# Patient Record
Sex: Female | Born: 1940
Health system: Southern US, Community
[De-identification: ages and names within clinical notes are randomized; demographics above are authoritative.]

## PROBLEM LIST (undated history)

## (undated) DIAGNOSIS — J449 Chronic obstructive pulmonary disease, unspecified: Secondary | ICD-10-CM

## (undated) DIAGNOSIS — J439 Emphysema, unspecified: Secondary | ICD-10-CM

## (undated) DIAGNOSIS — I10 Essential (primary) hypertension: Secondary | ICD-10-CM

## (undated) DIAGNOSIS — J45909 Unspecified asthma, uncomplicated: Secondary | ICD-10-CM

## (undated) DIAGNOSIS — E7800 Pure hypercholesterolemia, unspecified: Secondary | ICD-10-CM

## (undated) DIAGNOSIS — M79672 Pain in left foot: Secondary | ICD-10-CM

## (undated) HISTORY — DX: Essential (primary) hypertension: I10

## (undated) HISTORY — DX: Emphysema, unspecified: J43.9

## (undated) HISTORY — DX: Pain in left foot: M79.672

## (undated) HISTORY — PX: GANGLION CYST EXCISION: SHX1691

## (undated) HISTORY — DX: Pure hypercholesterolemia, unspecified: E78.00

## (undated) HISTORY — DX: Unspecified asthma, uncomplicated: J45.909

## (undated) HISTORY — PX: OTHER SURGICAL HISTORY: SHX169

## (undated) HISTORY — DX: Chronic obstructive pulmonary disease, unspecified: J44.9

## (undated) HISTORY — PX: FOOT SURGERY: SHX648

---

## 1997-07-07 ENCOUNTER — Other Ambulatory Visit: Admission: RE | Admit: 1997-07-07 | Discharge: 1997-07-07 | Payer: Self-pay | Admitting: Obstetrics and Gynecology

## 1999-07-20 ENCOUNTER — Other Ambulatory Visit: Admission: RE | Admit: 1999-07-20 | Discharge: 1999-07-20 | Payer: Self-pay | Admitting: Obstetrics and Gynecology

## 2000-08-04 ENCOUNTER — Other Ambulatory Visit: Admission: RE | Admit: 2000-08-04 | Discharge: 2000-08-04 | Payer: Self-pay | Admitting: Obstetrics and Gynecology

## 2001-05-12 ENCOUNTER — Inpatient Hospital Stay (HOSPITAL_COMMUNITY): Admission: AD | Admit: 2001-05-12 | Discharge: 2001-05-14 | Payer: Self-pay | Admitting: *Deleted

## 2001-05-12 ENCOUNTER — Encounter: Payer: Self-pay | Admitting: *Deleted

## 2001-08-29 ENCOUNTER — Encounter: Payer: Self-pay | Admitting: Gastroenterology

## 2001-09-14 ENCOUNTER — Other Ambulatory Visit: Admission: RE | Admit: 2001-09-14 | Discharge: 2001-09-14 | Payer: Self-pay | Admitting: Obstetrics and Gynecology

## 2002-07-09 ENCOUNTER — Other Ambulatory Visit: Admission: RE | Admit: 2002-07-09 | Discharge: 2002-07-09 | Payer: Self-pay | Admitting: Cardiology

## 2004-03-26 ENCOUNTER — Ambulatory Visit: Payer: Self-pay | Admitting: Internal Medicine

## 2004-03-29 ENCOUNTER — Ambulatory Visit: Payer: Self-pay | Admitting: Internal Medicine

## 2004-04-04 ENCOUNTER — Ambulatory Visit: Payer: Self-pay | Admitting: Internal Medicine

## 2004-04-04 ENCOUNTER — Ambulatory Visit (HOSPITAL_COMMUNITY): Admission: RE | Admit: 2004-04-04 | Discharge: 2004-04-04 | Payer: Self-pay | Admitting: Internal Medicine

## 2004-04-12 ENCOUNTER — Encounter: Admission: RE | Admit: 2004-04-12 | Discharge: 2004-07-11 | Payer: Self-pay | Admitting: Internal Medicine

## 2004-06-25 ENCOUNTER — Ambulatory Visit: Payer: Self-pay | Admitting: Internal Medicine

## 2004-06-29 ENCOUNTER — Ambulatory Visit: Payer: Self-pay | Admitting: Internal Medicine

## 2004-10-04 ENCOUNTER — Ambulatory Visit: Payer: Self-pay | Admitting: Internal Medicine

## 2004-10-12 ENCOUNTER — Ambulatory Visit: Payer: Self-pay | Admitting: Internal Medicine

## 2005-02-07 ENCOUNTER — Ambulatory Visit: Payer: Self-pay | Admitting: Internal Medicine

## 2005-03-05 ENCOUNTER — Ambulatory Visit: Payer: Self-pay | Admitting: Internal Medicine

## 2005-07-05 ENCOUNTER — Ambulatory Visit: Payer: Self-pay | Admitting: Internal Medicine

## 2005-08-01 ENCOUNTER — Ambulatory Visit: Payer: Self-pay | Admitting: Internal Medicine

## 2005-08-02 ENCOUNTER — Ambulatory Visit: Payer: Self-pay | Admitting: Internal Medicine

## 2005-08-05 ENCOUNTER — Ambulatory Visit: Payer: Self-pay | Admitting: Internal Medicine

## 2005-10-22 ENCOUNTER — Ambulatory Visit: Payer: Self-pay | Admitting: Endocrinology

## 2005-10-25 ENCOUNTER — Ambulatory Visit: Payer: Self-pay | Admitting: Internal Medicine

## 2005-10-25 ENCOUNTER — Encounter (INDEPENDENT_AMBULATORY_CARE_PROVIDER_SITE_OTHER): Payer: Self-pay | Admitting: *Deleted

## 2005-10-25 ENCOUNTER — Ambulatory Visit (HOSPITAL_COMMUNITY): Admission: RE | Admit: 2005-10-25 | Discharge: 2005-10-25 | Payer: Self-pay | Admitting: Vascular Surgery

## 2005-10-28 ENCOUNTER — Ambulatory Visit: Payer: Self-pay | Admitting: Internal Medicine

## 2005-11-18 ENCOUNTER — Ambulatory Visit: Payer: Self-pay | Admitting: Internal Medicine

## 2006-03-17 ENCOUNTER — Ambulatory Visit: Payer: Self-pay | Admitting: Internal Medicine

## 2006-03-17 LAB — CONVERTED CEMR LAB
ALT: 19 units/L (ref 0–40)
AST: 22 units/L (ref 0–37)
BUN: 17 mg/dL (ref 6–23)
Basophils Absolute: 0 10*3/uL (ref 0.0–0.1)
Basophils Relative: 0.2 % (ref 0.0–1.0)
Chol/HDL Ratio, serum: 2.1
Cholesterol: 125 mg/dL (ref 0–200)
Creatinine, Ser: 0.8 mg/dL (ref 0.4–1.2)
Eosinophil percent: 1.5 % (ref 0.0–5.0)
HCT: 42.3 % (ref 36.0–46.0)
HDL: 60.4 mg/dL (ref >39.0)
Hemoglobin: 14.1 g/dL (ref 12.0–15.0)
Hgb A1c MFr Bld: 6.4 % — ABNORMAL HIGH (ref 4.6–6.0)
LDL Cholesterol: 54 mg/dL (ref 0–99)
Lymphocytes Relative: 32.4 % (ref 12.0–46.0)
MCHC: 33.4 g/dL (ref 30.0–36.0)
MCV: 91.6 fL (ref 78.0–100.0)
Monocytes Absolute: 0.1 10*3/uL — ABNORMAL LOW (ref 0.2–0.7)
Monocytes Relative: 1.9 % — ABNORMAL LOW (ref 3.0–11.0)
Neutro Abs: 2.4 10*3/uL (ref 1.4–7.7)
Neutrophils Relative %: 64 % (ref 43.0–77.0)
Platelets: 194 10*3/uL (ref 150–400)
RBC: 4.61 M/uL (ref 3.87–5.11)
RDW: 13.9 % (ref 11.5–14.6)
Triglyceride fasting, serum: 53 mg/dL (ref 0–149)
VLDL: 11 mg/dL (ref 0–40)
WBC: 3.9 10*3/uL — ABNORMAL LOW (ref 4.5–10.5)

## 2006-04-02 ENCOUNTER — Encounter: Admission: RE | Admit: 2006-04-02 | Discharge: 2006-04-02 | Payer: Self-pay | Admitting: Internal Medicine

## 2006-04-16 ENCOUNTER — Ambulatory Visit: Payer: Self-pay | Admitting: Internal Medicine

## 2006-11-27 ENCOUNTER — Encounter: Payer: Self-pay | Admitting: *Deleted

## 2006-11-27 DIAGNOSIS — H409 Unspecified glaucoma: Secondary | ICD-10-CM | POA: Insufficient documentation

## 2006-11-27 DIAGNOSIS — E119 Type 2 diabetes mellitus without complications: Secondary | ICD-10-CM

## 2006-11-27 DIAGNOSIS — M316 Other giant cell arteritis: Secondary | ICD-10-CM | POA: Insufficient documentation

## 2006-11-27 DIAGNOSIS — I10 Essential (primary) hypertension: Secondary | ICD-10-CM | POA: Insufficient documentation

## 2006-11-27 DIAGNOSIS — E7800 Pure hypercholesterolemia, unspecified: Secondary | ICD-10-CM | POA: Insufficient documentation

## 2006-11-27 DIAGNOSIS — E118 Type 2 diabetes mellitus with unspecified complications: Secondary | ICD-10-CM | POA: Insufficient documentation

## 2006-11-27 HISTORY — DX: Type 2 diabetes mellitus without complications: E11.9

## 2006-11-27 HISTORY — DX: Essential (primary) hypertension: I10

## 2006-11-27 HISTORY — DX: Pure hypercholesterolemia, unspecified: E78.00

## 2006-11-27 HISTORY — DX: Unspecified glaucoma: H40.9

## 2006-11-27 HISTORY — DX: Other giant cell arteritis: M31.6

## 2007-06-22 ENCOUNTER — Telehealth (INDEPENDENT_AMBULATORY_CARE_PROVIDER_SITE_OTHER): Payer: Self-pay | Admitting: *Deleted

## 2007-07-03 ENCOUNTER — Ambulatory Visit: Payer: Self-pay | Admitting: Internal Medicine

## 2007-07-03 LAB — CONVERTED CEMR LAB
Albumin: 3.9 g/dL (ref 3.5–5.2)
BUN: 16 mg/dL (ref 6–23)
Calcium: 9.2 mg/dL (ref 8.4–10.5)
Cholesterol: 119 mg/dL (ref 0–200)
Creatinine, Ser: 0.7 mg/dL (ref 0.4–1.2)
GFR calc non Af Amer: 89 mL/min
HDL: 43.2 mg/dL (ref >39.0)
Hgb A1c MFr Bld: 7.9 % — ABNORMAL HIGH (ref 4.6–6.0)
LDL Cholesterol: 63 mg/dL (ref 0–99)
Total Bilirubin: 0.6 mg/dL (ref 0.3–1.2)
Total CHOL/HDL Ratio: 2.8
Triglycerides: 64 mg/dL (ref 0–149)
VLDL: 13 mg/dL (ref 0–40)

## 2007-07-09 ENCOUNTER — Telehealth: Payer: Self-pay | Admitting: Internal Medicine

## 2007-07-09 ENCOUNTER — Ambulatory Visit: Payer: Self-pay | Admitting: Internal Medicine

## 2007-07-09 DIAGNOSIS — M79609 Pain in unspecified limb: Secondary | ICD-10-CM

## 2007-07-09 HISTORY — DX: Pain in unspecified limb: M79.609

## 2007-07-15 ENCOUNTER — Ambulatory Visit: Payer: Self-pay

## 2007-07-15 ENCOUNTER — Encounter: Payer: Self-pay | Admitting: Internal Medicine

## 2007-07-19 ENCOUNTER — Encounter: Payer: Self-pay | Admitting: Internal Medicine

## 2007-07-22 ENCOUNTER — Encounter: Payer: Self-pay | Admitting: Internal Medicine

## 2007-08-26 ENCOUNTER — Encounter: Payer: Self-pay | Admitting: Internal Medicine

## 2007-10-22 ENCOUNTER — Encounter: Payer: Self-pay | Admitting: Internal Medicine

## 2007-11-26 ENCOUNTER — Encounter: Payer: Self-pay | Admitting: Internal Medicine

## 2007-12-03 ENCOUNTER — Encounter: Admission: RE | Admit: 2007-12-03 | Discharge: 2007-12-03 | Payer: Self-pay | Admitting: Endocrinology

## 2008-02-03 ENCOUNTER — Encounter: Payer: Self-pay | Admitting: Internal Medicine

## 2008-02-09 ENCOUNTER — Ambulatory Visit: Payer: Self-pay | Admitting: Internal Medicine

## 2008-02-09 DIAGNOSIS — M549 Dorsalgia, unspecified: Secondary | ICD-10-CM | POA: Insufficient documentation

## 2008-02-09 HISTORY — DX: Dorsalgia, unspecified: M54.9

## 2008-04-28 ENCOUNTER — Encounter: Payer: Self-pay | Admitting: Internal Medicine

## 2008-05-28 ENCOUNTER — Ambulatory Visit: Payer: Self-pay | Admitting: *Deleted

## 2008-05-28 DIAGNOSIS — K219 Gastro-esophageal reflux disease without esophagitis: Secondary | ICD-10-CM

## 2008-05-28 HISTORY — DX: Gastro-esophageal reflux disease without esophagitis: K21.9

## 2008-05-29 ENCOUNTER — Encounter (INDEPENDENT_AMBULATORY_CARE_PROVIDER_SITE_OTHER): Payer: Self-pay | Admitting: *Deleted

## 2008-05-29 ENCOUNTER — Ambulatory Visit: Payer: Self-pay | Admitting: Internal Medicine

## 2008-05-29 ENCOUNTER — Inpatient Hospital Stay (HOSPITAL_COMMUNITY): Admission: EM | Admit: 2008-05-29 | Discharge: 2008-05-31 | Payer: Self-pay | Admitting: Emergency Medicine

## 2008-05-30 ENCOUNTER — Encounter (INDEPENDENT_AMBULATORY_CARE_PROVIDER_SITE_OTHER): Payer: Self-pay | Admitting: *Deleted

## 2008-06-22 ENCOUNTER — Ambulatory Visit: Payer: Self-pay | Admitting: Internal Medicine

## 2008-06-22 DIAGNOSIS — K859 Acute pancreatitis without necrosis or infection, unspecified: Secondary | ICD-10-CM | POA: Insufficient documentation

## 2008-06-24 ENCOUNTER — Encounter: Payer: Self-pay | Admitting: Internal Medicine

## 2008-07-21 ENCOUNTER — Ambulatory Visit: Payer: Self-pay | Admitting: Internal Medicine

## 2008-07-21 DIAGNOSIS — R109 Unspecified abdominal pain: Secondary | ICD-10-CM

## 2008-07-21 DIAGNOSIS — R5381 Other malaise: Secondary | ICD-10-CM | POA: Insufficient documentation

## 2008-07-21 DIAGNOSIS — R5383 Other fatigue: Secondary | ICD-10-CM | POA: Insufficient documentation

## 2008-07-21 HISTORY — DX: Unspecified abdominal pain: R10.9

## 2008-07-22 ENCOUNTER — Encounter (INDEPENDENT_AMBULATORY_CARE_PROVIDER_SITE_OTHER): Payer: Self-pay | Admitting: *Deleted

## 2008-07-22 LAB — CONVERTED CEMR LAB
ALT: 16 units/L (ref 0–35)
AST: 20 units/L (ref 0–37)
Alkaline Phosphatase: 43 units/L (ref 39–117)
Basophils Absolute: 0 10*3/uL (ref 0.0–0.1)
Bilirubin, Direct: 0.1 mg/dL (ref 0.0–0.3)
CO2: 32 meq/L (ref 19–32)
Chloride: 99 meq/L (ref 96–112)
Creatinine, Ser: 0.6 mg/dL (ref 0.4–1.2)
Eosinophils Relative: 1.3 % (ref 0.0–5.0)
HCT: 39.5 % (ref 36.0–46.0)
Hemoglobin: 13.6 g/dL (ref 12.0–15.0)
Lipase: 27 units/L (ref 11.0–59.0)
Lymphocytes Relative: 35.2 % (ref 12.0–46.0)
Lymphs Abs: 1.5 10*3/uL (ref 0.7–4.0)
Monocytes Relative: 1.8 % — ABNORMAL LOW (ref 3.0–12.0)
Neutro Abs: 2.7 10*3/uL (ref 1.4–7.7)
Platelets: 175 10*3/uL (ref 150.0–400.0)
Potassium: 3.5 meq/L (ref 3.5–5.1)
RDW: 13.1 % (ref 11.5–14.6)
Total Bilirubin: 0.5 mg/dL (ref 0.3–1.2)
Total Protein: 7.3 g/dL (ref 6.0–8.3)
WBC: 4.4 10*3/uL — ABNORMAL LOW (ref 4.5–10.5)

## 2008-08-26 ENCOUNTER — Encounter: Payer: Self-pay | Admitting: Internal Medicine

## 2008-09-16 ENCOUNTER — Encounter: Payer: Self-pay | Admitting: Internal Medicine

## 2008-12-09 ENCOUNTER — Ambulatory Visit: Payer: Self-pay | Admitting: Internal Medicine

## 2008-12-09 DIAGNOSIS — G4710 Hypersomnia, unspecified: Secondary | ICD-10-CM | POA: Insufficient documentation

## 2008-12-09 HISTORY — DX: Hypersomnia, unspecified: G47.10

## 2008-12-21 ENCOUNTER — Ambulatory Visit: Payer: Self-pay | Admitting: Pulmonary Disease

## 2009-01-05 ENCOUNTER — Ambulatory Visit: Payer: Self-pay | Admitting: Gastroenterology

## 2009-01-05 DIAGNOSIS — R198 Other specified symptoms and signs involving the digestive system and abdomen: Secondary | ICD-10-CM | POA: Insufficient documentation

## 2009-01-10 ENCOUNTER — Encounter: Payer: Self-pay | Admitting: Gastroenterology

## 2009-01-10 ENCOUNTER — Ambulatory Visit: Payer: Self-pay | Admitting: Gastroenterology

## 2009-01-11 ENCOUNTER — Ambulatory Visit (HOSPITAL_BASED_OUTPATIENT_CLINIC_OR_DEPARTMENT_OTHER): Admission: RE | Admit: 2009-01-11 | Discharge: 2009-01-11 | Payer: Self-pay | Admitting: Pulmonary Disease

## 2009-01-11 ENCOUNTER — Encounter: Payer: Self-pay | Admitting: Pulmonary Disease

## 2009-01-13 ENCOUNTER — Encounter: Payer: Self-pay | Admitting: Gastroenterology

## 2009-01-23 ENCOUNTER — Ambulatory Visit: Payer: Self-pay | Admitting: Pulmonary Disease

## 2009-01-24 ENCOUNTER — Telehealth (INDEPENDENT_AMBULATORY_CARE_PROVIDER_SITE_OTHER): Payer: Self-pay | Admitting: *Deleted

## 2009-02-03 ENCOUNTER — Ambulatory Visit: Payer: Self-pay | Admitting: Pulmonary Disease

## 2009-02-03 DIAGNOSIS — G4733 Obstructive sleep apnea (adult) (pediatric): Secondary | ICD-10-CM | POA: Insufficient documentation

## 2009-02-03 HISTORY — DX: Obstructive sleep apnea (adult) (pediatric): G47.33

## 2009-03-10 ENCOUNTER — Ambulatory Visit: Payer: Self-pay | Admitting: Pulmonary Disease

## 2009-03-10 ENCOUNTER — Encounter: Payer: Self-pay | Admitting: Internal Medicine

## 2009-03-23 ENCOUNTER — Encounter: Payer: Self-pay | Admitting: Pulmonary Disease

## 2009-04-18 ENCOUNTER — Telehealth: Payer: Self-pay | Admitting: Internal Medicine

## 2009-06-02 ENCOUNTER — Encounter: Payer: Self-pay | Admitting: Internal Medicine

## 2009-06-12 ENCOUNTER — Encounter: Admission: RE | Admit: 2009-06-12 | Discharge: 2009-06-12 | Payer: Self-pay | Admitting: Endocrinology

## 2009-06-20 ENCOUNTER — Ambulatory Visit: Payer: Self-pay | Admitting: Internal Medicine

## 2009-07-07 ENCOUNTER — Ambulatory Visit: Payer: Self-pay | Admitting: Internal Medicine

## 2009-07-14 ENCOUNTER — Encounter: Payer: Self-pay | Admitting: Internal Medicine

## 2009-07-14 ENCOUNTER — Ambulatory Visit: Payer: Self-pay | Admitting: Internal Medicine

## 2009-08-17 ENCOUNTER — Encounter: Payer: Self-pay | Admitting: Internal Medicine

## 2009-08-28 ENCOUNTER — Encounter: Payer: Self-pay | Admitting: Internal Medicine

## 2009-11-24 ENCOUNTER — Ambulatory Visit: Payer: Self-pay | Admitting: Internal Medicine

## 2009-11-24 DIAGNOSIS — J069 Acute upper respiratory infection, unspecified: Secondary | ICD-10-CM | POA: Insufficient documentation

## 2009-12-07 ENCOUNTER — Encounter: Payer: Self-pay | Admitting: Internal Medicine

## 2009-12-11 ENCOUNTER — Telehealth: Payer: Self-pay | Admitting: Internal Medicine

## 2010-03-04 HISTORY — PX: COLONOSCOPY: SHX174

## 2010-03-16 ENCOUNTER — Encounter: Payer: Self-pay | Admitting: Internal Medicine

## 2010-03-20 ENCOUNTER — Encounter: Payer: Self-pay | Admitting: Internal Medicine
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
## 2010-04-05 NOTE — Letter (Signed)
Summary: Reather Littler MD  Reather Littler MD   Imported By: Lester [place] 06/08/2009 08:40:48  _____________________________________________________________________  External Attachment:    Type:   Image     Comment:   External Document

## 2010-04-05 NOTE — Progress Notes (Signed)
Summary: Omeprazole  Phone Note Call from Patient Call back at [phone]   Summary of Call: Patient is requesting to stop omeprazole. She feels she does not need it anymore.  Initial call taken by: Lamar Sprinkles, CMA,  April 18, 2009 3:01 PM  Follow-up for Phone Call        OK to stop. She can use otc zantac 150 as needed. She may experience some rebound reflux with stopping of the Omeprazole Follow-up by: Jacques Navy MD,  April 18, 2009 6:37 PM  Additional Follow-up for Phone Call Additional follow up Details #1::        Patient notified. Additional Follow-up by: Lucious Groves,  April 19, 2009 8:41 AM

## 2010-04-05 NOTE — Miscellaneous (Signed)
Summary: BONE DENSITY  Clinical Lists Changes  Orders: Added new Test order of T-Bone Densitometry (77080) - Signed Added new Test order of T-Lumbar Vertebral Assessment (77082) - Signed 

## 2010-04-05 NOTE — Letter (Signed)
Summary: Alyse Low,  MD  Alyse Low,  MD   Imported By: Lester [place] 03/16/2009 09:59:53  _____________________________________________________________________  External Attachment:    Type:   Image     Comment:   External Document

## 2010-04-05 NOTE — Progress Notes (Signed)
    Immunization History:  Influenza Immunization History:    Influenza:  historical (12/07/2009)

## 2010-04-05 NOTE — Miscellaneous (Signed)
Summary: Flu vax/Ajay Lucianne Muss MD  Flu Sheldon Silvan MD   Imported By: Lester [place] 12/13/2009 10:38:51  _____________________________________________________________________  External Attachment:    Type:   Image     Comment:   External Document

## 2010-04-05 NOTE — Assessment & Plan Note (Signed)
Summary: rov for osa/cpap trial   Primary Provider/Referring Provider:  Jacques Navy,  MD  CC:  Pt is here for a routine f/u appt since starting cpap.  Pt states she is not wearing her cpap machine every night.  Pt states it's "too uncomfortable."  Pt states mask makes her feel "congested."   Pt denied any complaints with pressure. Marland Kitchen  History of Present Illness: The pt comes in today for f/u of her cpap trial for osa.  She has been wearing as much as possible, but admits that she doesn't wear everynight.  She is having issues with having something on her face, but feels that it is getting better the more she wears it.  She also is having issues with too much moisture in the system, but unfortunately her dme did not show her how to adjust heated humidifier.  She does think the cpap helps her sleep and daytime symptoms when she wears it.  Medications Prior to Update: 1)  Amlodipine Besylate 5 Mg Tabs (Amlodipine Besylate) .Marland Kitchen.. 1 By Mouth Once Daily 2)  Aspirin 325 Mg  Tbec (Aspirin) .... Take 1 By Mouth Qd 3)  Lisinopril-Hydrochlorothiazide 20-12.5 Mg Tabs (Lisinopril-Hydrochlorothiazide) .Marland Kitchen.. 1 By Mouth Once Daily 4)  Omeprazole 40 Mg Cpdr (Omeprazole) .Marland Kitchen.. 1 By Mouth Qam For Gerd 5)  Lipitor 20 Mg Tabs (Atorvastatin Calcium) .Marland Kitchen.. 1 By Mouth Q Pm 6)  Glimepiride 1 Mg Tabs (Glimepiride) .... Take 1 By Mouth Qd 7)  Actoplus Met 15-850 Mg Tabs (Pioglitazone Hcl-Metformin Hcl) .... Take 1 Two Times A Day 8)  Xalatan 0.005 % Soln (Latanoprost) .Marland Kitchen.. 1 Drop Each Eye Qhs 9)  Pataday 0.2 % Soln (Olopatadine Hcl) .Marland Kitchen.. 1 Drop in Each Eye As Needed For Allergies 10)  Triple Omega-3-6-9  Caps (Omega 3-6-9 Fatty Acids) .... One Tablet By Mouth Two Times A Day  Allergies (verified): No Known Drug Allergies  Review of Systems      See HPI  Vital Signs:  Patient profile:   70 year old female Height:      61 inches Weight:      177.38 pounds BMI:     33.64 O2 Sat:      92 % on Room air Temp:      98.0 degrees F oral Pulse rate:   96 / minute BP sitting:   132 / 74  (right arm) Cuff size:   regular  Vitals Entered By: Arman Filter LPN (March 10, 2009 2:02 PM)  O2 Flow:  Room air CC: Pt is here for a routine f/u appt since starting cpap.  Pt states she is not wearing her cpap machine every night.  Pt states it's "too uncomfortable."  Pt states mask makes her feel "congested."   Pt denied any complaints with pressure.  Comments Medications reviewed with patient Arman Filter LPN  March 10, 2009 2:02 PM    Physical Exam  General:  ow female in nad Nose:  no skin breakdown or pressure necrosis from cpap mask Neurologic:  alert, but mildly sleepy moves all 4.   Impression & Recommendations:  Problem # 1:  OBSTRUCTIVE SLEEP APNEA (ICD-327.23) the pt is slow to get adjusted to cpap, but would like more time since she feels is getting better.  I would like to try her on the auto mode to see if more comfortable for her, and also to optimize her pressure.  I will also get dme to show her how to operate humidifier.  I have also encouraged her to work on weight loss.  Time spent with pt today was .  Other Orders: Est. Patient Level III ([address]) DME Referral (DME)  Patient Instructions: 1)  keep working on cpap 2)  will use auto mode for the next 2 weeks to see if more comfortable for you. 3)  turn down heat on humidifier to see if helps with less moisture 4)  please call me in 2 weeks and let me know how things are going.

## 2010-04-05 NOTE — Letter (Signed)
   [facility] [address] [place], [place]  [address] Phone: [phone]      August 17, 2009   [facility] [address] [place], [place] [address]  RE:  LAB RESULTS  Dear  Ms. Leeb,  The following is an interpretation of your most recent lab tests.  Please take note of any instructions provided or changes to medications that have resulted from your lab work.     Bone density study revealed normal bone density   Call or e-mail me if you have questions ([email or web address]).   Sincerely Yours,    Jacques Navy MD

## 2010-04-11 ENCOUNTER — Encounter: Payer: Self-pay | Admitting: Internal Medicine

## 2010-04-11 NOTE — Procedures (Signed)
Summary: Colonoscopy / [facility]  Colonoscopy / [facility]   Imported By: Lennie Odor 04/05/2010 13:58:41  _____________________________________________________________________  External Attachment:    Type:   Image     Comment:   External Document

## 2010-04-19 NOTE — Letter (Signed)
Summary: [facility]   Imported By: Sherian Rein 04/12/2010 12:45:36  _____________________________________________________________________  External Attachment:    Type:   Image     Comment:   External Document

## 2010-05-01 NOTE — Letter (Signed)
Summary: Shelly Rubenstein MD  Shelly Rubenstein MD   Imported By: Lester [place] 04/24/2010 07:43:26  _____________________________________________________________________  External Attachment:    Type:   Image     Comment:   External Document

## 2010-06-06 LAB — GLUCOSE, CAPILLARY: Glucose-Capillary: 80 mg/dL (ref 70–99)

## 2010-06-14 LAB — GLUCOSE, CAPILLARY
Glucose-Capillary: 84 mg/dL (ref 70–99)
Glucose-Capillary: 85 mg/dL (ref 70–99)
Glucose-Capillary: 95 mg/dL (ref 70–99)

## 2010-06-14 LAB — LIPID PANEL
Cholesterol: 115 mg/dL (ref 0–200)
LDL Cholesterol: 60 mg/dL (ref 0–99)

## 2010-06-14 LAB — HEMOGLOBIN A1C
Hgb A1c MFr Bld: 6 % (ref 4.6–6.1)
Mean Plasma Glucose: 126 mg/dL

## 2010-06-14 LAB — URINALYSIS, ROUTINE W REFLEX MICROSCOPIC
Glucose, UA: NEGATIVE mg/dL
Ketones, ur: NEGATIVE mg/dL
Leukocytes, UA: NEGATIVE
Nitrite: NEGATIVE
Specific Gravity, Urine: 1.015 (ref 1.005–1.030)
pH: 5 (ref 5.0–8.0)

## 2010-06-14 LAB — URINE MICROSCOPIC-ADD ON

## 2010-06-14 LAB — MAGNESIUM: Magnesium: 1.7 mg/dL (ref 1.5–2.5)

## 2010-06-14 LAB — DIFFERENTIAL
Basophils Relative: 0 % (ref 0–1)
Eosinophils Absolute: 0 10*3/uL (ref 0.0–0.7)
Eosinophils Absolute: 0 10*3/uL (ref 0.0–0.7)
Lymphs Abs: 1.1 10*3/uL (ref 0.7–4.0)
Lymphs Abs: 1.4 10*3/uL (ref 0.7–4.0)
Monocytes Relative: 4 % (ref 3–12)
Monocytes Relative: 5 % (ref 3–12)
Neutro Abs: 4.1 10*3/uL (ref 1.7–7.7)
Neutro Abs: 5.8 10*3/uL (ref 1.7–7.7)
Neutrophils Relative %: 75 % (ref 43–77)
Neutrophils Relative %: 77 % (ref 43–77)

## 2010-06-14 LAB — POCT I-STAT, CHEM 8
Calcium, Ion: 1.13 mmol/L (ref 1.12–1.32)
Chloride: 100 mEq/L (ref 96–112)
Glucose, Bld: 101 mg/dL — ABNORMAL HIGH (ref 70–99)
HCT: 46 % (ref 36.0–46.0)
Hemoglobin: 15.6 g/dL — ABNORMAL HIGH (ref 12.0–15.0)
TCO2: 28 mmol/L (ref 0–100)

## 2010-06-14 LAB — CBC
MCV: 91.8 fL (ref 78.0–100.0)
MCV: 91.8 fL (ref 78.0–100.0)
Platelets: 190 10*3/uL (ref 150–400)
Platelets: 217 10*3/uL (ref 150–400)
RBC: 4.07 MIL/uL (ref 3.87–5.11)
RBC: 4.61 MIL/uL (ref 3.87–5.11)
WBC: 5.5 10*3/uL (ref 4.0–10.5)
WBC: 7.6 10*3/uL (ref 4.0–10.5)

## 2010-06-14 LAB — BASIC METABOLIC PANEL
BUN: 6 mg/dL (ref 6–23)
Calcium: 8.2 mg/dL — ABNORMAL LOW (ref 8.4–10.5)
Chloride: 108 mEq/L (ref 96–112)
Creatinine, Ser: 0.56 mg/dL (ref 0.4–1.2)
GFR calc Af Amer: >60 mL/min (ref >60)
GFR calc non Af Amer: >60 mL/min (ref >60)

## 2010-06-14 LAB — HEPATIC FUNCTION PANEL
ALT: 18 U/L (ref 0–35)
Albumin: 3.9 g/dL (ref 3.5–5.2)
Indirect Bilirubin: 0.5 mg/dL (ref 0.3–0.9)
Total Protein: 7.2 g/dL (ref 6.0–8.3)

## 2010-06-14 LAB — LIPASE, BLOOD: Lipase: 2955 U/L — ABNORMAL HIGH (ref 11–59)

## 2010-06-14 LAB — AMYLASE: Amylase: 1098 U/L — ABNORMAL HIGH (ref 27–131)

## 2010-07-17 NOTE — H&P (Signed)
Angel Tanner, Angel Tanner              ACCOUNT NO.:  [id number]   MEDICAL RECORD NO.:  [id number]          PATIENT TYPE:  INP   LOCATION:  1824                         FACILITY:  [facility]   PHYSICIAN:  Darryl D. Prime, MD    DATE OF BIRTH:  09-25-1940   DATE OF ADMISSION:  05/29/2008  DATE OF DISCHARGE:                              HISTORY & PHYSICAL   The patient is a Full Code.   PRIMARY CARE PHYSICIAN:  Dr. Debby Bud.   CHIEF COMPLAINT:  Abdominal pain.   HISTORY OF PRESENT ILLNESS:  Angel Tanner is a 69 year old female with a  history of diabetes and hypertension.  She has a history of  hyperlipidemia.  She presents with abdominal pain.  She notes mid  abdominal pain since Wednesday, which is 5 days prior to admission,  where initially mild, but she notes it is a cramping sensation with  episodes of severe pain but the pain never goes away.  She notes  radiation of this pain to the upper epigastrium, very severe a few  minutes after eating every single time.  This has gotten to the point  where she has decreased her p.o. intake considerably.  She has had  significant nausea starting on last night with no vomiting.  She notes  one watery diarrhea on Wednesday when the symptoms began but none since,  no fever, no constipation.  In the emergency room patient was found to  have a lipase of 2955 and an amylase of 1098 and was given IV fluids,  morphine and Zofran.  The patient did see someone in clinic on the day  prior to admission and was given medications for reflux.  The patient  notes the last medication that was recently started with Janumet 50/100  twice a day which she has been on for the last 6 months.   PAST MEDICAL HISTORY/PAST SURGICAL HISTORY:  1. Diabetes.  2. She has a history of chest pain syndrome with left heart      catheterization in March 2003 showing nonobstructive disease.  EF      of 69%.  3. History of hypertension.  4. History of glaucoma.  5. History of  hyperlipidemia.  6. She had a C-section 14 years ago.   ALLERGIES:  No known drug allergies.   SOCIAL HISTORY:  Positive for tobacco abuse greater than 40 years 1 pack  per day.  No alcohol or illicit drug use.  She has never drank alcohol.   FAMILY HISTORY:  No family history of GI cancers.   MEDICATIONS:  1. Lisinopril/hydrochlorothiazide 20/12.5 daily.  2. Pataday 0.2% one drop as needed for allergies of the eyes.  3. Amlodipine 5 mg daily.  4. Aspirin 325 mg daily.  5. Lipitor 20 mg daily.  6. Travatan 0.04% one drop to each eye nightly.  7. Kapidex 60 mg, she is taking 2 doses.  8. She is on Janumet 50/100 mg twice a day.   ALLERGIES:  No known drug allergies.   REVIEW OF SYSTEMS:  A 14-point review of systems negative unless stated  above.   PHYSICAL EXAMINATION:  VITAL SIGNS:  Temperature is 98.3 with a pulse  90, respiratory rate of 18, saturations are 98% on room air, blood  pressure 132/88.  GENERAL:  She is a mildly obese sitting upright in bed in no acute  distress.  HEENT:  Normocephalic, atraumatic.  Pupils are equal, round and reactive  to light with extraocular movements being intact.  The oropharynx shows  no posterior oropharyngeal lesion, it is dry.  NECK:  Supple with no lymphadenopathy or thyromegaly.  No carotid  bruits.  ABDOMEN:  Soft with mild tenderness to palpation but normoactive bowel  sounds.  No signs of hepatosplenomegaly.  No rebound tenderness or  guarding.  LUNGS:  Clear to auscultation bilaterally.  CARDIOVASCULAR:  Regular rhythm and rate with no murmurs.  EXTREMITIES:  Show no clubbing, cyanosis or edema.  NEUROLOGIC:  She is alert and oriented x4.  Cranial nerves II-XII  grossly intact.  Strength and sensation grossly intact.  SKIN:  Shows no rash.   LABORATORY DATA:  Shows a sodium of 138 with a potassium of 3.5,  chloride 100, bicarbonate 28, BUN 11, creatinine 1.0, glucose 101.  Hepatic function panel was normal except for the  low alkaline  phosphatase which was 33.  White count is 7.0 with a hemoglobin of 14.2,  hematocrit 42.4, platelets 217 with segs of 77 and lymphocytes 19.  Ultrasound of the abdomen shows no ductal dilatation, no biliary ductal  dilatation.  She had a small hyperechoic focus in the area of the left  kidney consistent with possible angiomyolipoma, there were no signs of  gallbladder disease.  Lipase as above, amylase as above.  Urinalysis  showed for PVCs 3-5, otherwise negative.   ASSESSMENT/PLAN:  This is a patient with a history of diabetes and  hypertension who now presents with pancreatitis of unclear etiology.  She does not drink.  Ultrasound of the abdomen is  benign.  At this time  the potential etiology includes gallbladder sludge, it also includes  medications including hydrochlorothiazide or Janumet, but my major  concern is the Janumet is now causing possible pancreatitis.  At this  time we will have her nothing by mouth and will give intravenous fluids  for bowel rest.  The patient's Janumet will be held.  Will get a CT of  the abdomen to evaluate her anatomy and for possible complications of  pancreatitis.  Will control her pain analgesics and control her nausea  with antiemetics.  The patient, for her diabetes, will be on sliding  scale insulin for now and will give low-dose D5W drip to prevent  ketosis.  For her hypertension, we will order antihypertensives for now.  She does have signs of significant hypovolemia.  Intravenous fluids will  be given for the hypovolemia.  Deep vein thrombosis and gastrointestinal  prophylaxis will be started.      Darryl D. Prime, MD  Electronically Signed     DDP/MEDQ  D:  05/29/2008  T:  05/29/2008  Job:  [id number]

## 2010-07-17 NOTE — Discharge Summary (Signed)
Angel Tanner, Angel Tanner              ACCOUNT NO.:  [id number]   MEDICAL RECORD NO.:  [id number]          PATIENT TYPE:  INP   LOCATION:  5017                         FACILITY:  [facility]   PHYSICIAN:  Valerie A. Felicity Coyer, MDDATE OF BIRTH:  September 15, 1940   DATE OF ADMISSION:  05/29/2008  DATE OF DISCHARGE:  05/31/2008                               DISCHARGE SUMMARY   DISCHARGE DIAGNOSES:  1. Acute pancreatitis likely medication induced other evaluation      negative.  See details below.  Symptoms resolved.  2. Type 2 diabetes, question diet-controlled hemoglobin A1c is 6.0      stopping medications at this time.  See details below.  3. Hypokalemia.  4. Incidental left renal angiolipoma on CT no further evaluation.  5. Hypertension.  6. Dyslipidemia.   DISCHARGE MEDICATIONS:  1. Discontinuation of Janumet 50/1000 p.o. b.i.d.  2. Hold on Lipitor 20 mg p.o. daily until followup with primary MD.   OTHER MEDICATIONS:  As prior to admission and include:  1. Norvasc 5 mg daily.  2. Aspirin 325 mg daily.  3. Travatan 0.004% solution at bedtime.  4. Triamcinolone ointment b.i.d. p.r.n.  5. Lisinopril and hydrochlorothiazide 20/12.5 p.o. daily.  6. Betadine allergy eye drops p.r.n. allergy symptoms.   DISPOSITION:  The patient is discharged home tolerating a regular low-  fat diet without complicating features of pain and has been asymptomatic  for greater than 48 hours.  Hospital followup is with primary care  physician, Dr. Illene Regulus scheduled for Monday June 20, 2008 at  4:20 p.m., also with endocrinologist, Dr. Cindie Laroche as previously scheduled  for further evaluation and review of treatment options of diabetes.   CONDITION ON DISCHARGE:  Medically improved and stable.   HOSPITAL COURSE:  1. Acute pancreatitis.  The patient is a 70 year old woman with      multiple chronic medical issues who recently began Janumet for      treatment of her diabetes.  She had the onset of severe  abdominal      pain which has increased over the 5 days prior to admission but      became persistently constant and severe.  She came for evaluation      in the emergency room and was found to have a lipase level of      nearly 3000 and an amylase level of 1100 consistent with acute      pancreatitis.  She underwent a CT scan of the abdomen and pelvis      which did show inflammatory changes but no complicating features of      acute pancreatitis.  Abdominal ultrasound also performed that      showed no evidence of cholelithiasis or mass.  She was treated with      IV fluids, n.p.o. bowel rest and symptomatic pain medication.  Her      symptoms quickly resolved and by the following day she had no pain      and was ready for a liquid diet which was then advanced to low-fat      diet without recurrence of the pain  symptoms.  Reviewing the      patient's risk factors for acute pancreatitis, she does not drink.      Her triglycerides have been well controlled.  It demonstrated a      fasting lipid profile with triglycerides in the 50s.  No evidence      of trauma and no evidence of stones.  The patient's medications      were then carefully reviewed and though there are several drugs      which may cause pancreatitis symptoms, it was felt that given      chronologic proximity of recent initiation of Janumet that this was      the most likely offending cause and this medication was      subsequently discontinued.  I have also asked her to hold her      Lipitor until further followup with her primary MD.  2. For treatment of her dyslipidemia as her fasting lipid profile      shows good control, we will carefully resume lisinopril and      hydrochlorothiazide at this time noting also that this medication      may be contributing to her pancreatitis symptoms but she has been      on this for a long while without complication until this time.  The      patient has been well controlled regarding  her diabetes with CBGs      under 100 throughout this hospitalization.  The diet has been      advanced and not even in need of sliding scale insulin.  Her A1c is      6.0 albeit on medication prior to this time and question need for      long-term treatment other than diet control of her diabetes.  This      has been carefully reviewed with the patient, need for a low-carb      diet until followup with her endocrinologist as well as low-fat      diet to avoid stimulation of the pancreas in the acute setting.      She is felt medically stable for discharge home having resolved      acute issues with close outpatient followup to be arranged with her      primary MD as I described here.  She has follow up with her      endocrinologist as scheduled. I will bring a copy of her medication      list for followup at that time.   Over 30 minutes on day of discharge coordination for the patient  evaluation, chart review, preparation of meds and diet discussion.      Valerie A. Felicity Coyer, MD  Electronically Signed     VAL/MEDQ  D:  05/31/2008  T:  06/01/2008  Job:  [id number]

## 2010-07-20 NOTE — Discharge Summary (Signed)
[place]  Patient:    MOREEN, PIGGOTT Visit Number: [id number] MRN: [id number]          Service Type: MED Location: [phone] Attending Physician:  Glennon Hamilton Dictated by:   Joellyn Rued, P.A.-C. Admit Date:  05/12/2001 Discharge Date: 05/14/2001   CC:         Rosalyn Gess. Norins, M.D. [facility]   Referring Physician Discharge [place]  DATE OF BIRTH:  April 03, 1941  SUMMARY OF HISTORY:  Ms. Michna is a 70 year old black female who was referred with dyspnea on exertion and precordial tightness associated with exertion over the preceding month.  Prior to these episodes she had been able to exercise 30 minutes on the treadmill without difficulty.  However, recently, with walking short distances or stair climbing she becomes short of breath and has substernal chest discomfort, occasionally radiating to her right elbow. No associated nausea, vomiting, diaphoresis, or dizziness.  Her history includes tobacco use, hypertension, hyperlipidemia, a strong family history, and obesity.  LABORATORY DATA:  Admission chest x-ray showed mild prominence of the ascending aorta, left basilar atelectasis.  H&H was 14.4 and 42.7, normal indices, platelets 190, wbcs 7.0.  Homocysteine 11.16.  TSH 1.017.  Lipids showed total cholesterol 172, triglycerides 51, HDL 49, LDL 113.  Sodium 135, potassium 3.8, BUN 11, creatinine 0.7, glucose slightly elevated at 128.  EKG showed normal sinus rhythm, delayed R wave, nonspecific ST-T wave changes.  HOSPITAL COURSE:  Ms. Miguez was admitted to [facility].  She was placed on IV heparin as well as beta blocker and aspirin.  Overnight she did not have any further chest discomfort and was scheduled for cardiac catheterization.  An ACE inhibitor was added for her blood pressure of 160/90. On March 13 she underwent cardiac catheterization by Dr. Antoine Poche.  This showed an LV pressure of 155/29, aorta pressure 155/88.   She had some luminal irregularities in her LAD; otherwise, she did not have any coronary artery disease.  Her EF was 69% without wall motion abnormalities.  It was felt that her chest discomfort was not related to cardiac ischemia and Dr. Antoine Poche felt that she could be discharged home on her medications prior to admission.  Her chest x-ray was slightly abnormal with a mild prominence of the ascending aorta.  However, Dr. Antoine Poche felt that this did not need further evaluation at this time.  DISCHARGE DIAGNOSES: 1. Chest discomfort of unknown etiology. 2. Hypertension. 3. History as previously.  DISPOSITION:  She is discharged home.  MEDICATIONS:  She is asked to resume: 1. HCTZ 12.5 mg a day. 2. Xalatan eyedrops. 3. Coated aspirin 325 q.d. 4. Ogen as previously.  FOLLOW-UP:  She will follow up with Dr. Debby Bud on May 28, 2001 at 10:30 a.m.  ACTIVITY:  She was advised no lifting, driving, sexual activity, or heavy exertion for two days.  DIET:  Maintain low salt/fat/cholesterol diet.  WOUND CARE:  If she had any problems with her catheterization site she was asked to call.  SPECIAL INSTRUCTIONS:  We also recommended to consider discontinuing smoking. Dictated by:   Joellyn Rued, P.A.-C. Attending Physician:  Glennon Hamilton DD:  05/14/01 TD:  05/14/01 Job: [id number]

## 2010-07-20 NOTE — Cardiovascular Report (Signed)
[place]. [facility]  Patient:    Angel Tanner, Angel Tanner Visit Number: [id number] MRN: [id number]          Service Type: MED Location: [phone] Attending Physician:  Glennon Hamilton Dictated by:   Rollene Rotunda, M.D. [facility] Proc. Date: 05/14/01 Admit Date:  05/12/2001 Discharge Date: 05/14/2001   CC:         Rosalyn Gess. Norins, M.D. [facility]   Cardiac Catheterization  DATE OF BIRTH: [birth year]  PRIMARY PHYSICIAN: Rosalyn Gess. Norins, M.D.  PROCEDURE: Left heart cardiac catheterization/coronary arteriography.  INDICATIONS: Evaluate patient with chest pain suggestive of unstable angina.  DESCRIPTION OF PROCEDURE: Left heart catheterization was performed via the right femoral artery.  The artery was cannulated using an anterior wall puncture.  A #6 French arterial sheath was inserted via the modified Seldinger technique.  Preformed Judkins and a pigtail catheter were utilized.  The patient tolerated the procedure well and left the lab in stable condition.  RESULTS:  HEMODYNAMICS: LV 155/29, AO 155/88.  CORONARY ARTERIOGRAPHY: Left main: The left main was normal.  Left anterior descending: The LAD had luminal irregularities.  Circumflex: The circumflex coronary artery was normal.  Right coronary artery: The right coronary artery was a large dominant vessel. It had luminal irregularities.  LEFT VENTRICULOGRAM: The left ventriculogram was obtained in the RAO projection.  The EF was 65% with normal wall motion.  CONCLUSION: Minimal coronary artery plaquing. Normal left ventricular function.  PLAN: The patient will follow with Dr. Debby Bud. I have discussed the results with him. If she continues to have dyspnea she will have further work-up per Dr. Debby Bud. Dictated by:   Rollene Rotunda, M.D. [facility] Attending Physician:  Glennon Hamilton DD:  05/14/01 TD:  05/15/01 Job: [id number]

## 2010-07-20 NOTE — Assessment & Plan Note (Signed)
[facility]                             PRIMARY CARE OFFICE NOTE   NAME:Angel Tanner, Angel Tanner                     MRN:          [id number]  DATE:10/28/2005                            DOB:          14-Jul-1940    REASON FOR VISIT:  Angel Tanner had an acute episode of illness and was seen  at the [facility] on October 17, 2005.  At that time, there  was concern for pneumonia and possibly temporal arteritis because of fevers,  sweats, chills, myalgia, and temporal headache.  Chest x-ray was done as was  blood work.  The patient had an ESR of 94, a white count that was normal at  10.2.  She was started on Levaquin and prednisone at 40 mg daily.  Final  reading from the radiologist on her chest x-ray was normal and clear, with  no evidence of pneumonia.   The patient returned to [facility] on October 21, 2005.  At that time,  her sed rate was down to 48.  She was told to continue on prednisone for  probable temporal arteritis and was subsequently referred back to [facility] to get scheduled for a temporal artery biopsy.   The patient did see Dr. Romero Belling on October 22, 2005 who did get her set  up with Dr. Edilia Bo for temporal artery biopsy that was performed on October 25, 2005 at [facility] outpatient.  The patient also had had her  prednisone tapered down.  Furthermore, she went to see Dr. Stacey Drain  who was not convinced of the diagnosis on clinical grounds and her reduce  her prednisone further.  Of note, the patient's prednisone had caused her to  have significant hyperglycemia, with a serum glucose on October 22, 2005 of  453.  Dr. Everardo All saw the patient on October 25, 2005 and put her on Actos  with metformin 15/500 t.i.d.  The patient returns now for followup.   The patient reports that she is actually feeling pretty well.  She has had  some tough times when her blood sugar was very high, but that seems  to be  improved.  She has done well with her surgical biopsy, with no sequelae.   I spoke with Dr. Darrick Penna, pathologist, who said that the full report is  pending but she recalls that the specimens showed no evidence of any giant  cell arteritis in either temporal artery biopsy specimen.   PHYSICAL EXAMINATION:  VITAL SIGNS:  Temperature 98.3, blood pressure  120/79, pulse 77.  GENERAL:  This is a well-nourished African American woman who is in no acute  distress.  She has very nice matching scars at the temporal level  bilaterally which look like they are healing well.   ASSESSMENT AND PLAN:  1. Temporal arteritis.  No convincing evidence of temporal arteritis with      a negative temporal artery biopsy bilaterally.  Final pathology report      is pending, but Dr. Darrick Penna was fairly convinced that there was no      evidence  of giant cell arteritis.  Plan:  The patient is to stop      prednisone.  2. Diabetes.  The patient's blood sugars were markedly elevated by the      steroids.  I suspect this will stabilize over time.  For now, she will      continue on the Actos/metformin combination, and samples were provided.      I would like to see her back in two weeks for followup and will      consider readjusting her medications, particularly if her blood sugars      come down under better control.  It is possible that this could      continue for several weeks.  The patient will also continue Januvia at      this time at 100 mg daily.                                   Rosalyn Gess Norins, MD   MEN/MedQ  DD:  10/28/2005  DT:  10/29/2005  Job #:  [id number]   cc:   Aundra Dubin, MD  Sean A. Everardo All, MD  Jerald Kief, M.D.

## 2010-07-20 NOTE — Op Note (Signed)
NAME:  Angel Tanner, Angel Tanner              ACCOUNT NO.:  [id number]   MEDICAL RECORD NO.:  [id number]          PATIENT TYPE:  AMB   LOCATION:  SDS                          FACILITY:  [facility]   PHYSICIAN:  Di Kindle. Edilia Bo, M.D.DATE OF BIRTH:  1941-01-08   DATE OF PROCEDURE:  10/25/2005  DATE OF DISCHARGE:                                 OPERATIVE REPORT   PREOPERATIVE DIAGNOSIS:  Headaches and possible temporal arteritis.   POSTOPERATIVE DIAGNOSIS:  Headaches and possible temporal arteritis.   PROCEDURE:  Bilateral temporal artery biopsies.   SURGEON:  Di Kindle. Edilia Bo, M.D.   ASSISTANT:  Nurse.   ANESTHESIA:  Local with sedation.   TECHNIQUE:  The patient was taken to the operating room and the Doppler was  used to mark the location of both cerebral arteries.  The forehead was  prepped and draped in usual sterile fashion.  With the head turned to the  left, the skin was anesthetized with 1% lidocaine and an oblique incision  was made over the temporal artery and the dissection carried down to the  artery which was controlled with a 3-0 silk tie.  Approximately 3 cm of the  artery was dissected out with branches cauterized.  This was sent to  pathology.  Hemostasis was obtained in the wound.  The wound was closed with  a deep layer of 3-0 Vicryl.  The skin closed with 4-0 Vicryl.  Next, the  patient's head was turned to the right and the skin in the left temple area  was anesthetized.  The oblique incision was made over the temporal artery on  the left and the artery dissected out, ligated at both ends and again  approximately 3 cm segment was excised.  Hemostasis was obtained in the  wound.  The wound was closed with a deep layer of 3-0 Vicryl.  The skin  closed with 4-0 Vicryl.  Sterile dressing was applied.  This was done with  Dermabond.  The patient tolerated the procedure well and was transferred to  the recovery room in satisfactory condition.  All needle and sponge  counts  were correct.      Di Kindle. Edilia Bo, M.D.  Electronically Signed     CSD/MEDQ  D:  10/25/2005  T:  10/25/2005  Job:  [id number]

## 2011-02-04 ENCOUNTER — Other Ambulatory Visit: Payer: Self-pay | Admitting: Internal Medicine

## 2011-02-04 DIAGNOSIS — R229 Localized swelling, mass and lump, unspecified: Secondary | ICD-10-CM

## 2011-02-06 ENCOUNTER — Ambulatory Visit
Admission: RE | Admit: 2011-02-06 | Discharge: 2011-02-06 | Disposition: A | Discharge status: Home / Self Care | Payer: Medicare PPO | Source: Ambulatory Visit | Attending: Internal Medicine | Admitting: Internal Medicine

## 2011-02-06 DIAGNOSIS — R229 Localized swelling, mass and lump, unspecified: Secondary | ICD-10-CM

## 2011-02-06 MED ORDER — GADOBENATE DIMEGLUMINE 529 MG/ML IV SOLN
15.0000 mL | Freq: Once | INTRAVENOUS | Status: AC | PRN
  Administered 2011-02-06: 15 mL via INTRAVENOUS

## 2011-02-19 ENCOUNTER — Other Ambulatory Visit: Payer: Self-pay | Admitting: Orthopedic Surgery

## 2011-02-19 DIAGNOSIS — D1720 Benign lipomatous neoplasm of skin and subcutaneous tissue of unspecified limb: Secondary | ICD-10-CM

## 2011-05-03 ENCOUNTER — Other Ambulatory Visit: Payer: Medicare PPO

## 2011-05-03 HISTORY — PX: LIPOMA EXCISION: SHX5283

## 2011-05-10 ENCOUNTER — Inpatient Hospital Stay
Admission: RE | Admit: 2011-05-10 | Payer: Medicare PPO | Source: Ambulatory Visit | Attending: Orthopedic Surgery | Admitting: Orthopedic Surgery

## 2011-05-10 ENCOUNTER — Ambulatory Visit
Admission: RE | Admit: 2011-05-10 | Discharge: 2011-05-10 | Disposition: A | Discharge status: Home / Self Care | Payer: Medicare Other | Source: Ambulatory Visit | Attending: Internal Medicine | Admitting: Internal Medicine

## 2011-05-10 ENCOUNTER — Other Ambulatory Visit: Payer: Self-pay | Admitting: Internal Medicine

## 2011-05-10 DIAGNOSIS — D1720 Benign lipomatous neoplasm of skin and subcutaneous tissue of unspecified limb: Secondary | ICD-10-CM

## 2011-05-10 MED ORDER — GADOBENATE DIMEGLUMINE 529 MG/ML IV SOLN
15.0000 mL | Freq: Once | INTRAVENOUS | Status: AC | PRN
  Administered 2011-05-10: 15 mL via INTRAVENOUS

## 2011-11-26 ENCOUNTER — Ambulatory Visit (INDEPENDENT_AMBULATORY_CARE_PROVIDER_SITE_OTHER): Payer: Medicare Other | Admitting: Surgery

## 2011-11-26 ENCOUNTER — Encounter (INDEPENDENT_AMBULATORY_CARE_PROVIDER_SITE_OTHER): Payer: Self-pay | Admitting: Surgery

## 2011-11-26 VITALS — BP 124/74 | HR 70 | Temp 96.9°F | Resp 18 | Ht 61.0 in | Wt 175.6 lb

## 2011-11-26 DIAGNOSIS — K645 Perianal venous thrombosis: Secondary | ICD-10-CM

## 2011-11-26 MED ORDER — HYDROCODONE-ACETAMINOPHEN 5-325 MG PO TABS: 1.0000 | ORAL_TABLET | Freq: Four times a day (QID) | ORAL | Status: DC | PRN

## 2011-11-26 NOTE — Patient Instructions (Signed)
Hemorrhoidectomy Care After Hemorrhoidectomy is the removal of enlarged (dilated) veins around the rectum. Until the surgical areas are healed, control of pain and avoiding constipation are the greatest challenges for patients.  For as long as 24 hours after receiving an anesthetic (the medication that made you sleep), and while taking narcotic pain relievers, you may feel dizzy, weak and drowsy. For that reason, the following information applies to the first 24-hour period following surgery, and continues for as long as you are taking narcotic pain medications.  Do not drive a car, ride a bicycle, participate in activities in which you could be hurt. Do not take public transportation until you are off narcotic pain medications and until your caregiver says it is okay.   Do not drink alcohol, take tranquilizers, or medications not prescribed or allowed by your surgical caregiver.   Do not sign important papers or contracts for at least 24 hours or while taking narcotic medications.   Have a responsible person with you for 24 hours.  RISKS AND COMPLICATIONS Some problems that may occur following this procedure include:  Infection. A germ starts growing in the tissue surrounding the site operated on. This can usually be treated with antibiotics.   Damage to the rectal sphincter could occur. This is the muscle that opens in your anus to allow a bowel movement. This could cause incontinence. This is uncommon.   Bleeding following surgery can be a complication of almost any surgery. Your surgeon takes every precaution to keep this from happening.   Complications of anesthesia.  HOME CARE INSTRUCTIONS  Avoid straining when having bowel movements.   Avoid heavy lifting (more than 10 pounds (4.5 kilograms)).   Only take over-the-counter or prescription medicines for pain, discomfort, or fever as directed by your caregiver.   Take hot sitz baths for 20 to 30 minutes, 3 to 4 times per day.   To  keep swelling down, apply an ice pack for twenty minutes three to four times per day between sitz baths. Use a towel between your skin and the ice pack. Do not do this if it causes too much discomfort.   Keep anal area clean and dry. Following a bowel movement, you can gently wash the area with tucks (available for purchase at a drugstore) or cotton swabs. Gently pat the area dry. Do not rub the area.   Eat a well balanced diet and drink 6 to 8 glasses of water every day to avoid constipation. A bulk laxative may be also be helpful.  SEEK MEDICAL CARE IF:   You have increasing pain or tenderness near or in the surgical site.   You are unable to eat or drink.   You develop nausea or vomiting.   You develop uncontrolled bleeding such as soaking two to three pads in one hour.   You have constipation, not helped by changing your diet or increasing your fluid intake. Pain medications are a common cause of constipation.   You have pain and redness (inflammation) extending outside the area of your surgery.   You develop an unexplained oral temperature above 102 F (38.9 C), or any other signs of infection.   You have any other questions or concerns following surgery.  Document Released: 05/11/2003 Document Revised: 02/07/2011 Document Reviewed: 08/08/2008 [facility] Patient Information 2012 [place], [place].

## 2011-11-26 NOTE — Progress Notes (Signed)
Patient ID: LATRESA GASSER, female   DOB: [birth year], 71 y.o.   MRN: [id number]  Chief Complaint  Patient presents with  . Rectal Pain    hem    HPI RIATA IKEDA is a 71 y.o. female.   HPIpatient presents at the request of Dr. Bosie Clos do to perianal swelling, pain and discomfort since Thursday. Pain is severe made worse with sitting. No bleeding or drainage noted.  Past Medical History  Diagnosis Date  . Diabetes mellitus   . Hypertension   . Asthma     No past surgical history on file.  Family History  Problem Relation Age of Onset  . Cancer Mother     lung  . Heart disease Father   . Diabetes Sister     Social History History  Substance Use Topics  . Smoking status: Current Some Day Smoker -- 0.5 packs/day for 45 years    Types: Cigarettes  . Smokeless tobacco: Not on file  . Alcohol Use: Not on file    No Known Allergies  Current Outpatient Prescriptions  Medication Sig Dispense Refill  . albuterol (PROVENTIL HFA;VENTOLIN HFA) 108 (90 BASE) MCG/ACT inhaler Inhale 2 puffs into the lungs every 6 (six) hours as needed.      Marland Kitchen amLODipine (NORVASC) 2.5 MG tablet Take 2.5 mg by mouth daily.      Marland Kitchen aspirin 81 MG chewable tablet Chew 81 mg by mouth daily.      Marland Kitchen atorvastatin (LIPITOR) 10 MG tablet Take 10 mg by mouth daily.      . Calcium Carbonate-Vitamin D (CALCIUM-VITAMIN D) 500-200 MG-UNIT per tablet Take 1 tablet by mouth 2 (two) times daily with a meal.      . glucose blood test strip 1 each by Other route as needed. Use as instructed      . HYDROcodone-acetaminophen (NORCO) 5-325 MG per tablet Take 1 tablet by mouth every 6 (six) hours as needed for pain.  30 tablet  0  . latanoprost (XALATAN) 0.005 % ophthalmic solution 1 drop at bedtime.      Marland Kitchen lisinopril-hydrochlorothiazide (PRINZIDE,ZESTORETIC) 20-12.5 MG per tablet Take 1 tablet by mouth daily.      . pioglitazone-metformin (ACTOPLUS MET) 15-850 MG per tablet Take 1 tablet by mouth 2 (two) times daily  with a meal.        Review of Systems Review of Systems  Constitutional: Negative for fever, chills and unexpected weight change.  HENT: Negative for hearing loss, congestion, sore throat, trouble swallowing and voice change.   Eyes: Negative for visual disturbance.  Respiratory: Negative for cough and wheezing.   Cardiovascular: Negative for chest pain, palpitations and leg swelling.  Gastrointestinal: Positive for rectal pain. Negative for nausea, vomiting, abdominal pain, diarrhea, constipation, blood in stool, abdominal distention and anal bleeding.  Genitourinary: Negative for hematuria, vaginal bleeding and difficulty urinating.  Musculoskeletal: Negative for arthralgias.  Skin: Negative for rash and wound.  Neurological: Negative for seizures, syncope and headaches.  Hematological: Negative for adenopathy. Does not bruise/bleed easily.  Psychiatric/Behavioral: Negative for confusion.    Blood pressure 124/74, pulse 70, temperature 96.9 F (36.1 C), temperature source Temporal, resp. rate 18, height 5\' 1"  (1.549 m), weight 175 lb 9.6 oz (79.652 kg), SpO2 97.00%.  Physical Exam Physical Exam  Constitutional: She appears well-developed and well-nourished.  HENT:  Head: Normocephalic and atraumatic.  Eyes: EOM are normal. Pupils are equal, round, and reactive to light.  Neck: Normal range of motion.  Genitourinary:  Skin: Skin is warm and dry.   Under sterile conditions after discussing the procedure with the patient and getting her consent to do so, anal canal was prepped with Betadine. Large right lateral thrombosed external hemorrhoid injected with 1% lidocaine with epinephrine. Complex was excised. Good hemostasis of pressure. Dry dressing applied. Procedure tolerated well    Assessment    Thrombosed external hemorrhoids    Plan    Excised today in the office. Post excision care instructions given. Vicodin given for pain. Return 10 days. Call with questions or  concerns.       Caria Transue A. 11/26/2011, 4:07 PM

## 2011-12-09 ENCOUNTER — Ambulatory Visit (INDEPENDENT_AMBULATORY_CARE_PROVIDER_SITE_OTHER): Payer: Medicare Other | Admitting: Surgery

## 2011-12-09 ENCOUNTER — Encounter (INDEPENDENT_AMBULATORY_CARE_PROVIDER_SITE_OTHER): Payer: Self-pay | Admitting: Surgery

## 2011-12-09 VITALS — BP 150/78 | HR 72 | Temp 97.1°F | Resp 16 | Ht 61.5 in | Wt 174.8 lb

## 2011-12-09 DIAGNOSIS — Z87898 Personal history of other specified conditions: Secondary | ICD-10-CM

## 2011-12-09 DIAGNOSIS — Z8719 Personal history of other diseases of the digestive system: Secondary | ICD-10-CM

## 2011-12-09 NOTE — Patient Instructions (Signed)
High-Fiber Diet Fiber is found in fruits, vegetables, and grains. A high-fiber diet encourages the addition of more whole grains, legumes, fruits, and vegetables in your diet. The recommended amount of fiber for adult males is 38 g per day. For adult females, it is 25 g per day. Pregnant and lactating women should get 28 g of fiber per day. If you have a digestive or bowel problem, ask your caregiver for advice before adding high-fiber foods to your diet. Eat a variety of high-fiber foods instead of only a select few type of foods.  PURPOSE  To increase stool bulk.  To make bowel movements more regular to prevent constipation.  To lower cholesterol.  To prevent overeating. WHEN IS THIS DIET USED?  It may be used if you have constipation and hemorrhoids.  It may be used if you have uncomplicated diverticulosis (intestine condition) and irritable bowel syndrome.  It may be used if you need help with weight management.  It may be used if you want to add it to your diet as a protective measure against atherosclerosis, diabetes, and cancer. SOURCES OF FIBER  Whole-grain breads and cereals.  Fruits, such as apples, oranges, bananas, berries, prunes, and pears.  Vegetables, such as green peas, carrots, sweet potatoes, beets, broccoli, cabbage, spinach, and artichokes.  Legumes, such split peas, soy, lentils.  Almonds. FIBER CONTENT IN FOODS Starches and Grains / Dietary Fiber (g)  Cheerios, 1 cup / 3 g  Corn Flakes cereal, 1 cup / 0.7 g  Rice crispy treat cereal, 1 cup / 0.3 g  Instant oatmeal (cooked),  cup / 2 g  Frosted wheat cereal, 1 cup / 5.1 g  Brown, long-grain rice (cooked), 1 cup / 3.5 g  White, long-grain rice (cooked), 1 cup / 0.6 g  Enriched macaroni (cooked), 1 cup / 2.5 g Legumes / Dietary Fiber (g)  Baked beans (canned, plain, or vegetarian),  cup / 5.2 g  Kidney beans (canned),  cup / 6.8 g  Pinto beans (cooked),  cup / 5.5 g Breads and Crackers  / Dietary Fiber (g)  Plain or honey graham crackers, 2 squares / 0.7 g  Saltine crackers, 3 squares / 0.3 g  Plain, salted pretzels, 10 pieces / 1.8 g  Whole-wheat bread, 1 slice / 1.9 g  White bread, 1 slice / 0.7 g  Raisin bread, 1 slice / 1.2 g  Plain bagel, 3 oz / 2 g  Flour tortilla, 1 oz / 0.9 g  Corn tortilla, 1 small / 1.5 g  Hamburger or hotdog bun, 1 small / 0.9 g Fruits / Dietary Fiber (g)  Apple with skin, 1 medium / 4.4 g  Sweetened applesauce,  cup / 1.5 g  Banana,  medium / 1.5 g  Grapes, 10 grapes / 0.4 g  Orange, 1 small / 2.3 g  Raisin, 1.5 oz / 1.6 g  Melon, 1 cup / 1.4 g Vegetables / Dietary Fiber (g)  Green beans (canned),  cup / 1.3 g  Carrots (cooked),  cup / 2.3 g  Broccoli (cooked),  cup / 2.8 g  Peas (cooked),  cup / 4.4 g  Mashed potatoes,  cup / 1.6 g  Lettuce, 1 cup / 0.5 g  Corn (canned),  cup / 1.6 g  Tomato,  cup / 1.1 g Document Released: 02/18/2005 Document Revised: 08/20/2011 Document Reviewed: 05/23/2011 [employer] Patient Information 2013 [employer].  

## 2011-12-09 NOTE — Progress Notes (Signed)
Patient returns after excision of thrombosed external hemorrhoids 11 days ago. She feels much better.  Review of systems: Negative  Exam: Anal canal healing well. No signs of infection or recurrence.  Impression: Status post excision of thrombosed external hemorrhoid complex doing well  Plan: High-fiber diet.  Return as needed.

## 2011-12-10 ENCOUNTER — Telehealth (INDEPENDENT_AMBULATORY_CARE_PROVIDER_SITE_OTHER): Payer: Self-pay

## 2011-12-10 NOTE — Telephone Encounter (Signed)
Patient called to add PCP (Dr. Renford Dills) to her Care Team.

## 2012-09-23 ENCOUNTER — Other Ambulatory Visit: Payer: Medicare Other

## 2012-09-30 ENCOUNTER — Ambulatory Visit: Payer: Medicare Other | Admitting: Endocrinology

## 2012-10-19 ENCOUNTER — Other Ambulatory Visit: Payer: Self-pay | Admitting: *Deleted

## 2012-10-19 MED ORDER — LISINOPRIL-HYDROCHLOROTHIAZIDE 20-12.5 MG PO TABS: 1.0000 | ORAL_TABLET | Freq: Every day | ORAL | Status: DC

## 2012-10-19 MED ORDER — PIOGLITAZONE HCL-METFORMIN HCL 15-850 MG PO TABS: 1.0000 | ORAL_TABLET | Freq: Two times a day (BID) | ORAL | Status: DC

## 2013-04-28 ENCOUNTER — Other Ambulatory Visit (HOSPITAL_COMMUNITY): Payer: Self-pay | Admitting: Internal Medicine

## 2013-04-28 DIAGNOSIS — J441 Chronic obstructive pulmonary disease with (acute) exacerbation: Secondary | ICD-10-CM

## 2013-04-28 LAB — PULMONARY FUNCTION TEST
DL/VA % PRED: 92 %
DL/VA: 3.86 ml/min/mmHg/L
DLCO UNC: 8.74 ml/min/mmHg
DLCO unc % pred: 48 %
FEF 25-75 POST: 0.81 L/s
FEF 25-75 PRE: 0.63 L/s
FEF2575-%CHANGE-POST: 27 %
FEF2575-%PRED-PRE: 49 %
FEF2575-%Pred-Post: 63 %
FEV1-%Change-Post: 7 %
FEV1-%PRED-PRE: 79 %
FEV1-%Pred-Post: 85 %
FEV1-POST: 1.16 L
FEV1-PRE: 1.08 L
FEV1FVC-%CHANGE-POST: 4 %
FEV1FVC-%Pred-Pre: 86 %
FEV6-%CHANGE-POST: 3 %
FEV6-%PRED-POST: 98 %
FEV6-%Pred-Pre: 94 %
FEV6-Post: 1.66 L
FEV6-Pre: 1.6 L
FEV6FVC-%Change-Post: 1 %
FEV6FVC-%Pred-Post: 105 %
FEV6FVC-%Pred-Pre: 103 %
FVC-%CHANGE-POST: 2 %
FVC-%PRED-POST: 93 %
FVC-%Pred-Pre: 91 %
FVC-POST: 1.66 L
FVC-Pre: 1.62 L
POST FEV1/FVC RATIO: 70 %
POST FEV6/FVC RATIO: 100 %
PRE FEV1/FVC RATIO: 67 %
Pre FEV6/FVC Ratio: 99 %
RV % pred: 110 %
RV: 2.24 L
TLC % PRED: 92 %
TLC: 4.04 L

## 2013-05-10 ENCOUNTER — Ambulatory Visit (HOSPITAL_COMMUNITY)
Admission: RE | Admit: 2013-05-10 | Discharge: 2013-05-10 | Disposition: A | Discharge status: Home / Self Care | Payer: Medicare Other | Source: Ambulatory Visit | Attending: Internal Medicine | Admitting: Internal Medicine

## 2013-05-10 DIAGNOSIS — J441 Chronic obstructive pulmonary disease with (acute) exacerbation: Secondary | ICD-10-CM | POA: Insufficient documentation

## 2013-05-10 MED ORDER — ALBUTEROL SULFATE (2.5 MG/3ML) 0.083% IN NEBU
2.5000 mg | INHALATION_SOLUTION | Freq: Once | RESPIRATORY_TRACT | Status: AC
  Administered 2013-05-10: 2.5 mg via RESPIRATORY_TRACT

## 2013-09-22 ENCOUNTER — Encounter: Payer: Self-pay | Admitting: *Deleted

## 2013-11-02 ENCOUNTER — Other Ambulatory Visit: Payer: Self-pay | Admitting: Gastroenterology

## 2013-11-02 DIAGNOSIS — R1084 Generalized abdominal pain: Secondary | ICD-10-CM

## 2013-11-04 ENCOUNTER — Ambulatory Visit
Admission: RE | Admit: 2013-11-04 | Discharge: 2013-11-04 | Disposition: A | Discharge status: Home / Self Care | Payer: Medicare Other | Source: Ambulatory Visit | Attending: Gastroenterology | Admitting: Gastroenterology

## 2013-11-04 ENCOUNTER — Other Ambulatory Visit: Payer: Self-pay | Admitting: Gastroenterology

## 2013-11-04 DIAGNOSIS — R911 Solitary pulmonary nodule: Secondary | ICD-10-CM

## 2013-11-04 DIAGNOSIS — R1084 Generalized abdominal pain: Secondary | ICD-10-CM

## 2013-11-04 MED ORDER — IOHEXOL 300 MG/ML  SOLN
100.0000 mL | Freq: Once | INTRAMUSCULAR | Status: AC | PRN
  Administered 2013-11-04: 100 mL via INTRAVENOUS

## 2013-11-12 ENCOUNTER — Ambulatory Visit
Admission: RE | Admit: 2013-11-12 | Discharge: 2013-11-12 | Disposition: A | Discharge status: Home / Self Care | Payer: Medicare Other | Source: Ambulatory Visit | Attending: Gastroenterology | Admitting: Gastroenterology

## 2013-11-12 DIAGNOSIS — R911 Solitary pulmonary nodule: Secondary | ICD-10-CM

## 2013-11-26 ENCOUNTER — Encounter: Payer: Self-pay | Admitting: Emergency Medicine

## 2013-11-26 ENCOUNTER — Ambulatory Visit (INDEPENDENT_AMBULATORY_CARE_PROVIDER_SITE_OTHER): Payer: Medicare Other | Admitting: Emergency Medicine

## 2013-11-26 VITALS — BP 122/88 | HR 80 | Temp 97.2°F | Ht 60.0 in | Wt 181.0 lb

## 2013-11-26 DIAGNOSIS — F17200 Nicotine dependence, unspecified, uncomplicated: Secondary | ICD-10-CM | POA: Insufficient documentation

## 2013-11-26 DIAGNOSIS — R911 Solitary pulmonary nodule: Secondary | ICD-10-CM

## 2013-11-26 DIAGNOSIS — R918 Other nonspecific abnormal finding of lung field: Secondary | ICD-10-CM

## 2013-11-26 HISTORY — DX: Other nonspecific abnormal finding of lung field: R91.8

## 2013-11-26 NOTE — Patient Instructions (Signed)
We will repeat your CT scan of the chest in early December.  Please work on cutting down your smoking. We will talk about setting a quit date at the next visit.  Follow with Dr Lamonte Sakai in December to review your scan.

## 2013-11-26 NOTE — Assessment & Plan Note (Signed)
Incidental finding of a RLL 9mm nodule in a smoker. The size puts it right on the border of PET scan sensitivity. NCCN guidelines recommend a repeat CT scan in 3 months. Will perform this, plan to check PET if it grows. Follow in December.

## 2013-11-26 NOTE — Assessment & Plan Note (Signed)
Discussed cessation today. 

## 2013-11-26 NOTE — Progress Notes (Signed)
Subjective:    Patient ID: Angel Tanner, female    DOB: [birth year], 73 y.o.   MRN: [id number]  HPI 73 yo woman, smoker ( 23 pk-yrs), hx DM, HTN, COPD on albuterol prn. She is referred by [name] to evaluate 66m RLL pulmonary nodule.  The nodule was discovered originally on CT scan abd/pelvis for abdominal pain. Her pain is now resolved. She denies any breathing or chest sx.    Review of Systems  Constitutional: Negative for fever and unexpected weight change.  HENT: Negative for congestion, dental problem, ear pain, nosebleeds, postnasal drip, rhinorrhea, sinus pressure, sneezing, sore throat and trouble swallowing.   Eyes: Negative for redness and itching.  Respiratory: Positive for cough and shortness of breath. Negative for chest tightness and wheezing.   Cardiovascular: Negative for palpitations and leg swelling.  Gastrointestinal: Negative for nausea and vomiting.  Genitourinary: Negative for dysuria.  Musculoskeletal: Negative for joint swelling.  Skin: Negative for rash.  Neurological: Positive for headaches.  Hematological: Does not bruise/bleed easily.  Psychiatric/Behavioral: Negative for dysphoric mood. The patient is not nervous/anxious.    Past Medical History  Diagnosis Date  . Diabetes mellitus   . Hypertension   . Asthma   . Hypercholesteremia   . COPD (chronic obstructive pulmonary disease)   . Emphysema of lung      Family History  Problem Relation Age of Onset  . Cancer Mother     lung  . Heart disease Father   . Diabetes Sister      History   Social History  . Marital Status: Widowed    Spouse Name: N/A    Number of Children: N/A  . Years of Education: N/A   Occupational History  . Not on file.   Social History Main Topics  . Smoking status: Current Some Day Smoker -- 0.50 packs/day for 45 years    Types: Cigarettes  . Smokeless tobacco: Not on file  . Alcohol Use: No  . Drug Use: No  . Sexual Activity: Not on file   Other  Topics Concern  . Not on file   Social History Narrative   Tobacco Use: Smoker for 40+ years 1PPD,    No Alcohol   Caffeine: Yes 2-3 Servings per day   No recreational drug use     No Known Allergies   Outpatient Prescriptions Prior to Visit  Medication Sig Dispense Refill  . albuterol (PROVENTIL HFA;VENTOLIN HFA) 108 (90 BASE) MCG/ACT inhaler Inhale 2 puffs into the lungs every 6 (six) hours as needed.      .Marland KitchenamLODipine (NORVASC) 2.5 MG tablet Take 2.5 mg by mouth daily.      .Marland Kitchenaspirin 81 MG chewable tablet Chew 81 mg by mouth daily.      .Marland Kitchenatorvastatin (LIPITOR) 10 MG tablet Take 10 mg by mouth daily.      . Calcium Carbonate-Vitamin D (CALCIUM-VITAMIN D) 500-200 MG-UNIT per tablet Take 1 tablet by mouth 2 (two) times daily with a meal.      . glucose blood test strip 1 each by Other route as needed. Use as instructed      . latanoprost (XALATAN) 0.005 % ophthalmic solution 1 drop at bedtime.      .Marland Kitchenlisinopril-hydrochlorothiazide (PRINZIDE,ZESTORETIC) 20-12.5 MG per tablet Take 1 tablet by mouth daily.  30 tablet  0  . pioglitazone-metformin (ACTOPLUS MET) 15-850 MG per tablet Take 1 tablet by mouth 2 (two) times daily with a meal.  60 tablet  0  No facility-administered medications prior to visit.         Objective:   Physical Exam Filed Vitals:   11/26/13 1141 11/26/13 1142  BP:  122/88  Pulse:  80  Temp:  97.2 F (36.2 C)  TempSrc:  Oral  Height: 5' (1.524 m) 5' (1.524 m)  Weight: 181 lb (82.101 kg) 181 lb (82.101 kg)  SpO2:  98%    Gen: Pleasant, obese, in no distress,  normal affect  ENT: No lesions,  mouth clear,  oropharynx clear, no postnasal drip  Neck: No JVD, no TMG, no carotid bruits  Lungs: No use of accessory muscles, clear without rales or rhonchi  Cardiovascular: RRR, heart sounds normal, no murmur or gallops, no peripheral edema  Musculoskeletal: No deformities, no cyanosis or clubbing  Neuro: alert, non focal  Skin: Warm, no lesions or  rashes  11/12/13 --  COMPARISON: Abdomen and pelvis CT from 11/04/2013.  FINDINGS:  Soft tissue / Mediastinum: No axillary, mediastinal, or hilar  lymphadenopathy. The heart size is borderline increased. Coronary  artery calcification is noted. No pericardial effusion.  Lungs / Pleura: Subsegmental atelectasis is seen in the right middle  lobe and lingula. 8 mm right lower lobe pulmonary nodule again  noted. No other pulmonary nodule or mass is evident. No focal  airspace consolidation. No pulmonary edema or pleural effusion.  Bones: Bone windows reveal no worrisome lytic or sclerotic osseous  lesions.  Upper Abdomen: Visualized portions of the liver and spleen are  unremarkable.  IMPRESSION:  8 mm right lower lobe pulmonary nodule. No other pulmonary nodules  are evident. If the patient is at high risk for bronchogenic  carcinoma, follow-up chest CT at 3-51month is recommended. If the  patient is at low risk for bronchogenic carcinoma, follow-up chest  CT at 6-12 months is recommended.      Assessment & Plan:  Solitary pulmonary nodule Incidental finding of a RLL 89mnodule in a smoker. The size puts it right on the border of PET scan sensitivity. NCCN guidelines recommend a repeat CT scan in 3 months. Will perform this, plan to check PET if it grows. Follow in December.   Tobacco use disorder Discussed cessation today.

## 2014-02-02 ENCOUNTER — Ambulatory Visit (INDEPENDENT_AMBULATORY_CARE_PROVIDER_SITE_OTHER)
Admission: RE | Admit: 2014-02-02 | Discharge: 2014-02-02 | Disposition: A | Discharge status: Home / Self Care | Payer: Medicare Other | Source: Ambulatory Visit | Attending: Emergency Medicine | Admitting: Emergency Medicine

## 2014-02-02 DIAGNOSIS — R911 Solitary pulmonary nodule: Secondary | ICD-10-CM

## 2014-02-11 ENCOUNTER — Ambulatory Visit: Payer: Medicare Other | Admitting: Internal Medicine

## 2014-02-15 ENCOUNTER — Encounter: Payer: Self-pay | Admitting: Adult Health

## 2014-02-15 ENCOUNTER — Ambulatory Visit (INDEPENDENT_AMBULATORY_CARE_PROVIDER_SITE_OTHER): Payer: Medicare Other | Admitting: Adult Health

## 2014-02-15 VITALS — BP 136/82 | HR 77 | Temp 98.5°F | Ht 61.5 in | Wt 179.0 lb

## 2014-02-15 DIAGNOSIS — R911 Solitary pulmonary nodule: Secondary | ICD-10-CM

## 2014-02-15 NOTE — Patient Instructions (Signed)
Continue to work on waiting smoking. We will set you up for a CT to follow your lung nodule in 6 months Follow with Dr. Lamonte Sakai in 6 months to discuss her CT results

## 2014-02-15 NOTE — Assessment & Plan Note (Signed)
Stable nodule on CT in active smoker  Need to repeat in 6 months  Smoking cessation discussed   Plan  Continue to work on waiting smoking. We will set you up for a CT to follow your lung nodule in 6 months Follow with Dr. Lamonte Sakai in 6 months to discuss her CT results

## 2014-02-15 NOTE — Progress Notes (Signed)
Subjective:    Patient ID: Angel Tanner, female    DOB: 1940/10/17, 73 y.o.   MRN: [id number]  HPI 73 yo woman, smoker ( 23 pk-yrs),  57mm RLL pulmonary nodule.  The nodule was discovered originally on CT scan abd/pelvis for abdominal pain.   02/15/2014 Follow up Lung Nodule  Patient returns for a follow-up visit We have been following a small pulmonary nodule in the right lower lobe. Last CT showed an 8 mm right lower lobe pulmonary nodule Repeat CT on December 2 showed a stable 7 mm right lower lobe pulmonary nodule. We reviewed these test results. Patient continued to smoke, smoking cessation was discussed He denies any chest pain, hemoptysis, orthopnea, PND, weight loss, or leg swelling Previous PFTs in March of this year showed preserved lung function.   Review of Systems  Constitutional:   No  weight loss, night sweats,  Fevers, chills, fatigue, or  lassitude.  HEENT:   No headaches,  Difficulty swallowing,  Tooth/dental problems, or  Sore throat,                No sneezing, itching, ear ache, nasal congestion, post nasal drip,   CV:  No chest pain,  Orthopnea, PND, swelling in lower extremities, anasarca, dizziness, palpitations, syncope.   GI  No heartburn, indigestion, abdominal pain, nausea, vomiting, diarrhea, change in bowel habits, loss of appetite, bloody stools.   Resp: No shortness of breath with exertion or at rest.  No excess mucus, no productive cough,  No non-productive cough,  No coughing up of blood.  No change in color of mucus.  No wheezing.  No chest wall deformity  Skin: no rash or lesions.  GU: no dysuria, change in color of urine, no urgency or frequency.  No flank pain, no hematuria   MS:  No joint pain or swelling.  No decreased range of motion.  No back pain.  Psych:  No change in mood or affect. No depression or anxiety.  No memory loss.         Objective:   Physical Exam  Gen: Pleasant, obese, in no distress,  normal affect  ENT:  No lesions,  mouth clear,  oropharynx clear, no postnasal drip  Neck: No JVD, no TMG, no carotid bruits  Lungs: No use of accessory muscles, clear without rales or rhonchi  Cardiovascular: RRR, heart sounds normal, no murmur or gallops, no peripheral edema  Musculoskeletal: No deformities, no cyanosis or clubbing  Neuro: alert, non focal  Skin: Warm, no lesions or rashes  11/12/13 --  COMPARISON: Abdomen and pelvis CT from 11/04/2013.  FINDINGS:  Soft tissue / Mediastinum: No axillary, mediastinal, or hilar  lymphadenopathy. The heart size is borderline increased. Coronary  artery calcification is noted. No pericardial effusion.  Lungs / Pleura: Subsegmental atelectasis is seen in the right middle  lobe and lingula. 8 mm right lower lobe pulmonary nodule again  noted. No other pulmonary nodule or mass is evident. No focal  airspace consolidation. No pulmonary edema or pleural effusion.  Bones: Bone windows reveal no worrisome lytic or sclerotic osseous  lesions.  Upper Abdomen: Visualized portions of the liver and spleen are  unremarkable.  IMPRESSION:  8 mm right lower lobe pulmonary nodule. No other pulmonary nodules  are evident. If the patient is at high risk for bronchogenic  carcinoma, follow-up chest CT at 3-9months is recommended. If the  patient is at low risk for bronchogenic carcinoma, follow-up chest  CT at 6-12  months is recommended.   02/02/14 Stable 7 mm right lower lobe pulmonary nodule. Continued followup by chest CT recommended in 6-9 months     Assessment & Plan:

## 2014-08-23 ENCOUNTER — Encounter: Payer: Self-pay | Admitting: Gastroenterology

## 2014-08-24 ENCOUNTER — Ambulatory Visit (HOSPITAL_COMMUNITY)
Admission: RE | Admit: 2014-08-24 | Discharge: 2014-08-24 | Disposition: A | Discharge status: Home / Self Care | Payer: PPO | Source: Ambulatory Visit | Attending: Vascular Surgery | Admitting: Vascular Surgery

## 2014-08-24 ENCOUNTER — Other Ambulatory Visit (HOSPITAL_COMMUNITY): Payer: Self-pay | Admitting: Internal Medicine

## 2014-08-24 DIAGNOSIS — I739 Peripheral vascular disease, unspecified: Secondary | ICD-10-CM | POA: Diagnosis present

## 2014-08-26 ENCOUNTER — Encounter: Payer: Self-pay | Admitting: Emergency Medicine

## 2014-08-26 ENCOUNTER — Ambulatory Visit (INDEPENDENT_AMBULATORY_CARE_PROVIDER_SITE_OTHER): Payer: PPO | Admitting: Emergency Medicine

## 2014-08-26 ENCOUNTER — Ambulatory Visit (INDEPENDENT_AMBULATORY_CARE_PROVIDER_SITE_OTHER)
Admission: RE | Admit: 2014-08-26 | Discharge: 2014-08-26 | Disposition: A | Discharge status: Home / Self Care | Payer: PPO | Source: Ambulatory Visit | Attending: Emergency Medicine | Admitting: Emergency Medicine

## 2014-08-26 VITALS — BP 136/70 | HR 89 | Wt 177.0 lb

## 2014-08-26 DIAGNOSIS — I719 Aortic aneurysm of unspecified site, without rupture: Secondary | ICD-10-CM | POA: Diagnosis not present

## 2014-08-26 DIAGNOSIS — Z720 Tobacco use: Secondary | ICD-10-CM

## 2014-08-26 DIAGNOSIS — R918 Other nonspecific abnormal finding of lung field: Secondary | ICD-10-CM | POA: Diagnosis not present

## 2014-08-26 DIAGNOSIS — F17200 Nicotine dependence, unspecified, uncomplicated: Secondary | ICD-10-CM

## 2014-08-26 DIAGNOSIS — R911 Solitary pulmonary nodule: Secondary | ICD-10-CM | POA: Diagnosis not present

## 2014-08-26 NOTE — Patient Instructions (Signed)
Please continue to work hard on decreasing your cigarettes We will refer you to cardiothoracic surgery to follow your enlarged aorta Your right lower lobe pulmonary nodule is stable in size and is almost certainly benign. There is a smaller right upper lobe nodule that will need to be followed. This can likely be done on CAT scan as we follow your aortic aneurysm.  Follow with Dr Lamonte Sakai in 6 months or sooner if you have any problems

## 2014-08-26 NOTE — Progress Notes (Signed)
   Subjective:    Patient ID: Angel Tanner, female    DOB: 1940/09/22, 74 y.o.   MRN: [id number]  HPI 74 yo woman, smoker ( 23 pk-yrs),  36mm RLL pulmonary nodule.  The nodule was discovered originally on CT scan abd/pelvis for abdominal pain.   Follow up Lung Nodule  Patient returns for a follow-up visit We have been following a small pulmonary nodule in the right lower lobe. Last CT showed an 8 mm right lower lobe pulmonary nodule Repeat CT on December 2 showed a stable 7 mm right lower lobe pulmonary nodule. We reviewed these test results. Patient continued to smoke, smoking cessation was discussed He denies any chest pain, hemoptysis, orthopnea, PND, weight loss, or leg swelling Previous PFTs in March of this year showed preserved lung function.  ROV 08/26/14 -- follow-up visit for history of tobacco abuse and a 7-8 mm right lower lobe pulmonary nodule first seen 11/12/13, but in retrospect dating back to 05/30/08. She underwent a repeat CT scan of the chest earlier today and I have personally reviewed this. It shows that her right lower lobe nodule is unchanged in size going all the way back to 2010. She also has a 3 mm right upper lobe nodule that has not changed since 11/12/13.  She also has an enlarged ascending aorta on both scans - has never been formally evaluated for this. She continues to smoke half a pack a day     Objective:   Physical Exam Filed Vitals:   08/26/14 1423  BP: 136/70  Pulse: 89  Weight: 80.287 kg (177 lb)  SpO2: 91%   Gen: Pleasant, obese, in no distress,  normal affect  ENT: No lesions,  mouth clear,  oropharynx clear, no postnasal drip  Neck: No JVD, no TMG, no carotid bruits  Lungs: No use of accessory muscles, clear without rales or rhonchi  Cardiovascular: RRR, heart sounds normal, no murmur or gallops, no peripheral edema  Musculoskeletal: No deformities, no cyanosis or clubbing  Neuro: alert, non focal  Skin: Warm, no lesions or  rashes       Assessment & Plan:   Tobacco use disorder Encouraged cessation  Pulmonary nodules Your right lower lobe pulmonary nodule is stable in size and is almost certainly benign. There is a smaller right upper lobe nodule that will need to be followed. This can likely be done on CAT scan as we follow your aortic aneurysm.  Follow with Dr Lamonte Sakai in 6 months or sooner if you have any problems

## 2014-08-30 ENCOUNTER — Other Ambulatory Visit: Payer: Self-pay

## 2014-08-30 ENCOUNTER — Encounter: Payer: PPO | Admitting: Cardiothoracic Surgery

## 2014-09-02 ENCOUNTER — Institutional Professional Consult (permissible substitution) (INDEPENDENT_AMBULATORY_CARE_PROVIDER_SITE_OTHER): Payer: PPO | Admitting: Cardiothoracic Surgery

## 2014-09-02 ENCOUNTER — Encounter: Payer: Self-pay | Admitting: Cardiothoracic Surgery

## 2014-09-02 VITALS — BP 131/77 | HR 87 | Resp 20 | Ht 61.0 in | Wt 179.0 lb

## 2014-09-02 DIAGNOSIS — I712 Thoracic aortic aneurysm, without rupture, unspecified: Secondary | ICD-10-CM
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
## 2014-09-29 ENCOUNTER — Ambulatory Visit: Payer: Self-pay | Admitting: Emergency Medicine

## 2015-02-17 ENCOUNTER — Encounter: Payer: Self-pay | Admitting: Vascular Surgery

## 2015-02-23 ENCOUNTER — Encounter: Payer: Self-pay | Admitting: Vascular Surgery

## 2015-02-23 ENCOUNTER — Ambulatory Visit (INDEPENDENT_AMBULATORY_CARE_PROVIDER_SITE_OTHER): Payer: PPO | Admitting: Vascular Surgery

## 2015-02-23 VITALS — BP 131/77 | HR 74 | Temp 99.3°F | Resp 16 | Ht 61.5 in | Wt 181.0 lb

## 2015-02-23 DIAGNOSIS — M79604 Pain in right leg: Secondary | ICD-10-CM

## 2015-02-23 DIAGNOSIS — M79605 Pain in left leg: Secondary | ICD-10-CM
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
## 2015-02-24 NOTE — Addendum Note (Signed)
Addended by: Thresa Ross C on: 02/24/2015 10:15 AM   Modules accepted: Orders

## 2015-03-13 ENCOUNTER — Ambulatory Visit: Payer: PPO | Admitting: Emergency Medicine

## 2015-03-14 ENCOUNTER — Ambulatory Visit (INDEPENDENT_AMBULATORY_CARE_PROVIDER_SITE_OTHER): Payer: PPO | Admitting: Emergency Medicine

## 2015-03-14 ENCOUNTER — Encounter: Payer: Self-pay | Admitting: Emergency Medicine

## 2015-03-14 VITALS — BP 110/78 | HR 74 | Ht 61.0 in | Wt 179.0 lb

## 2015-03-14 DIAGNOSIS — G4733 Obstructive sleep apnea (adult) (pediatric): Secondary | ICD-10-CM | POA: Diagnosis not present

## 2015-03-14 DIAGNOSIS — Z1211 Encounter for screening for malignant neoplasm of colon: Secondary | ICD-10-CM | POA: Diagnosis not present

## 2015-03-14 DIAGNOSIS — I712 Thoracic aortic aneurysm, without rupture, unspecified: Secondary | ICD-10-CM

## 2015-03-14 DIAGNOSIS — K219 Gastro-esophageal reflux disease without esophagitis: Secondary | ICD-10-CM | POA: Diagnosis not present

## 2015-03-14 DIAGNOSIS — F17200 Nicotine dependence, unspecified, uncomplicated: Secondary | ICD-10-CM | POA: Diagnosis not present

## 2015-03-14 DIAGNOSIS — R911 Solitary pulmonary nodule: Secondary | ICD-10-CM | POA: Diagnosis not present

## 2015-03-14 NOTE — Assessment & Plan Note (Signed)
Not on CPAP, couldn't tolerate.

## 2015-03-14 NOTE — Assessment & Plan Note (Signed)
We will follow serial scans when she needs to have her TAA evaluated in June 2017. I will see her after to review

## 2015-03-14 NOTE — Assessment & Plan Note (Signed)
We will continue to work on stopping smoking.

## 2015-03-14 NOTE — Progress Notes (Signed)
   Subjective:    Patient ID: Angel Tanner, female    DOB: [birth year], 75 y.o.   MRN: [id number]  HPI 75  yo woman, smoker ( 23 pk-yrs),  79mm RLL pulmonary nodule.  The nodule was discovered originally on CT scan abd/pelvis for abdominal pain.   Follow up Lung Nodule  Patient returns for a follow-up visit We have been following a small pulmonary nodule in the right lower lobe. Last CT showed an 8 mm right lower lobe pulmonary nodule Repeat CT on December 2 showed a stable 7 mm right lower lobe pulmonary nodule. We reviewed these test results. Patient continued to smoke, smoking cessation was discussed He denies any chest pain, hemoptysis, orthopnea, PND, weight loss, or leg swelling Previous PFTs in March of this year showed preserved lung function.  ROV 08/26/14 -- follow-up visit for history of tobacco abuse and a 7-8 mm right lower lobe pulmonary nodule first seen 11/12/13, but in retrospect dating back to 05/30/08. She underwent a repeat CT scan of the chest earlier today and I have personally reviewed this. It shows that her right lower lobe nodule is unchanged in size going all the way back to 2010. She also has a 3 mm right upper lobe nodule that has not changed since 11/12/13.  She also has an enlarged ascending aorta on both scans - has never been formally evaluated for this. She continues to smoke half a pack a day  ROV 03/14/15 -- follow up visit for hx of tobacco, pulmonary nodules as descrbed above.  She was seen by Dr Darcey Nora regarding her aortic aneurysm. He is planning to follow the TAA with serial scans.  She continues to smoke, FEV1 1.08 (79%). She feels that her breathing has been doing well. She has albuterol available, uses it rarely, about 1-2x a week. She had a recent URI that she treated with OTC meds. She is smoking 1/2 pk a day.      Objective:   Physical Exam Filed Vitals:   03/14/15 1503  BP: 110/78  Pulse: 74  Height: 5\' 1"  (1.549 m)  Weight: 179 lb (81.194  kg)  SpO2: 94%   Gen: Pleasant, obese, in no distress,  normal affect  ENT: No lesions,  mouth clear,  oropharynx clear, no postnasal drip  Neck: No JVD, no TMG, no carotid bruits  Lungs: No use of accessory muscles, clear without rales or rhonchi  Cardiovascular: RRR, heart sounds normal, no murmur or gallops, no peripheral edema  Musculoskeletal: No deformities, no cyanosis or clubbing  Neuro: alert, non focal  Skin: Warm, no lesions or rashes       Assessment & Plan:   Solitary pulmonary nodule We will follow serial scans when she needs to have her TAA evaluated in June 2017. I will see her after to review  Tobacco use disorder We will continue to work on stopping smoking.   Obstructive sleep apnea Not on CPAP, couldn't tolerate.

## 2015-03-14 NOTE — Patient Instructions (Signed)
We will repeat your CT chest in June 2017 Continue your albuterol 2 puffs as needed for shortness of breath.  You need to work hard on decreasing your smoking. Ultimately our goal needs to be for you to stop completely Follow with Dr Lamonte Sakai in 6 months or sooner if you have any problems

## 2015-03-15 DIAGNOSIS — M8589 Other specified disorders of bone density and structure, multiple sites: Secondary | ICD-10-CM | POA: Diagnosis not present

## 2015-03-15 DIAGNOSIS — M859 Disorder of bone density and structure, unspecified: Secondary | ICD-10-CM | POA: Diagnosis not present

## 2015-03-17 DIAGNOSIS — K219 Gastro-esophageal reflux disease without esophagitis: Secondary | ICD-10-CM | POA: Diagnosis not present

## 2015-03-17 DIAGNOSIS — Z23 Encounter for immunization: Secondary | ICD-10-CM | POA: Diagnosis not present

## 2015-03-17 DIAGNOSIS — I739 Peripheral vascular disease, unspecified: Secondary | ICD-10-CM | POA: Diagnosis not present

## 2015-03-17 DIAGNOSIS — J449 Chronic obstructive pulmonary disease, unspecified: Secondary | ICD-10-CM | POA: Diagnosis not present

## 2015-03-17 DIAGNOSIS — M25512 Pain in left shoulder: Secondary | ICD-10-CM | POA: Diagnosis not present

## 2015-03-17 DIAGNOSIS — I10 Essential (primary) hypertension: Secondary | ICD-10-CM | POA: Diagnosis not present

## 2015-03-17 DIAGNOSIS — Z7984 Long term (current) use of oral hypoglycemic drugs: Secondary | ICD-10-CM | POA: Diagnosis not present

## 2015-03-17 DIAGNOSIS — E1151 Type 2 diabetes mellitus with diabetic peripheral angiopathy without gangrene: Secondary | ICD-10-CM | POA: Diagnosis not present

## 2015-05-25 ENCOUNTER — Ambulatory Visit (HOSPITAL_COMMUNITY)
Admission: RE | Admit: 2015-05-25 | Discharge: 2015-05-25 | Disposition: A | Discharge status: Home / Self Care | Payer: PPO | Source: Ambulatory Visit | Attending: Vascular Surgery | Admitting: Vascular Surgery

## 2015-05-25 ENCOUNTER — Ambulatory Visit: Payer: PPO | Admitting: Vascular Surgery

## 2015-05-25 ENCOUNTER — Telehealth (HOSPITAL_COMMUNITY): Payer: Self-pay | Admitting: *Deleted

## 2015-05-25 DIAGNOSIS — M79605 Pain in left leg: Principal | ICD-10-CM

## 2015-05-25 DIAGNOSIS — M79604 Pain in right leg: Secondary | ICD-10-CM

## 2015-05-25 NOTE — Telephone Encounter (Signed)
Patient came to her appointment for varicose vein duplex- stated her legs have not bothered her in months and decided the evaluation wasn't necessary as long as she was feeling better. I told her we could cancel her ultrasound appointment today and her follow-up with Dr. Oneida Alar and let her know she could call us at any time in the future should her legs bother her again.

## 2015-06-08 ENCOUNTER — Ambulatory Visit: Payer: PPO | Admitting: Vascular Surgery

## 2015-07-21 DIAGNOSIS — E1151 Type 2 diabetes mellitus with diabetic peripheral angiopathy without gangrene: Secondary | ICD-10-CM | POA: Diagnosis not present

## 2015-07-21 DIAGNOSIS — I10 Essential (primary) hypertension: Secondary | ICD-10-CM | POA: Diagnosis not present

## 2015-07-21 DIAGNOSIS — H6120 Impacted cerumen, unspecified ear: Secondary | ICD-10-CM | POA: Diagnosis not present

## 2015-07-21 DIAGNOSIS — K219 Gastro-esophageal reflux disease without esophagitis: Secondary | ICD-10-CM | POA: Diagnosis not present

## 2015-07-21 DIAGNOSIS — E7800 Pure hypercholesterolemia, unspecified: Secondary | ICD-10-CM | POA: Diagnosis not present

## 2015-07-21 DIAGNOSIS — J441 Chronic obstructive pulmonary disease with (acute) exacerbation: Secondary | ICD-10-CM | POA: Diagnosis not present

## 2015-07-21 DIAGNOSIS — Z7984 Long term (current) use of oral hypoglycemic drugs: Secondary | ICD-10-CM | POA: Diagnosis not present

## 2015-07-21 DIAGNOSIS — I739 Peripheral vascular disease, unspecified: Secondary | ICD-10-CM | POA: Diagnosis not present

## 2015-07-27 DIAGNOSIS — H6121 Impacted cerumen, right ear: Secondary | ICD-10-CM | POA: Diagnosis not present

## 2015-08-07 ENCOUNTER — Other Ambulatory Visit (INDEPENDENT_AMBULATORY_CARE_PROVIDER_SITE_OTHER): Payer: PPO

## 2015-08-07 DIAGNOSIS — R911 Solitary pulmonary nodule: Secondary | ICD-10-CM

## 2015-08-07 DIAGNOSIS — I712 Thoracic aortic aneurysm, without rupture, unspecified: Secondary | ICD-10-CM

## 2015-08-07 LAB — BASIC METABOLIC PANEL
BUN: 19 mg/dL (ref 6–23)
CHLORIDE: 100 meq/L (ref 96–112)
CO2: 33 mEq/L — ABNORMAL HIGH (ref 19–32)
CREATININE: 0.66 mg/dL (ref 0.40–1.20)
Calcium: 9.5 mg/dL (ref 8.4–10.5)
GFR: 112.18 mL/min (ref >60.00)
Glucose, Bld: 161 mg/dL — ABNORMAL HIGH (ref 70–99)
POTASSIUM: 3.8 meq/L (ref 3.5–5.1)
SODIUM: 139 meq/L (ref 135–145)

## 2015-08-09 NOTE — Assessment & Plan Note (Signed)
Encouraged cessation.

## 2015-08-09 NOTE — Assessment & Plan Note (Signed)
Your right lower lobe pulmonary nodule is stable in size and is almost certainly benign. There is a smaller right upper lobe nodule that will need to be followed. This can likely be done on CAT scan as we follow your aortic aneurysm.  Follow with Dr Lamonte Sakai in 6 months or sooner if you have any problems

## 2015-08-14 ENCOUNTER — Other Ambulatory Visit: Payer: Self-pay | Admitting: Emergency Medicine

## 2015-08-14 ENCOUNTER — Ambulatory Visit
Admission: RE | Admit: 2015-08-14 | Discharge: 2015-08-14 | Disposition: A | Discharge status: Home / Self Care | Payer: PPO | Source: Ambulatory Visit | Attending: Emergency Medicine | Admitting: Emergency Medicine

## 2015-08-14 DIAGNOSIS — I712 Thoracic aortic aneurysm, without rupture, unspecified: Secondary | ICD-10-CM

## 2015-08-14 DIAGNOSIS — R911 Solitary pulmonary nodule: Secondary | ICD-10-CM

## 2015-08-22 ENCOUNTER — Ambulatory Visit (INDEPENDENT_AMBULATORY_CARE_PROVIDER_SITE_OTHER)
Admission: RE | Admit: 2015-08-22 | Discharge: 2015-08-22 | Disposition: A | Discharge status: Home / Self Care | Payer: PPO | Source: Ambulatory Visit | Attending: Emergency Medicine | Admitting: Emergency Medicine

## 2015-08-22 DIAGNOSIS — R911 Solitary pulmonary nodule: Secondary | ICD-10-CM

## 2015-08-22 DIAGNOSIS — I712 Thoracic aortic aneurysm, without rupture: Secondary | ICD-10-CM | POA: Diagnosis not present

## 2015-08-22 MED ORDER — IOPAMIDOL (ISOVUE-370) INJECTION 76%
100.0000 mL | Freq: Once | INTRAVENOUS | Status: AC | PRN
  Administered 2015-08-22: 100 mL via INTRAVENOUS

## 2015-08-30 ENCOUNTER — Other Ambulatory Visit: Payer: Self-pay | Admitting: Cardiothoracic Surgery

## 2015-08-30 DIAGNOSIS — I712 Thoracic aortic aneurysm, without rupture, unspecified: Secondary | ICD-10-CM

## 2015-08-31 DIAGNOSIS — H04123 Dry eye syndrome of bilateral lacrimal glands: Secondary | ICD-10-CM | POA: Diagnosis not present

## 2015-08-31 DIAGNOSIS — H25813 Combined forms of age-related cataract, bilateral: Secondary | ICD-10-CM | POA: Diagnosis not present

## 2015-08-31 DIAGNOSIS — H5712 Ocular pain, left eye: Secondary | ICD-10-CM | POA: Diagnosis not present

## 2015-08-31 DIAGNOSIS — H40053 Ocular hypertension, bilateral: Secondary | ICD-10-CM | POA: Diagnosis not present

## 2015-09-18 ENCOUNTER — Ambulatory Visit (INDEPENDENT_AMBULATORY_CARE_PROVIDER_SITE_OTHER): Payer: PPO | Admitting: Emergency Medicine

## 2015-09-18 ENCOUNTER — Encounter: Payer: Self-pay | Admitting: Emergency Medicine

## 2015-09-18 VITALS — BP 126/82 | HR 73 | Ht 61.5 in | Wt 179.0 lb

## 2015-09-18 DIAGNOSIS — J449 Chronic obstructive pulmonary disease, unspecified: Secondary | ICD-10-CM | POA: Diagnosis not present

## 2015-09-18 DIAGNOSIS — F17200 Nicotine dependence, unspecified, uncomplicated: Secondary | ICD-10-CM

## 2015-09-18 DIAGNOSIS — R918 Other nonspecific abnormal finding of lung field: Secondary | ICD-10-CM | POA: Diagnosis not present

## 2015-09-18 HISTORY — DX: Chronic obstructive pulmonary disease, unspecified: J44.9

## 2015-09-18 NOTE — Progress Notes (Signed)
Subjective:    Patient ID: Angel Tanner, female    DOB: 1941-01-04, 75 y.o.   MRN: [id number]  HPI 75  yo woman, smoker ( 23 pk-yrs),  37mm RLL pulmonary nodule.  The nodule was discovered originally on CT scan abd/pelvis for abdominal pain.   Follow up Lung Nodule  Patient returns for a follow-up visit We have been following a small pulmonary nodule in the right lower lobe. Last CT showed an 8 mm right lower lobe pulmonary nodule Repeat CT on December 2 showed a stable 7 mm right lower lobe pulmonary nodule. We reviewed these test results. Patient continued to smoke, smoking cessation was discussed He denies any chest pain, hemoptysis, orthopnea, PND, weight loss, or leg swelling Previous PFTs in March of this year showed preserved lung function.  ROV 08/26/14 -- follow-up visit for history of tobacco abuse and a 7-8 mm right lower lobe pulmonary nodule first seen 11/12/13, but in retrospect dating back to 05/30/08. She underwent a repeat CT scan of the chest earlier today and I have personally reviewed this. It shows that her right lower lobe nodule is unchanged in size going all the way back to 2010. She also has a 3 mm right upper lobe nodule that has not changed since 11/12/13.  She also has an enlarged ascending aorta on both scans - has never been formally evaluated for this. She continues to smoke half a pack a day  ROV 03/14/15 -- follow up visit for hx of tobacco, pulmonary nodules as descrbed above.  She was seen by Dr Darcey Nora regarding her aortic aneurysm. He is planning to follow the TAA with serial scans.  She continues to smoke, FEV1 1.08 (79%). She feels that her breathing has been doing well. She has albuterol available, uses it rarely, about 1-2x a week. She had a recent URI that she treated with OTC meds. She is smoking 1/2 pk a day.   ROV 09/18/15 -- patient has a history of tobacco use and COPD. She also has pulmonary nodules: An 8 mm right lower lobe nodule that dates back  to 2010, a 3 mm right upper lobe nodule noted 11/12/13. She had a CT scan of the chest on 08/22/15 to follow these nodules and also her thoracic aorta ( noted be stable compared with last years exam). I reviewed this today. The right lower lobe nodule shows continued stability. The right upper lobe nodule is 3 mm and has not changed (almost 2 years of stability). She has cut her cigarettes down some, now less than half a pack a day. She is active, does not have much dyspnea except with stairs. She is interested in cutting down her cigarettes. She uses albuterol rarely, about 2x a month when she hears wheeze. Has a daily cough, sometimes productive.      Objective:   Physical Exam Filed Vitals:   09/18/15 1334  BP: 126/82  Pulse: 73  Height: 5' 1.5" (1.562 m)  Weight: 179 lb (81.194 kg)  SpO2: 95%   Gen: Pleasant, obese woman, in no distress,  normal affect  ENT: No lesions,  mouth clear,  oropharynx clear, no postnasal drip  Neck: No JVD, no TMG, no carotid bruits  Lungs: No use of accessory muscles, small volumes, clear without rales or rhonchi  Cardiovascular: RRR, heart sounds normal, no murmur or gallops, no peripheral edema  Musculoskeletal: No deformities, no cyanosis or clubbing  Neuro: alert, non focal  Skin: Warm, no lesions or rashes  Assessment & Plan:   Pulmonary nodules 7-8 mm right lower lobe nodule is unchanged and is almost certainly benign. 3 mm right upper lobe nodule is stable for almost 2 years. We will follow annually in coordination with her CT scan for her thoracic aortic aneurysm.  Tobacco use disorder Discussed cessation today. She would like to cut down and we talked about techniques to accomplish this. She is not ready to set a quit date  COPD (chronic obstructive pulmonary disease) (HCC) Continue albuterol when necessary as her only bronchodilator. She occasionally has used Spiriva for a day but I have encouraged her to stop this since we have  not committed to maintenance therapy at this time.     Baltazar Apo, MD, PhD 09/18/2015, 2:09 PM [facility] [phone] or if no answer [phone]

## 2015-09-18 NOTE — Assessment & Plan Note (Signed)
Continue albuterol when necessary as her only bronchodilator. She occasionally has used Spiriva for a day but I have encouraged her to stop this since we have not committed to maintenance therapy at this time.

## 2015-09-18 NOTE — Patient Instructions (Signed)
Pulmonary nodules are stable compared with the prior CT scan of the chest. We will repeat this when it's time to follow your thoracic aortic aneurysm with Dr. Darcey Nora.  Take albuterol 2 puffs up to every 4 hours if needed for shortness of breath.  Continue to work on decreasing smoking Follow with Dr Lamonte Sakai in 12 months or sooner if you have any problems

## 2015-09-18 NOTE — Assessment & Plan Note (Signed)
7-8 mm right lower lobe nodule is unchanged and is almost certainly benign. 3 mm right upper lobe nodule is stable for almost 2 years. We will follow annually in coordination with her CT scan for her thoracic aortic aneurysm.

## 2015-09-18 NOTE — Assessment & Plan Note (Signed)
Discussed cessation today. She would like to cut down and we talked about techniques to accomplish this. She is not ready to set a quit date

## 2015-09-27 ENCOUNTER — Encounter: Payer: Self-pay | Admitting: Cardiothoracic Surgery

## 2015-09-27 ENCOUNTER — Other Ambulatory Visit: Payer: PPO

## 2015-09-27 ENCOUNTER — Ambulatory Visit (INDEPENDENT_AMBULATORY_CARE_PROVIDER_SITE_OTHER): Payer: PPO | Admitting: Cardiothoracic Surgery

## 2015-09-27 VITALS — BP 140/90 | HR 76 | Resp 20 | Ht 61.5 in | Wt 179.0 lb

## 2015-09-27 DIAGNOSIS — R911 Solitary pulmonary nodule: Secondary | ICD-10-CM | POA: Diagnosis not present

## 2015-09-27 DIAGNOSIS — I712 Thoracic aortic aneurysm, without rupture, unspecified: Secondary | ICD-10-CM
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
## 2015-10-09 DIAGNOSIS — E2749 Other adrenocortical insufficiency: Secondary | ICD-10-CM | POA: Diagnosis not present

## 2015-10-09 DIAGNOSIS — E038 Other specified hypothyroidism: Secondary | ICD-10-CM | POA: Diagnosis not present

## 2015-10-09 DIAGNOSIS — E2839 Other primary ovarian failure: Secondary | ICD-10-CM | POA: Diagnosis not present

## 2015-10-11 DIAGNOSIS — E018 Other iodine-deficiency related thyroid disorders and allied conditions: Secondary | ICD-10-CM | POA: Diagnosis not present

## 2015-10-11 DIAGNOSIS — E663 Overweight: Secondary | ICD-10-CM | POA: Diagnosis not present

## 2015-10-11 DIAGNOSIS — N951 Menopausal and female climacteric states: Secondary | ICD-10-CM | POA: Diagnosis not present

## 2015-10-11 DIAGNOSIS — L632 Ophiasis: Secondary | ICD-10-CM | POA: Diagnosis not present

## 2015-10-11 DIAGNOSIS — E2839 Other primary ovarian failure: Secondary | ICD-10-CM | POA: Diagnosis not present

## 2015-10-13 ENCOUNTER — Other Ambulatory Visit: Payer: Self-pay | Admitting: Gastroenterology

## 2015-10-13 DIAGNOSIS — K219 Gastro-esophageal reflux disease without esophagitis: Secondary | ICD-10-CM | POA: Diagnosis not present

## 2015-10-13 DIAGNOSIS — R1084 Generalized abdominal pain: Secondary | ICD-10-CM | POA: Diagnosis not present

## 2015-10-13 DIAGNOSIS — R1310 Dysphagia, unspecified: Secondary | ICD-10-CM | POA: Diagnosis not present

## 2015-10-13 DIAGNOSIS — R140 Abdominal distension (gaseous): Secondary | ICD-10-CM | POA: Diagnosis not present

## 2015-10-13 DIAGNOSIS — IMO0001 Reserved for inherently not codable concepts without codable children: Secondary | ICD-10-CM

## 2015-10-24 DIAGNOSIS — R350 Frequency of micturition: Secondary | ICD-10-CM | POA: Diagnosis not present

## 2015-10-24 DIAGNOSIS — R1030 Lower abdominal pain, unspecified: Secondary | ICD-10-CM | POA: Diagnosis not present

## 2015-10-26 ENCOUNTER — Ambulatory Visit
Admission: RE | Admit: 2015-10-26 | Discharge: 2015-10-26 | Disposition: A | Discharge status: Home / Self Care | Payer: PPO | Source: Ambulatory Visit | Attending: Gastroenterology | Admitting: Gastroenterology

## 2015-10-26 DIAGNOSIS — R1084 Generalized abdominal pain: Secondary | ICD-10-CM

## 2015-10-26 DIAGNOSIS — IMO0001 Reserved for inherently not codable concepts without codable children: Secondary | ICD-10-CM

## 2015-10-26 DIAGNOSIS — R102 Pelvic and perineal pain: Secondary | ICD-10-CM | POA: Diagnosis not present

## 2015-10-26 DIAGNOSIS — R109 Unspecified abdominal pain: Secondary | ICD-10-CM | POA: Diagnosis not present

## 2015-11-08 DIAGNOSIS — I712 Thoracic aortic aneurysm, without rupture: Secondary | ICD-10-CM | POA: Diagnosis not present

## 2015-11-08 DIAGNOSIS — Z23 Encounter for immunization: Secondary | ICD-10-CM | POA: Diagnosis not present

## 2015-11-08 DIAGNOSIS — E7800 Pure hypercholesterolemia, unspecified: Secondary | ICD-10-CM | POA: Diagnosis not present

## 2015-11-08 DIAGNOSIS — Z0000 Encounter for general adult medical examination without abnormal findings: Secondary | ICD-10-CM | POA: Diagnosis not present

## 2015-11-08 DIAGNOSIS — E039 Hypothyroidism, unspecified: Secondary | ICD-10-CM | POA: Diagnosis not present

## 2015-11-08 DIAGNOSIS — Z7984 Long term (current) use of oral hypoglycemic drugs: Secondary | ICD-10-CM | POA: Diagnosis not present

## 2015-11-08 DIAGNOSIS — J449 Chronic obstructive pulmonary disease, unspecified: Secondary | ICD-10-CM | POA: Diagnosis not present

## 2015-11-08 DIAGNOSIS — I10 Essential (primary) hypertension: Secondary | ICD-10-CM | POA: Diagnosis not present

## 2015-11-08 DIAGNOSIS — Z1389 Encounter for screening for other disorder: Secondary | ICD-10-CM | POA: Diagnosis not present

## 2015-11-08 DIAGNOSIS — K219 Gastro-esophageal reflux disease without esophagitis: Secondary | ICD-10-CM | POA: Diagnosis not present

## 2015-11-08 DIAGNOSIS — E119 Type 2 diabetes mellitus without complications: Secondary | ICD-10-CM | POA: Diagnosis not present

## 2015-11-21 DIAGNOSIS — E038 Other specified hypothyroidism: Secondary | ICD-10-CM | POA: Diagnosis not present

## 2015-11-21 DIAGNOSIS — E2749 Other adrenocortical insufficiency: Secondary | ICD-10-CM | POA: Diagnosis not present

## 2015-11-21 DIAGNOSIS — E2839 Other primary ovarian failure: Secondary | ICD-10-CM | POA: Diagnosis not present

## 2015-11-23 DIAGNOSIS — Z1231 Encounter for screening mammogram for malignant neoplasm of breast: Secondary | ICD-10-CM | POA: Diagnosis not present

## 2015-11-23 DIAGNOSIS — Z803 Family history of malignant neoplasm of breast: Secondary | ICD-10-CM | POA: Diagnosis not present

## 2015-11-29 DIAGNOSIS — N951 Menopausal and female climacteric states: Secondary | ICD-10-CM | POA: Diagnosis not present

## 2015-11-29 DIAGNOSIS — E663 Overweight: Secondary | ICD-10-CM | POA: Diagnosis not present

## 2015-11-29 DIAGNOSIS — E2839 Other primary ovarian failure: Secondary | ICD-10-CM | POA: Diagnosis not present

## 2015-11-29 DIAGNOSIS — L638 Other alopecia areata: Secondary | ICD-10-CM | POA: Diagnosis not present

## 2015-11-29 DIAGNOSIS — E038 Other specified hypothyroidism: Secondary | ICD-10-CM | POA: Diagnosis not present

## 2016-01-18 DIAGNOSIS — R0789 Other chest pain: Secondary | ICD-10-CM | POA: Diagnosis not present

## 2016-01-18 DIAGNOSIS — R946 Abnormal results of thyroid function studies: Secondary | ICD-10-CM | POA: Diagnosis not present

## 2016-01-18 DIAGNOSIS — N644 Mastodynia: Secondary | ICD-10-CM | POA: Diagnosis not present

## 2016-02-20 DIAGNOSIS — N644 Mastodynia: Secondary | ICD-10-CM | POA: Diagnosis not present

## 2016-03-01 DIAGNOSIS — H02834 Dermatochalasis of left upper eyelid: Secondary | ICD-10-CM | POA: Diagnosis not present

## 2016-03-01 DIAGNOSIS — H25813 Combined forms of age-related cataract, bilateral: Secondary | ICD-10-CM | POA: Diagnosis not present

## 2016-03-01 DIAGNOSIS — H40053 Ocular hypertension, bilateral: Secondary | ICD-10-CM | POA: Diagnosis not present

## 2016-03-01 DIAGNOSIS — H04123 Dry eye syndrome of bilateral lacrimal glands: Secondary | ICD-10-CM | POA: Diagnosis not present

## 2016-03-01 DIAGNOSIS — H02831 Dermatochalasis of right upper eyelid: Secondary | ICD-10-CM | POA: Diagnosis not present

## 2016-05-09 DIAGNOSIS — I10 Essential (primary) hypertension: Secondary | ICD-10-CM | POA: Diagnosis not present

## 2016-05-09 DIAGNOSIS — E7800 Pure hypercholesterolemia, unspecified: Secondary | ICD-10-CM | POA: Diagnosis not present

## 2016-05-09 DIAGNOSIS — E039 Hypothyroidism, unspecified: Secondary | ICD-10-CM | POA: Diagnosis not present

## 2016-05-09 DIAGNOSIS — Z7984 Long term (current) use of oral hypoglycemic drugs: Secondary | ICD-10-CM | POA: Diagnosis not present

## 2016-05-09 DIAGNOSIS — E1151 Type 2 diabetes mellitus with diabetic peripheral angiopathy without gangrene: Secondary | ICD-10-CM | POA: Diagnosis not present

## 2016-07-22 DIAGNOSIS — H04123 Dry eye syndrome of bilateral lacrimal glands: Secondary | ICD-10-CM | POA: Diagnosis not present

## 2016-07-22 DIAGNOSIS — H5201 Hypermetropia, right eye: Secondary | ICD-10-CM | POA: Diagnosis not present

## 2016-07-22 DIAGNOSIS — H40013 Open angle with borderline findings, low risk, bilateral: Secondary | ICD-10-CM | POA: Diagnosis not present

## 2016-08-16 ENCOUNTER — Other Ambulatory Visit: Payer: Self-pay | Admitting: Cardiothoracic Surgery

## 2016-08-16 DIAGNOSIS — R918 Other nonspecific abnormal finding of lung field: Secondary | ICD-10-CM

## 2016-09-23 ENCOUNTER — Ambulatory Visit (INDEPENDENT_AMBULATORY_CARE_PROVIDER_SITE_OTHER): Payer: PPO | Admitting: Emergency Medicine

## 2016-09-23 ENCOUNTER — Encounter: Payer: Self-pay | Admitting: Emergency Medicine

## 2016-09-23 DIAGNOSIS — J449 Chronic obstructive pulmonary disease, unspecified: Secondary | ICD-10-CM

## 2016-09-23 DIAGNOSIS — J301 Allergic rhinitis due to pollen: Secondary | ICD-10-CM

## 2016-09-23 DIAGNOSIS — F17200 Nicotine dependence, unspecified, uncomplicated: Secondary | ICD-10-CM | POA: Diagnosis not present

## 2016-09-23 DIAGNOSIS — J309 Allergic rhinitis, unspecified: Secondary | ICD-10-CM

## 2016-09-23 DIAGNOSIS — R918 Other nonspecific abnormal finding of lung field: Secondary | ICD-10-CM

## 2016-09-23 HISTORY — DX: Allergic rhinitis, unspecified: J30.9

## 2016-09-23 NOTE — Assessment & Plan Note (Signed)
Stable by serial CTs. She does follow with thoracic surgery for her aorta.

## 2016-09-23 NOTE — Patient Instructions (Addendum)
Continue to follow CT chest and keep visits with Dr Darcey Nora.  Consider starting loratadine 10mg  daily to see if this helps with sneezing and coughing.  Take albuterol 2 puffs if needed for shortness of breath.  Continue to to work on decreasing your cigarettes  Follow with Dr Lamonte Sakai in 12 months or sooner if you have any problems

## 2016-09-23 NOTE — Assessment & Plan Note (Signed)
Patient describes sneezing, some increased cough. We will try loratadine to see if she benefits.

## 2016-09-23 NOTE — Progress Notes (Signed)
Subjective:    Patient ID: Angel Tanner, female    DOB: October 05, 1940, 76 y.o.   MRN: [id number]  HPI 76  yo woman, smoker ( 23 pk-yrs),  1mm RLL pulmonary nodule.  The nodule was discovered originally on CT scan abd/pelvis for abdominal pain.   Follow up Lung Nodule  Patient returns for a follow-up visit We have been following a small pulmonary nodule in the right lower lobe. Last CT showed an 8 mm right lower lobe pulmonary nodule Repeat CT on December 2 showed a stable 7 mm right lower lobe pulmonary nodule. We reviewed these test results. Patient continued to smoke, smoking cessation was discussed He denies any chest pain, hemoptysis, orthopnea, PND, weight loss, or leg swelling Previous PFTs in March of this year showed preserved lung function.  ROV 08/26/14 -- follow-up visit for history of tobacco abuse and a 7-8 mm right lower lobe pulmonary nodule first seen 11/12/13, but in retrospect dating back to 05/30/08. She underwent a repeat CT scan of the chest earlier today and I have personally reviewed this. It shows that her right lower lobe nodule is unchanged in size going all the way back to 2010. She also has a 3 mm right upper lobe nodule that has not changed since 11/12/13.  She also has an enlarged ascending aorta on both scans - has never been formally evaluated for this. She continues to smoke half a pack a day  ROV 03/14/15 -- follow up visit for hx of tobacco, pulmonary nodules as descrbed above.  She was seen by Dr Darcey Nora regarding her aortic aneurysm. He is planning to follow the TAA with serial scans.  She continues to smoke, FEV1 1.08 (79%). She feels that her breathing has been doing well. She has albuterol available, uses it rarely, about 1-2x a week. She had a recent URI that she treated with OTC meds. She is smoking 1/2 pk a day.   ROV 09/18/15 -- patient has a history of tobacco use and COPD. She also has pulmonary nodules: An 8 mm right lower lobe nodule that dates back  to 2010, a 3 mm right upper lobe nodule noted 11/12/13. She had a CT scan of the chest on 08/22/15 to follow these nodules and also her thoracic aorta ( noted be stable compared with last years exam). I reviewed this today. The right lower lobe nodule shows continued stability. The right upper lobe nodule is 3 mm and has not changed (almost 2 years of stability). She has cut her cigarettes down some, now less than half a pack a day. She is active, does not have much dyspnea except with stairs. She is interested in cutting down her cigarettes. She uses albuterol rarely, about 2x a month when she hears wheeze. Has a daily cough, sometimes productive.   ROV 09/23/16 -- this follow-up visit for patient with a history of COPD. She also has pulmonary nodules that have been followed serial CT scans and have been stable. She has continued to smoke, currently smoking approximately . She is having leg fatigue. Also a bit more SOB with exertion - including chores around the house.  She has albuterol that she uses rarely, usually when she wheezes. she was given Spiriva but has only used prn. Discussed with her today the difference between prn and scheduled BD's. She has some cough, wheezes approx . She is smoking 1/2 pack a day. Discussed cessation w her.     Objective:   Physical Exam Vitals:  09/23/16 1056  BP: 128/68  Pulse: 85  SpO2: 93%  Weight: 174 lb (78.9 kg)  Height: 5' 1.5" (1.562 m)   Gen: Pleasant, obese woman, in no distress,  normal affect  ENT: No lesions,  mouth clear,  oropharynx clear, no postnasal drip  Neck: No JVD, no TMG, no carotid bruits  Lungs: No use of accessory muscles, small volumes, clear without rales or rhonchi  Cardiovascular: RRR, heart sounds normal, no murmur or gallops, no peripheral edema  Musculoskeletal: No deformities, no cyanosis or clubbing  Neuro: alert, non focal  Skin: Warm, no lesions or rashes       Assessment & Plan:   COPD (chronic obstructive  pulmonary disease) ([place]) She does describe some exertional limitation. She does not want to start scheduled medication at this time. She does use albuterol as needed. I asked her to continue this. Most important thing for her to do is to stop smoking. We discussed this in detail today.  Pulmonary nodules Stable by serial CTs. She does follow with thoracic surgery for her aorta.   Tobacco use disorder Discussed cessation with her today.  Allergic rhinitis Patient describes sneezing, some increased cough. We will try loratadine to see if she benefits.    Baltazar Apo, MD, PhD 09/23/2016, 11:20 AM [facility] [phone] or if no answer [phone]

## 2016-09-23 NOTE — Assessment & Plan Note (Signed)
Discussed cessation with her today 

## 2016-09-23 NOTE — Assessment & Plan Note (Signed)
She does describe some exertional limitation. She does not want to start scheduled medication at this time. She does use albuterol as needed. I asked her to continue this. Most important thing for her to do is to stop smoking. We discussed this in detail today.

## 2016-09-25 ENCOUNTER — Encounter (INDEPENDENT_AMBULATORY_CARE_PROVIDER_SITE_OTHER): Payer: PPO | Admitting: Cardiothoracic Surgery

## 2016-09-25 ENCOUNTER — Ambulatory Visit
Admission: RE | Admit: 2016-09-25 | Discharge: 2016-09-25 | Disposition: A | Discharge status: Home / Self Care | Payer: PPO | Source: Ambulatory Visit | Attending: Cardiothoracic Surgery | Admitting: Cardiothoracic Surgery

## 2016-09-25 VITALS — Ht 61.5 in | Wt 174.0 lb

## 2016-09-25 DIAGNOSIS — R911 Solitary pulmonary nodule: Secondary | ICD-10-CM | POA: Diagnosis not present

## 2016-09-25 DIAGNOSIS — R918 Other nonspecific abnormal finding of lung field: Secondary | ICD-10-CM

## 2016-09-25 MED ORDER — IOPAMIDOL (ISOVUE-300) INJECTION 61%
75.0000 mL | Freq: Once | INTRAVENOUS | Status: AC | PRN
  Administered 2016-09-25: 75 mL via INTRAVENOUS

## 2016-09-25 MED ORDER — IOPAMIDOL (ISOVUE-300) INJECTION 61%: 75.0000 mL | Freq: Once | INTRAVENOUS | Status: DC | PRN

## 2016-10-16 ENCOUNTER — Ambulatory Visit (INDEPENDENT_AMBULATORY_CARE_PROVIDER_SITE_OTHER): Payer: PPO | Admitting: Cardiothoracic Surgery

## 2016-10-16 ENCOUNTER — Encounter: Payer: Self-pay | Admitting: Cardiothoracic Surgery

## 2016-10-16 VITALS — BP 140/90 | HR 92 | Resp 20 | Ht 61.5 in | Wt 175.0 lb

## 2016-10-16 DIAGNOSIS — I712 Thoracic aortic aneurysm, without rupture, unspecified: Secondary | ICD-10-CM

## 2016-10-16 DIAGNOSIS — R911 Solitary pulmonary nodule: Secondary | ICD-10-CM | POA: Diagnosis not present
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
## 2016-10-28 DIAGNOSIS — F17210 Nicotine dependence, cigarettes, uncomplicated: Secondary | ICD-10-CM | POA: Diagnosis not present

## 2016-10-28 DIAGNOSIS — J441 Chronic obstructive pulmonary disease with (acute) exacerbation: Secondary | ICD-10-CM | POA: Diagnosis not present

## 2016-11-22 DIAGNOSIS — I739 Peripheral vascular disease, unspecified: Secondary | ICD-10-CM | POA: Diagnosis not present

## 2016-11-22 DIAGNOSIS — E7800 Pure hypercholesterolemia, unspecified: Secondary | ICD-10-CM | POA: Diagnosis not present

## 2016-11-22 DIAGNOSIS — K219 Gastro-esophageal reflux disease without esophagitis: Secondary | ICD-10-CM | POA: Diagnosis not present

## 2016-11-22 DIAGNOSIS — Z7984 Long term (current) use of oral hypoglycemic drugs: Secondary | ICD-10-CM | POA: Diagnosis not present

## 2016-11-22 DIAGNOSIS — E1165 Type 2 diabetes mellitus with hyperglycemia: Secondary | ICD-10-CM | POA: Diagnosis not present

## 2016-11-22 DIAGNOSIS — I10 Essential (primary) hypertension: Secondary | ICD-10-CM | POA: Diagnosis not present

## 2016-11-22 DIAGNOSIS — I712 Thoracic aortic aneurysm, without rupture: Secondary | ICD-10-CM | POA: Diagnosis not present

## 2016-11-22 DIAGNOSIS — Z1389 Encounter for screening for other disorder: Secondary | ICD-10-CM | POA: Diagnosis not present

## 2016-11-22 DIAGNOSIS — J441 Chronic obstructive pulmonary disease with (acute) exacerbation: Secondary | ICD-10-CM | POA: Diagnosis not present

## 2016-11-22 DIAGNOSIS — Z23 Encounter for immunization: Secondary | ICD-10-CM | POA: Diagnosis not present

## 2016-11-22 DIAGNOSIS — Z0000 Encounter for general adult medical examination without abnormal findings: Secondary | ICD-10-CM | POA: Diagnosis not present

## 2016-11-22 DIAGNOSIS — E1151 Type 2 diabetes mellitus with diabetic peripheral angiopathy without gangrene: Secondary | ICD-10-CM | POA: Diagnosis not present

## 2016-11-28 ENCOUNTER — Ambulatory Visit
Admission: RE | Admit: 2016-11-28 | Discharge: 2016-11-28 | Disposition: A | Discharge status: Home / Self Care | Payer: PPO | Source: Ambulatory Visit | Attending: Nurse Practitioner | Admitting: Nurse Practitioner

## 2016-11-28 ENCOUNTER — Other Ambulatory Visit: Payer: Self-pay | Admitting: Nurse Practitioner

## 2016-11-28 DIAGNOSIS — R05 Cough: Secondary | ICD-10-CM | POA: Diagnosis not present

## 2016-11-28 DIAGNOSIS — J441 Chronic obstructive pulmonary disease with (acute) exacerbation: Secondary | ICD-10-CM

## 2016-11-28 DIAGNOSIS — Z1231 Encounter for screening mammogram for malignant neoplasm of breast: Secondary | ICD-10-CM | POA: Diagnosis not present

## 2016-11-28 DIAGNOSIS — Z716 Tobacco abuse counseling: Secondary | ICD-10-CM | POA: Diagnosis not present

## 2016-11-28 DIAGNOSIS — F17210 Nicotine dependence, cigarettes, uncomplicated: Secondary | ICD-10-CM | POA: Diagnosis not present

## 2016-11-28 DIAGNOSIS — Z803 Family history of malignant neoplasm of breast: Secondary | ICD-10-CM | POA: Diagnosis not present

## 2016-12-17 DIAGNOSIS — H40053 Ocular hypertension, bilateral: Secondary | ICD-10-CM | POA: Diagnosis not present

## 2017-01-14 IMAGING — CT CT CHEST W/O CM
2 of 3 series · 15 of 36 positions shown, 18 images · IV contrast (Omnipaque 300)
Comparison: 02/02/2014

ADDENDUM:
The original report was by Dr. Ryzard Rohde. The following
addendum is by Dr. Ryzard Rohde:

An additional IMPRESSION # 5 should read:
"5.  Nine millimeter angiomyolipoma of the left kidney upper pole."
CLINICAL DATA: Lung nodule followup
EXAM:
CT CHEST WITHOUT CONTRAST
TECHNIQUE: Multidetector CT imaging of the chest was performed following the
standard protocol without IV contrast.

[Series 2: chest routine with · axial · 0.65mm/px · z∈[-274,-40]mm · 12 of 57 slices shown, 15 images]
[im 5/57  mediastinal]
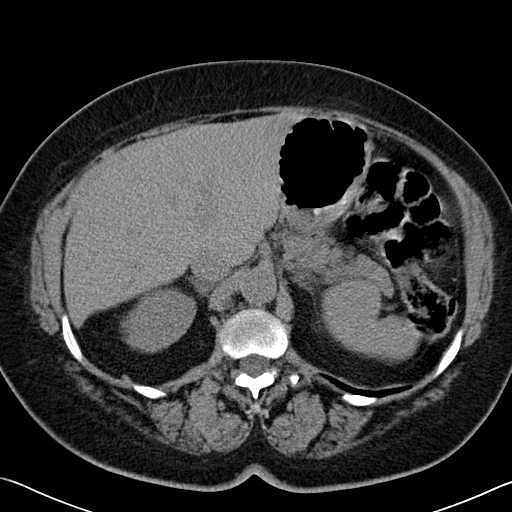
[im 5/57  lung]
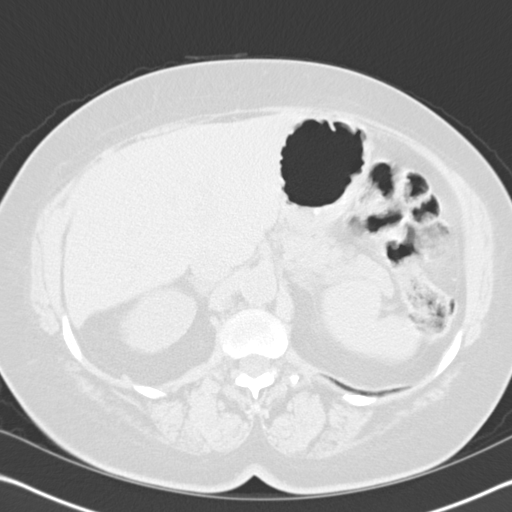
[im 9/57  lung]
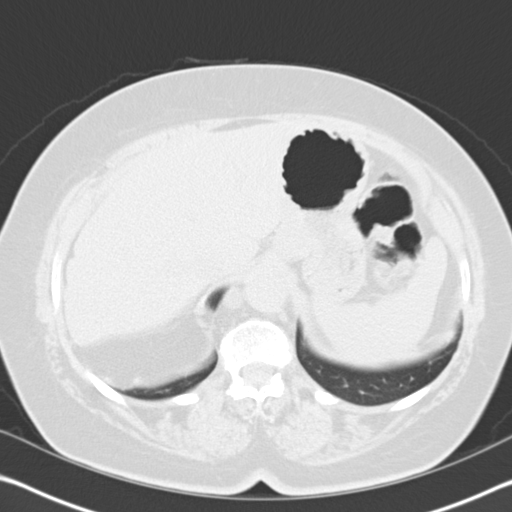
[im 13/57  lung]
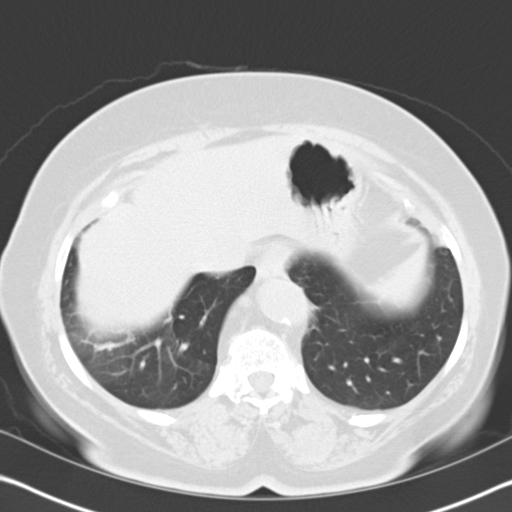
[im 17/57  lung]
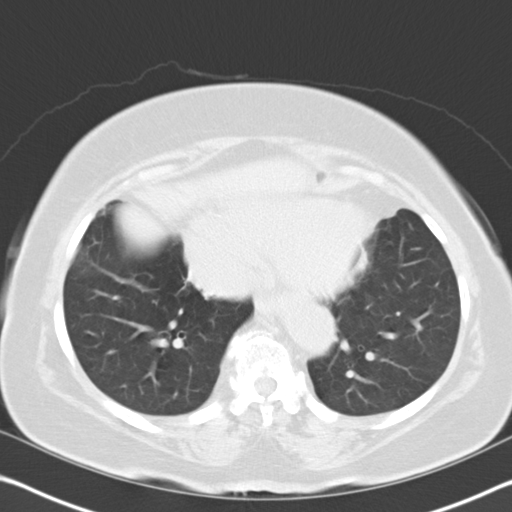
[im 21/57  mediastinal]
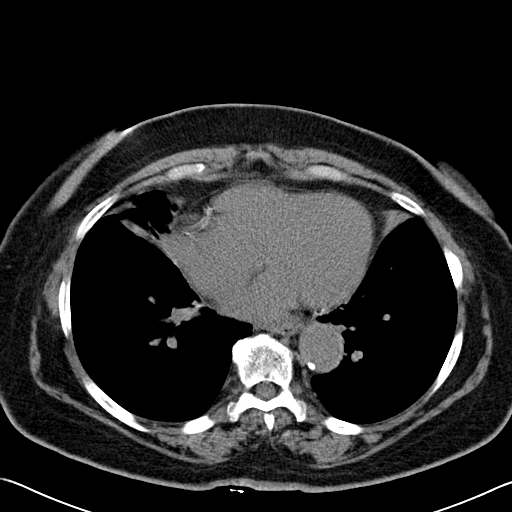
[im 21/57  lung]
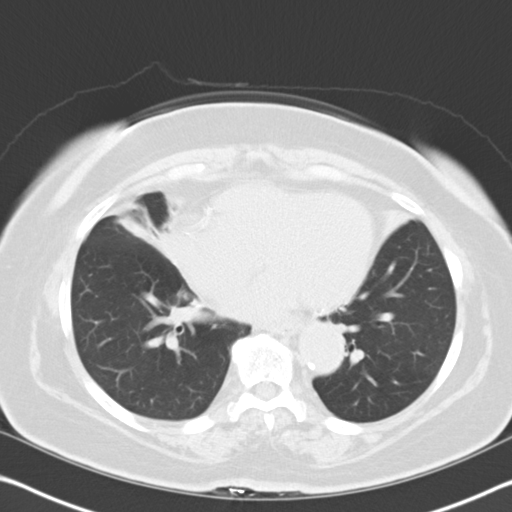
[im 25/57  lung]
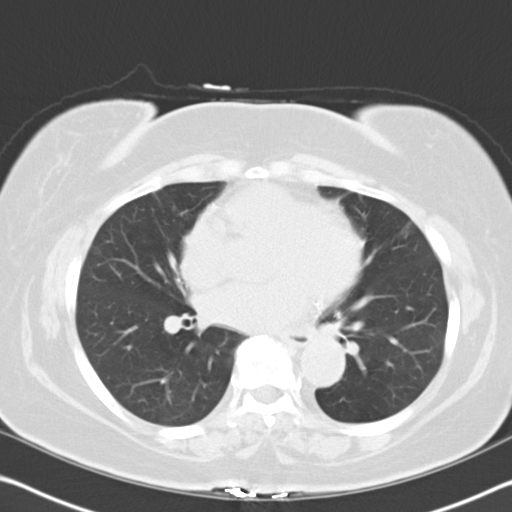
[im 32/57  lung]
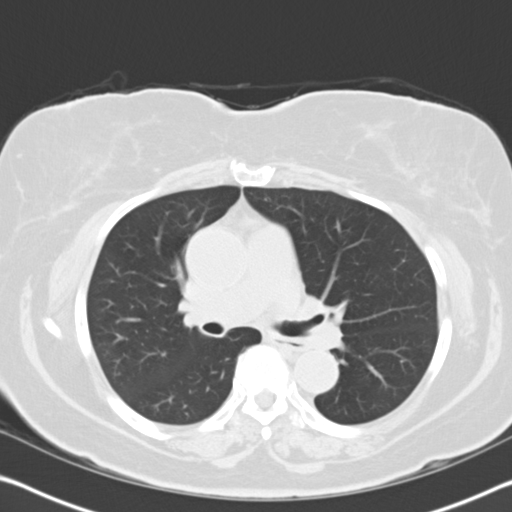
[im 36/57  lung]
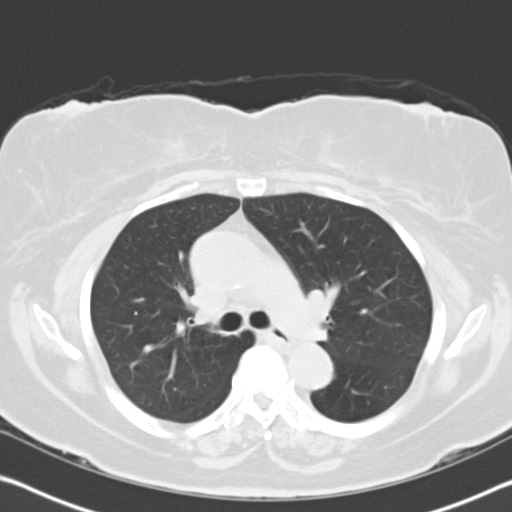
[im 40/57  mediastinal]
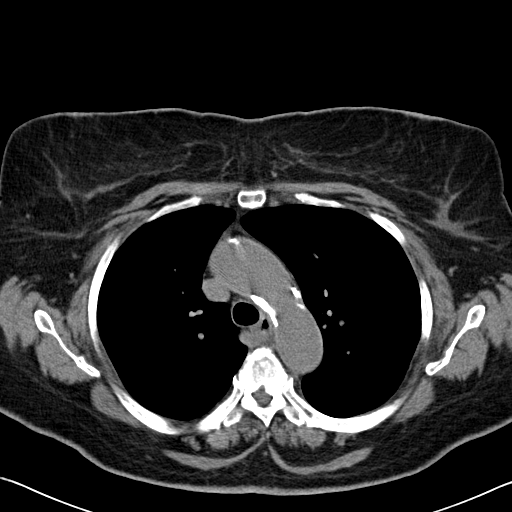
[im 40/57  lung]
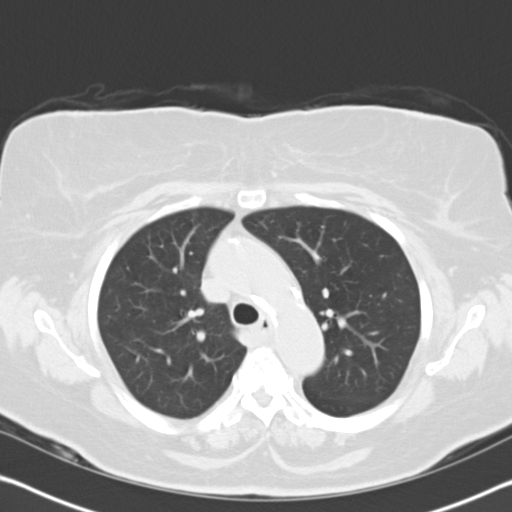
[im 44/57  lung]
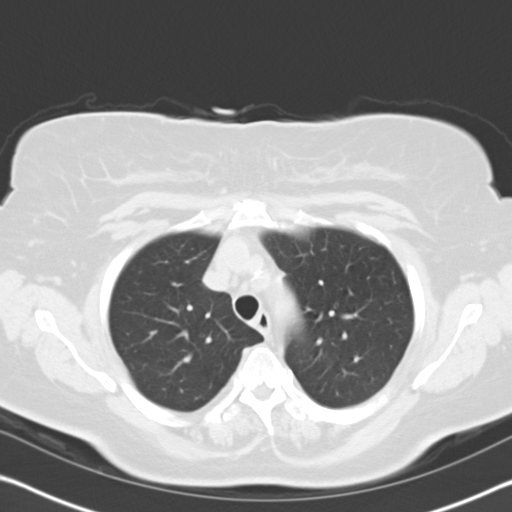
[im 48/57  lung]
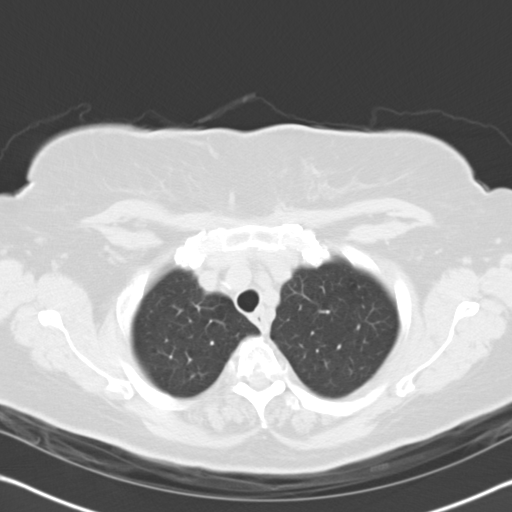
[im 52/57  lung]
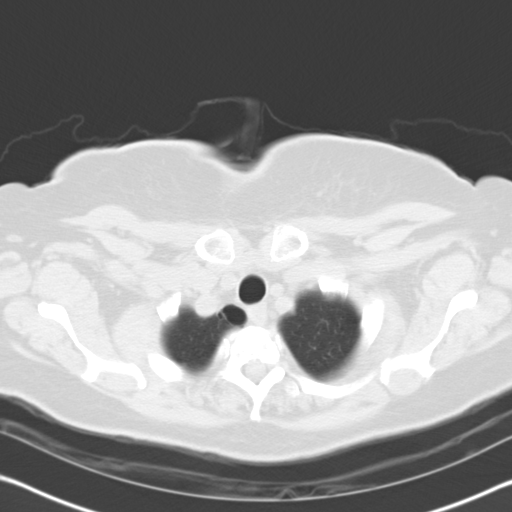

[Series 602: cor · coronal · 0.65mm/px · 3 of 101 slices shown]
[im 21/101  lung]
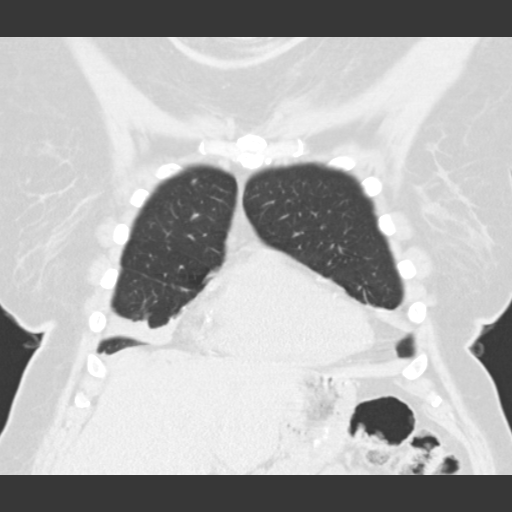
[im 41/101  lung]
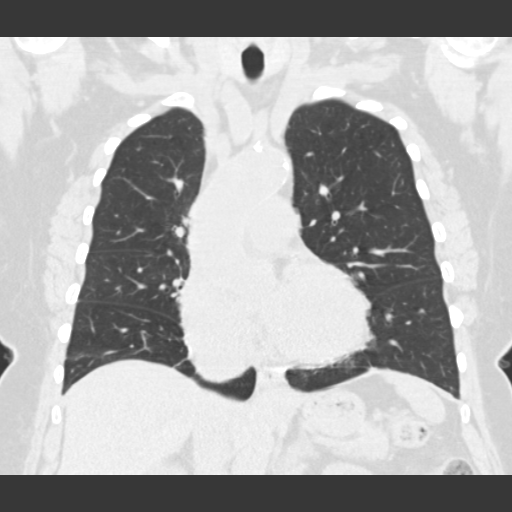
[im 61/101  lung]
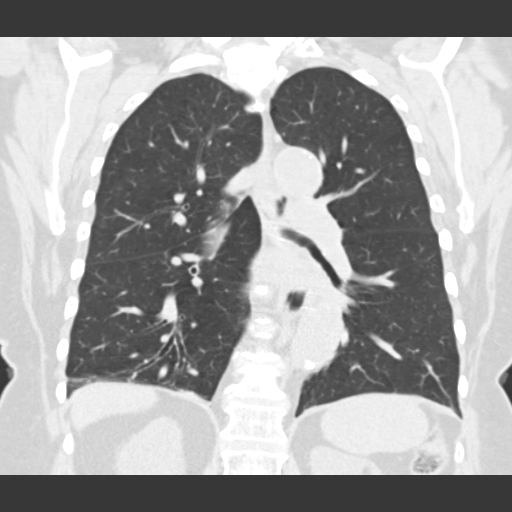

[15 of 36 positions shown; findings below may reference images not displayed]

FINDINGS: Mediastinum/Nodes: Coronary, aortic arch, and branch vessel
atherosclerotic vascular disease. Prominent cisterna chyli
(incidental).

Lungs/Pleura: Right lower lobe pulmonary nodule with speckled
calcification, 0.7 by 0.6 cm, previously measured the same on
05/30/2008.

Right upper lobe indistinctly marginated 3 millimeter pulmonary
nodule, image 19 series 3, no significant change earliest available
comparison of from 11/12/2013.

Upper abdomen: We partially image a 9 millimeter angiomyolipoma of
the left kidney upper pole.

Musculoskeletal: Thoracic spondylosis with multilevel posterior
osseous ridging in the upper thoracic spine.
IMPRESSION: 1. The right lower lobe pulmonary nodule is unchanged from
05/30/2008. Given the greater than 6 years of total stability, this
is considered benign and requires no other followup.
2. There is a right upper lobe indistinctly marginated 3 millimeter
pulmonary nodule. This is unchanged from the earliest available
comparison that also includes this vertical level (11/12/2013).
Accordingly this documents 9 months of stability. In patients with a
history of smoking or high risk factor for lung cancer, typical
guidelines call for 1 year of observation of such nodules in order
to ensure stability. I would recommend a followup CT to satisfy
these guidelines and ensure that this lesion is benign. Given the
presence of the ascending thoracic aortic aneurysm (see impression
#4 below), attention to this tiny nodule could be directed on the
recommended follow up CTA in 1 years time.
3. Coronary, aortic arch, and branch vessel atherosclerotic vascular
disease.
4. Ascending thoracic aortic aneurysm, 4.2 cm diameter. Recommend
annual imaging followup by CTA or MRA. This recommendation follows
1787 ACCF/AHA/AATS/ACR/ASA/SCA/IATLONG/ASGHAR/TIGER/LUDO Guidelines for the
Diagnosis and Management of Patients with Thoracic Aortic Disease.
Circulation. 1787; 121: [id number]

## 2017-02-03 DIAGNOSIS — M7542 Impingement syndrome of left shoulder: Secondary | ICD-10-CM | POA: Diagnosis not present

## 2017-03-05 DIAGNOSIS — M7542 Impingement syndrome of left shoulder: Secondary | ICD-10-CM | POA: Diagnosis not present

## 2017-04-10 DIAGNOSIS — J208 Acute bronchitis due to other specified organisms: Secondary | ICD-10-CM | POA: Diagnosis not present

## 2017-05-22 DIAGNOSIS — E663 Overweight: Secondary | ICD-10-CM | POA: Diagnosis not present

## 2017-05-22 DIAGNOSIS — E119 Type 2 diabetes mellitus without complications: Secondary | ICD-10-CM | POA: Diagnosis not present

## 2017-05-22 DIAGNOSIS — G4730 Sleep apnea, unspecified: Secondary | ICD-10-CM | POA: Diagnosis not present

## 2017-05-22 DIAGNOSIS — Z6836 Body mass index (BMI) 36.0-36.9, adult: Secondary | ICD-10-CM | POA: Diagnosis not present

## 2017-05-22 DIAGNOSIS — E039 Hypothyroidism, unspecified: Secondary | ICD-10-CM | POA: Diagnosis not present

## 2017-05-22 DIAGNOSIS — E7800 Pure hypercholesterolemia, unspecified: Secondary | ICD-10-CM | POA: Diagnosis not present

## 2017-05-22 DIAGNOSIS — I739 Peripheral vascular disease, unspecified: Secondary | ICD-10-CM | POA: Diagnosis not present

## 2017-05-22 DIAGNOSIS — J449 Chronic obstructive pulmonary disease, unspecified: Secondary | ICD-10-CM | POA: Diagnosis not present

## 2017-05-22 DIAGNOSIS — I10 Essential (primary) hypertension: Secondary | ICD-10-CM | POA: Diagnosis not present

## 2017-05-30 DIAGNOSIS — E1151 Type 2 diabetes mellitus with diabetic peripheral angiopathy without gangrene: Secondary | ICD-10-CM | POA: Diagnosis not present

## 2017-05-30 DIAGNOSIS — E039 Hypothyroidism, unspecified: Secondary | ICD-10-CM | POA: Diagnosis not present

## 2017-05-30 DIAGNOSIS — Z7984 Long term (current) use of oral hypoglycemic drugs: Secondary | ICD-10-CM | POA: Diagnosis not present

## 2017-05-30 DIAGNOSIS — E785 Hyperlipidemia, unspecified: Secondary | ICD-10-CM | POA: Diagnosis not present

## 2017-05-30 DIAGNOSIS — J441 Chronic obstructive pulmonary disease with (acute) exacerbation: Secondary | ICD-10-CM | POA: Diagnosis not present

## 2017-05-30 DIAGNOSIS — E119 Type 2 diabetes mellitus without complications: Secondary | ICD-10-CM | POA: Diagnosis not present

## 2017-05-30 DIAGNOSIS — I10 Essential (primary) hypertension: Secondary | ICD-10-CM | POA: Diagnosis not present

## 2017-05-30 DIAGNOSIS — H409 Unspecified glaucoma: Secondary | ICD-10-CM | POA: Diagnosis not present

## 2017-06-05 ENCOUNTER — Encounter: Payer: Self-pay | Admitting: Emergency Medicine

## 2017-06-05 ENCOUNTER — Ambulatory Visit: Payer: PPO | Admitting: Emergency Medicine

## 2017-06-05 DIAGNOSIS — G4733 Obstructive sleep apnea (adult) (pediatric): Secondary | ICD-10-CM

## 2017-06-05 DIAGNOSIS — R918 Other nonspecific abnormal finding of lung field: Secondary | ICD-10-CM | POA: Diagnosis not present

## 2017-06-05 MED ORDER — ALBUTEROL SULFATE 108 (90 BASE) MCG/ACT IN AEPB: 2.0000 | INHALATION_SPRAY | RESPIRATORY_TRACT | 5 refills | Status: DC | PRN

## 2017-06-05 NOTE — Assessment & Plan Note (Signed)
And she had difficulty tolerating the mask due to fit and also did not like the noise made by the machine.  She is willing to go back and try CPAP again.  We will arrange for a split-night sleep study, in [place] if possible.

## 2017-06-05 NOTE — Addendum Note (Signed)
Addended by: Parke Poisson E on: 06/05/2017 11:21 AM   Modules accepted: Orders

## 2017-06-05 NOTE — Progress Notes (Signed)
Subjective:    Patient ID: Angel Tanner, female    DOB: 15-Dec-1940, 77 y.o.   MRN: [id number]  HPI 77  yo woman, smoker ( 23 pk-yrs),  21mm RLL pulmonary nodule.  The nodule was discovered originally on CT scan abd/pelvis for abdominal pain.    ROV 09/23/16 -- this follow-up visit for patient with a history of COPD. She also has pulmonary nodules that have been followed serial CT scans and have been stable. She has continued to smoke, currently smoking approximately . She is having leg fatigue. Also a bit more SOB with exertion - including chores around the house.  She has albuterol that she uses rarely, usually when she wheezes. she was given Spiriva but has only used prn. Discussed with her today the difference between prn and scheduled BD's. She has some cough, wheezes approx . She is smoking 1/2 pack a day. Discussed cessation w her.   ROV 06/05/17 --77 year old woman with a history of COPD and tobacco use. She quit smoking in February!!  She has an aortic aneurysm that has been followed with CT scans of the chest by Dr. Darcey Nora.  We have also been following pulmonary nodules on those scans.  Her most recent was 09/25/16 and these nodules are now considered to be benign, she should need follow-up scanning just for the nodules themselves. She is having periods of sleepiness, naps unintentionally even when reading or watching TV. She came off CPAP a few years ago due to difficulty tolerating. She uses ProAir prn, rarely. She does get some dyspnea when she is active. She is having nasal congestion and URI /  allergy sx over the ast few days, increased cough.       Objective:   Physical Exam Vitals:   06/05/17 1057 06/05/17 1058  BP:  112/82  Pulse:  71  SpO2:  100%  Weight: 178 lb (80.7 kg)   Height: 5' 1.5" (1.562 m)    Gen: Pleasant, obese woman, in no distress,  normal affect  ENT: No lesions,  mouth clear,  oropharynx clear, no postnasal drip  Neck: No JVD, no TMG, no carotid  bruits  Lungs: No use of accessory muscles, small volumes, clear without rales or rhonchi  Cardiovascular: RRR, heart sounds normal, no murmur or gallops, no peripheral edema  Musculoskeletal: No deformities, no cyanosis or clubbing  Neuro: alert, non focal  Skin: Warm, no lesions or rashes       Assessment & Plan:   Obstructive sleep apnea And she had difficulty tolerating the mask due to fit and also did not like the noise made by the machine.  She is willing to go back and try CPAP again.  We will arrange for a split-night sleep study, in [place] if possible.  COPD (chronic obstructive pulmonary disease) ([facility]) Not currently on bronchodilator therapy.  She did stop smoking which is fantastic.  I congratulated her for this.  I have asked her to restart albuterol and use it when symptoms occur.  She will keep track of her symptoms and then based on her albuterol use we will decide whether to start maintenance medication.  Allergic rhinitis She questions whether she may have a URI versus allergy symptoms with some increased cough.  I recommended loratadine for her to use and nasal congestion  Pulmonary nodules Stable on serial CT scans of the chest.  She does not need any more CTs to follow the nodules.  She may have repeat scanning as we go  forward for her thoracic aortic aneurysm.    Baltazar Apo, MD, PhD 06/05/2017, 11:17 AM [facility] [phone] or if no answer [phone]

## 2017-06-05 NOTE — Assessment & Plan Note (Signed)
Not currently on bronchodilator therapy.  She did stop smoking which is fantastic.  I congratulated her for this.  I have asked her to restart albuterol and use it when symptoms occur.  She will keep track of her symptoms and then based on her albuterol use we will decide whether to start maintenance medication.

## 2017-06-05 NOTE — Assessment & Plan Note (Signed)
Stable on serial CT scans of the chest.  She does not need any more CTs to follow the nodules.  She may have repeat scanning as we go forward for her thoracic aortic aneurysm.

## 2017-06-05 NOTE — Patient Instructions (Signed)
We will arrange for a split-night sleep study to characterize your sleep apnea. Please use albuterol 2 puffs up to every 4 hours if needed for shortness of breath, wheezing, chest tightness.  We will give you a new prescription for this.  Keep track of how often you use it so that we can decide in the future whether you would benefit from an every day scheduled inhaler medication. Try starting loratadine 10 mg (Claritin) once a day to see if this helps your congestion, allergy symptoms, cough. Congratulations on stopping smoking!  Keep up the good work. You should not need any repeat CT scans to follow your pulmonary nodules.  They have been stable over the years. Follow with Dr Lamonte Sakai next available after your sleep study is done so that we can review the results together.

## 2017-06-05 NOTE — Assessment & Plan Note (Signed)
She questions whether she may have a URI versus allergy symptoms with some increased cough.  I recommended loratadine for her to use and nasal congestion

## 2017-06-11 DIAGNOSIS — E039 Hypothyroidism, unspecified: Secondary | ICD-10-CM | POA: Diagnosis not present

## 2017-06-11 DIAGNOSIS — I10 Essential (primary) hypertension: Secondary | ICD-10-CM | POA: Diagnosis not present

## 2017-06-11 DIAGNOSIS — Z7984 Long term (current) use of oral hypoglycemic drugs: Secondary | ICD-10-CM | POA: Diagnosis not present

## 2017-06-11 DIAGNOSIS — E785 Hyperlipidemia, unspecified: Secondary | ICD-10-CM | POA: Diagnosis not present

## 2017-06-11 DIAGNOSIS — H409 Unspecified glaucoma: Secondary | ICD-10-CM | POA: Diagnosis not present

## 2017-06-11 DIAGNOSIS — J441 Chronic obstructive pulmonary disease with (acute) exacerbation: Secondary | ICD-10-CM | POA: Diagnosis not present

## 2017-06-11 DIAGNOSIS — E1151 Type 2 diabetes mellitus with diabetic peripheral angiopathy without gangrene: Secondary | ICD-10-CM | POA: Diagnosis not present

## 2017-06-13 ENCOUNTER — Ambulatory Visit (INDEPENDENT_AMBULATORY_CARE_PROVIDER_SITE_OTHER): Payer: PPO | Admitting: Emergency Medicine

## 2017-06-13 ENCOUNTER — Other Ambulatory Visit: Payer: Self-pay | Admitting: Emergency Medicine

## 2017-06-13 DIAGNOSIS — J432 Centrilobular emphysema: Secondary | ICD-10-CM

## 2017-06-13 LAB — PULMONARY FUNCTION TEST
DL/VA % pred: 210 %
DL/VA: 8.74 ml/min/mmHg/L
DLCO cor % pred: 91 %
DLCO cor: 16.61 ml/min/mmHg
DLCO unc % pred: 88 %
DLCO unc: 16.19 ml/min/mmHg
FEF 25-75 Post: 0.47 L/sec
FEF 25-75 Pre: 0.49 L/sec
FEF2575-%Change-Post: -4 %
FEF2575-%Pred-Post: 42 %
FEF2575-%Pred-Pre: 43 %
FEV1-%Change-Post: -1 %
FEV1-%Pred-Post: 71 %
FEV1-%Pred-Pre: 72 %
FEV1-Post: 0.89 L
FEV1-Pre: 0.91 L
FEV1FVC-%Change-Post: 5 %
FEV1FVC-%Pred-Pre: 85 %
FEV6-%Change-Post: -4 %
FEV6-%Pred-Post: 82 %
FEV6-%Pred-Pre: 86 %
FEV6-Post: 1.28 L
FEV6-Pre: 1.34 L
FEV6FVC-%Change-Post: 3 %
FEV6FVC-%Pred-Post: 103 %
FEV6FVC-%Pred-Pre: 100 %
FVC-%Change-Post: -7 %
FVC-%Pred-Post: 79 %
FVC-%Pred-Pre: 85 %
FVC-Post: 1.3 L
FVC-Pre: 1.4 L
Post FEV1/FVC ratio: 68 %
Post FEV6/FVC ratio: 98 %
Pre FEV1/FVC ratio: 65 %
Pre FEV6/FVC Ratio: 95 %
RV % pred: 105 %
RV: 2.22 L
TLC % pred: 83 %
TLC: 3.64 L

## 2017-06-13 NOTE — Progress Notes (Signed)
Patient completed full PFT today. 

## 2017-06-17 DIAGNOSIS — H2513 Age-related nuclear cataract, bilateral: Secondary | ICD-10-CM | POA: Diagnosis not present

## 2017-06-17 DIAGNOSIS — H401131 Primary open-angle glaucoma, bilateral, mild stage: Secondary | ICD-10-CM | POA: Diagnosis not present

## 2017-06-25 ENCOUNTER — Ambulatory Visit (HOSPITAL_BASED_OUTPATIENT_CLINIC_OR_DEPARTMENT_OTHER): Payer: PPO | Attending: Emergency Medicine | Admitting: Pulmonary Disease

## 2017-06-25 VITALS — Ht 61.5 in | Wt 179.0 lb

## 2017-06-25 DIAGNOSIS — I493 Ventricular premature depolarization: Secondary | ICD-10-CM | POA: Insufficient documentation

## 2017-06-25 DIAGNOSIS — Z79899 Other long term (current) drug therapy: Secondary | ICD-10-CM | POA: Insufficient documentation

## 2017-06-25 DIAGNOSIS — G4733 Obstructive sleep apnea (adult) (pediatric): Secondary | ICD-10-CM

## 2017-06-25 DIAGNOSIS — J449 Chronic obstructive pulmonary disease, unspecified: Secondary | ICD-10-CM | POA: Insufficient documentation

## 2017-07-03 DIAGNOSIS — G4733 Obstructive sleep apnea (adult) (pediatric): Secondary | ICD-10-CM | POA: Diagnosis not present

## 2017-07-03 DIAGNOSIS — J42 Unspecified chronic bronchitis: Secondary | ICD-10-CM | POA: Diagnosis not present

## 2017-07-03 NOTE — Procedures (Signed)
    Patient Name: Angel Tanner, Angel Tanner Date: 06/25/2017   Gender: Female  D.O.B: Jan 15, 1941  Age (years): 77  Referring Provider: Baltazar Apo  Height (inches): 62  Interpreting Physician: Chesley Mires MD, ABSM  Weight (lbs): 179  RPSGT: Carolin Coy  BMI: 33  MRN: [id number]  Neck Size: 14.50   CLINICAL INFORMATION  The patient is referred for a split night study with CPAP. He has a history of COPD and sleep apnea. He was previously on CPAP. He has persistent snoring, sleep disruption, apnea, and daytime sleepiness. Most recent polysomnogram dated 01/11/2009 revealed an AHI of 5/h and RDI of 25.9/h. MEDICATIONS  Medications self-administered by patient taken the night of the study : [place]  As per the AASM Manual for the Scoring of Sleep and Associated Events v2.3 (April 2016) with a hypopnea requiring 4% desaturations. The channels recorded and monitored were frontal, central and occipital EEG, electrooculogram (EOG), submentalis EMG (chin), nasal and oral airflow, thoracic and abdominal wall motion, anterior tibialis EMG, snore microphone, electrocardiogram, and pulse oximetry. Bi-level positive airway pressure (BiPAP) was initiated when the patient met split night criteria and was titrated according to treat sleep-disordered breathing. RESPIRATORY PARAMETERS  Diagnostic Total AHI (/hr): 14.3 RDI (/hr): 30.9 OA Index (/hr): 0.4 CA Index (/hr): 0.0  REM AHI (/hr): N/A NREM AHI (/hr): 14.3 Supine AHI (/hr): 23.7 Non-supine AHI (/hr): 30.55  Min O2 Sat (%): 86.0 Mean O2 (%): 90.2 Time below 88% (min): 18.7      Titration Optimal IPAP Pressure (cm): 9 Optimal EPAP Pressure (cm): 9 AHI at Optimal Pressure (/hr): 5.8 Min O2 at Optimal Pressure (%): 86.0  Sleep % at Optimal (%): 87 Supine % at Optimal (%): 47          SLEEP ARCHITECTURE  The study was initiated at 9:58:58 PM and terminated at 4:42:28 AM. The total recorded time was 403.5  minutes. EEG confirmed total sleep time was 321 minutes yielding a sleep efficiency of 79.6%%. Sleep onset after lights out was 11.6 minutes with a REM latency of 258.5 minutes. The patient spent 13.6%% of the night in stage N1 sleep, 80.4%% in stage N2 sleep, 0.0%% in stage N3 and 6.07% in REM. Wake after sleep onset (WASO) was 70.9 minutes. The Arousal Index was 34.2/hour. LEG MOVEMENT DATA  The total Periodic Limb Movements of Sleep (PLMS) were 0. The PLMS index was 0.0 . CARDIAC DATA  The 2 lead EKG demonstrated sinus rhythm. The mean heart rate was 100.0 beats per minute. Other EKG findings include: PVCs.  IMPRESSIONS  - Mild to moderate obstructive sleep apnea with an AHI of 14.3 and SpO2 low of 86%%. - He did well with CPAP 9 cm H2O. He developed central apneas with higher pressure settings. - He did not require supplemental oxygen during this study. DIAGNOSIS  - Obstructive Sleep Apnea (327.23 [G47.33 ICD-10]) RECOMMENDATIONS  - Trial of CPAP therapy on 9 cm H2O with a Medium Wide size Philips Respironics Full Face Mask Dreamwear mask and heated humidification. [Electronically signed] 07/03/2017 08:31 AM Chesley Mires MD, ABSM  Diplomate, American Board of Sleep Medicine  NPI: [id number]

## 2017-07-09 DIAGNOSIS — G4730 Sleep apnea, unspecified: Secondary | ICD-10-CM | POA: Diagnosis not present

## 2017-07-09 DIAGNOSIS — M8580 Other specified disorders of bone density and structure, unspecified site: Secondary | ICD-10-CM | POA: Diagnosis not present

## 2017-07-09 DIAGNOSIS — E7800 Pure hypercholesterolemia, unspecified: Secondary | ICD-10-CM | POA: Diagnosis not present

## 2017-07-29 DIAGNOSIS — R600 Localized edema: Secondary | ICD-10-CM | POA: Diagnosis not present

## 2017-07-30 DIAGNOSIS — E1151 Type 2 diabetes mellitus with diabetic peripheral angiopathy without gangrene: Secondary | ICD-10-CM | POA: Diagnosis not present

## 2017-07-30 DIAGNOSIS — E039 Hypothyroidism, unspecified: Secondary | ICD-10-CM | POA: Diagnosis not present

## 2017-07-30 DIAGNOSIS — J42 Unspecified chronic bronchitis: Secondary | ICD-10-CM | POA: Diagnosis not present

## 2017-07-30 DIAGNOSIS — I10 Essential (primary) hypertension: Secondary | ICD-10-CM | POA: Diagnosis not present

## 2017-07-30 DIAGNOSIS — E785 Hyperlipidemia, unspecified: Secondary | ICD-10-CM | POA: Diagnosis not present

## 2017-07-30 DIAGNOSIS — J449 Chronic obstructive pulmonary disease, unspecified: Secondary | ICD-10-CM | POA: Diagnosis not present

## 2017-07-30 DIAGNOSIS — E119 Type 2 diabetes mellitus without complications: Secondary | ICD-10-CM | POA: Diagnosis not present

## 2017-07-30 DIAGNOSIS — H409 Unspecified glaucoma: Secondary | ICD-10-CM | POA: Diagnosis not present

## 2017-08-21 ENCOUNTER — Encounter: Payer: Self-pay | Admitting: Primary Care

## 2017-08-21 DIAGNOSIS — I5189 Other ill-defined heart diseases: Secondary | ICD-10-CM | POA: Diagnosis not present

## 2017-08-21 DIAGNOSIS — R600 Localized edema: Secondary | ICD-10-CM | POA: Diagnosis not present

## 2017-08-25 ENCOUNTER — Other Ambulatory Visit: Payer: Self-pay | Admitting: *Deleted

## 2017-08-26 ENCOUNTER — Ambulatory Visit (HOSPITAL_COMMUNITY): Payer: PPO | Attending: Cardiovascular Disease

## 2017-08-26 ENCOUNTER — Other Ambulatory Visit: Payer: Self-pay

## 2017-08-26 ENCOUNTER — Other Ambulatory Visit (HOSPITAL_COMMUNITY): Payer: Self-pay | Admitting: Internal Medicine

## 2017-08-26 DIAGNOSIS — I10 Essential (primary) hypertension: Secondary | ICD-10-CM | POA: Insufficient documentation

## 2017-08-26 DIAGNOSIS — E785 Hyperlipidemia, unspecified: Secondary | ICD-10-CM | POA: Diagnosis not present

## 2017-08-26 DIAGNOSIS — I340 Nonrheumatic mitral (valve) insufficiency: Secondary | ICD-10-CM | POA: Diagnosis not present

## 2017-08-26 DIAGNOSIS — Z87891 Personal history of nicotine dependence: Secondary | ICD-10-CM | POA: Diagnosis not present

## 2017-08-26 DIAGNOSIS — R600 Localized edema: Secondary | ICD-10-CM | POA: Diagnosis not present

## 2017-08-26 DIAGNOSIS — J449 Chronic obstructive pulmonary disease, unspecified: Secondary | ICD-10-CM | POA: Insufficient documentation

## 2017-08-26 DIAGNOSIS — E119 Type 2 diabetes mellitus without complications: Secondary | ICD-10-CM | POA: Insufficient documentation

## 2017-08-26 DIAGNOSIS — I5189 Other ill-defined heart diseases: Secondary | ICD-10-CM | POA: Diagnosis not present

## 2017-08-26 DIAGNOSIS — I719 Aortic aneurysm of unspecified site, without rupture: Secondary | ICD-10-CM | POA: Insufficient documentation

## 2017-08-26 DIAGNOSIS — G4733 Obstructive sleep apnea (adult) (pediatric): Secondary | ICD-10-CM | POA: Insufficient documentation

## 2017-08-26 DIAGNOSIS — Z8249 Family history of ischemic heart disease and other diseases of the circulatory system: Secondary | ICD-10-CM | POA: Insufficient documentation

## 2017-08-26 DIAGNOSIS — R0600 Dyspnea, unspecified: Secondary | ICD-10-CM | POA: Diagnosis not present

## 2017-08-26 NOTE — Patient Outreach (Signed)
[facility] [facility]) Care Management  08/26/2017  RANEY ANTWINE 11-15-1940 [id number]   Telephone Screen  Referral Date: 08/25/17 Referral Source: Nurse call center  Referral Reason: caller states that she is having bilateral ankle and foot swelling Insurance: HTA  Outreach attempt #1 successful Patient is able to verify HIPAA Reviewed and addressed referral to [facility] with patient She confirms continued swelling of bilateral ankle and foot as reported to nurse line RN despite this being the fourth day of her treatmetnt  Plan of furosemide as ordered by her primary MD.  She is elevating her feet and monitoring sodium intake CM reviewed CHF s/s and she confirms dry hacking cough, being on lisinopril "for years" a wt gain of 3 lbs or more but resolved the next am, swelling of feet and feeling tired. She reports not monitoring her urine for changes but will being to monitor it  CM reviewed Lisinopril swelling side effects and encouraged her to continue the treatment plan ordered by her Primary MD. She reports the primary MD is on vacation.  Cm reminded her of the on call provider for her primary MD and encouraged her to weigh qd and to report s/s to her on call MD.  She confirmed she would.   She discussed CO2 elevation from her last labs she was able to view on the portal. She reports she is followed by her pulmonologist but has not been seen by an endocrinologist "in a while"  She confirms she does not have a cardiologist.  Social: Mrs Murray lives alone but is independent with ADLs and iADLs. She does  Have a son and sister  Conditions: HTN, COPD, OSA, GERD, acute pancreatitis, DM 2, Hyperlipidemia, tobacco use disorder, glaucoma, back pain   Medications: She states she has nine pills and denies concerns with taking medications as prescribed, affording medications, side effects of medications and questions about medications She states her primary MD, [name] request she stop  Lisinopril and start Furosemide for 3 days and to continue for 3 more days if her swelling did not resolve.  She plans on continuing the furosemide and having a ECHO on August 26 2017 am   Appointments: ECHO on August 27 2107   Advance Directives: She denies need for assistance at this time   Consent: [facility] RN CM reviewed [facility] services with patient. Patient denied need for [facility] services at this time but voices appreciation of the call from [facility] telephonic CM.   Plan: [facility] RN CM will send Mrs Piscopo EMMI education via e mail at the e mail address verified in Epic on swelling, compression and CHF as discussed  CM will follow up with Mrs Laviolette after her ECHO this week with in 3-4 business days for possible further concerns as discussed with her on this call and plan for case closure  Kimberly L. Lavina Hamman, RN, BSN, CCM [facility] Telephonic Care Management Care Coordinator Direct number [phone]  Main [facility] number [phone] Fax number [phone]

## 2017-08-28 ENCOUNTER — Other Ambulatory Visit: Payer: Self-pay | Admitting: *Deleted

## 2017-08-28 NOTE — Patient Outreach (Addendum)
[name] Care Management  08/28/2017  Angel Tanner [birth year] [id number]   Case closure/Care coordination  [facility] RN CM returned a call to [name] to follow up on her ECHO and to see if she had questions about the ECHO and the [facility] educational  information provided. She confirms no change in her edema treatment plan. Questions answered about ECHO. Discussed having her to further discuss Lisinopril with her MD. She reports she has some improvements with use of Furosemide this week.  She has noted that her lower extremity edema continues to go down by the am but returns during the day with activity.  She will be following up with her MD. She has reviewed her EMMI educational materials. She voices appreciation for [facility] RN Cm follow up and educational information.  She will contact [facility] RN CM prn for further assistance.   Plan case closure as discussed with her on today and she will follow up with [facility] RN CM prn   [name], RN, BSN, CCM [facility] Telephonic Care Management Care Coordinator Direct number [phone]  Main [facility] number [phone] Fax number [phone]

## 2017-09-09 DIAGNOSIS — I5189 Other ill-defined heart diseases: Secondary | ICD-10-CM | POA: Diagnosis not present

## 2017-09-09 DIAGNOSIS — M8588 Other specified disorders of bone density and structure, other site: Secondary | ICD-10-CM | POA: Diagnosis not present

## 2017-09-09 DIAGNOSIS — R600 Localized edema: Secondary | ICD-10-CM | POA: Diagnosis not present

## 2017-09-10 ENCOUNTER — Ambulatory Visit (INDEPENDENT_AMBULATORY_CARE_PROVIDER_SITE_OTHER): Payer: PPO | Admitting: Primary Care

## 2017-09-10 ENCOUNTER — Encounter: Payer: Self-pay | Admitting: Primary Care

## 2017-09-10 VITALS — BP 136/80 | HR 86 | Ht 60.0 in | Wt 187.0 lb

## 2017-09-10 DIAGNOSIS — G4733 Obstructive sleep apnea (adult) (pediatric): Secondary | ICD-10-CM

## 2017-09-10 DIAGNOSIS — J449 Chronic obstructive pulmonary disease, unspecified: Secondary | ICD-10-CM | POA: Diagnosis not present

## 2017-09-10 DIAGNOSIS — M7989 Other specified soft tissue disorders: Secondary | ICD-10-CM

## 2017-09-10 NOTE — Assessment & Plan Note (Addendum)
-   Split night sleep study 06/2017- BiPAP 22/18cm H20, oxygen not required - Needs new order for DME set up  - 1 month download - FU in 60-90 days

## 2017-09-10 NOTE — Assessment & Plan Note (Signed)
-   New; Reports bilateral ankle swelling - Saw PCP yesterday. BNP normal, EF 60-65%. PRN lasix - Reinforced leg elevation, compression stockings, low sodium diet, weight loss and increasing physical exercise.

## 2017-09-10 NOTE — Assessment & Plan Note (Signed)
-   Stopped smoking Feb 2019 - Symptoms well controlled, using rescue inhaler once month  - Reinforced albuterol (not spirivia) prn sob/wheeze  - No maintenance required at this time

## 2017-09-10 NOTE — Patient Instructions (Addendum)
Set up BiPAP with DME, 22/18cm H20, mask of choice.  Download in 1 month FU in 60-90 days with Dr. Lamonte Sakai or NP  Work on healthy eating and exercise (limit chips and salt!) Elevate legs during the day, consider compression stockings to help   Fu with Dr. Gretta Cool with [facility] to see when you need next CT re: aneurysm

## 2017-09-10 NOTE — Progress Notes (Signed)
_0  ID: Angel Tanner, female    DOB: Jul 04, 1940, 77 y.o.   MRN: [id number]  Chief Complaint  Patient presents with  . Follow-up    Review sleep study results.    Referring provider: Seward Carol, MD  HPI: 77 year old female, former smoker(quit in feb 2019). Hx HTN, OSA, COPD, allergic rhinitis, GERD, DM, pulm nodules   Patient of Angel Tanner, last seen on 06/05/17. Chief complaint of daytime sleepiness, unintentional naps and dyspnea on exertion. Plan for sleep study and PFTs. Continue with albuterol, no need for maintenance inhaler. Based on how often she uses rescue inhaler further management will be considered.  Tests: PFTs 06/2017- FVC 1.4 (85%) FEV1 0.91 (72%), RATIO 65 (85%), DLCO 16.19 (88%) PFTs 2015- FVC 1.62 (91%), FEV1 1.08 (79%), RATIO 67 (86%), DLCO 8.74 (48%)  09/10/2017 Patient presents today for routine follow-up/review sleep study. Split night study completed in april, patient requiring BiPAP at 22/18cm H2O. She has not been set up with a home machine yet. Brought in some miscellaneous supplies but unsure if she actually has the full appliance. Regardless, she has not been using any device at night. Will need to be set up through [place].   She feels well, no acute pulmonary complaints. Rarely requires her rescue inhaler, estimates only needing it once a month. She has both spirivia and albuterol, notes that she was confused as to which one she should be using. Per Angel Tanner note he had wanted her on albuterol only at this time.   Reports bilateral ankle swelling. This has been addressed/worked up with her PCP whom she most recently saw yesterday. BNP is normal. ECHO showed preserved LV function. Taking prn lasix with mild improvement. Reinforced leg elevation, compression stockings, low sodium diet, weight loss and increasing physical exercise.   No Known Allergies  Immunization History  Administered Date(s) Administered  . Influenza Split 12/01/2013    . Influenza Whole 01/05/2008, 12/21/2008, 12/07/2009  . Pneumococcal Polysaccharide-23 01/05/2008, 12/01/2013  . Zoster 07/07/2009    Past Medical History:  Diagnosis Date  . Asthma   . COPD (chronic obstructive pulmonary disease) ([place])   . Diabetes mellitus   . Emphysema of lung ([place])   . Hypercholesteremia   . Hypertension     Tobacco History: Social History   Tobacco Use  Smoking Status Former Smoker  . Packs/day: 0.50  . Years: 45.00  . Pack years: 22.50  . Types: Cigarettes  . Last attempt to quit: 04/05/2017  . Years since quitting: 0.4  Smokeless Tobacco Never Used   Counseling given: Not Answered   Outpatient Medications Prior to Visit  Medication Sig Dispense Refill  . Albuterol Sulfate (PROAIR RESPICLICK) 423 (90 Base) MCG/ACT AEPB Inhale 2 puffs into the lungs every 4 (four) hours as needed (wheezing, tightness, shortness of breath). 1 each 5  . amLODipine (NORVASC) 2.5 MG tablet Take 2.5 mg by mouth daily.    Marland Kitchen aspirin 81 MG chewable tablet Chew 81 mg by mouth daily.    Marland Kitchen atorvastatin (LIPITOR) 10 MG tablet Take 10 mg by mouth daily.    . furosemide (LASIX) 20 MG tablet Take 20 mg by mouth daily.     Marland Kitchen glucose blood test strip 1 each by Other route as needed. Use as instructed    . Latanoprost 0.005 % EMUL Apply 1 drop to eye daily.    Marland Kitchen lisinopril-hydrochlorothiazide (PRINZIDE,ZESTORETIC) 20-12.5 MG per tablet Take 1 tablet by mouth daily. 30 tablet 0  .  Olopatadine HCl (PATADAY) 0.2 % SOLN 1 drop as needed.    Marland Kitchen omeprazole (PRILOSEC) 40 MG capsule Take 40 mg by mouth daily as needed.     . pioglitazone-metformin (ACTOPLUS MET) 15-850 MG per tablet Take 1 tablet by mouth 2 (two) times daily with a meal. (Patient taking differently: Take 1 tablet by mouth 1 day or 1 dose. ) 60 tablet 0   No facility-administered medications prior to visit.     Review of Systems  Review of Systems  Constitutional: Positive for fatigue.  Respiratory: Positive for  wheezing. Negative for cough, chest tightness and shortness of breath.   Cardiovascular: Positive for leg swelling.    Physical Exam  BP 136/80 (BP Location: Left Arm, Cuff Size: Normal)   Pulse 86   Ht 5' (1.524 m)   Wt 187 lb (84.8 kg)   SpO2 96%   BMI 36.52 kg/m  Physical Exam  Constitutional: She is oriented to person, place, and time. She appears well-developed and well-nourished.  HENT:  Head: Normocephalic.  Nose: Nose normal.  Mouth/Throat: Uvula is midline and oropharynx is clear and moist.  Cardiovascular: Normal rate, regular rhythm, normal heart sounds and normal pulses.  Trace bil ankle edema, tender to touch. No deformity or ecchymosis.   Pulmonary/Chest: Effort normal and breath sounds normal.  Neurological: She is alert and oriented to person, place, and time.  Psychiatric: She has a normal mood and affect. Her speech is normal and behavior is normal.    Lab Results:  BNP No results found for: BNP  ProBNP No results found for: PROBNP  Imaging: No results found.   Assessment & Plan:   Obstructive sleep apnea - Split night sleep study 06/2017- BiPAP 22/18cm H20, oxygen not required - Needs new order for DME set up  - 1 month download - FU in 60-90 days  COPD (chronic obstruction pulmonary disease) - Stopped smoking Feb 2019 - Symptoms well controlled, using rescue inhaler once month  - Reinforced albuterol (not spirivia) prn sob/wheeze  - No maintenance required at this time   Leg swelling - New; Reports bilateral ankle swelling - Saw PCP yesterday. BNP normal, EF 60-65%. PRN lasix - Reinforced leg elevation, compression stockings, low sodium diet, weight loss and increasing physical exercise.     Angel Ehrich, NP 09/10/2017

## 2017-09-11 ENCOUNTER — Telehealth: Payer: Self-pay

## 2017-09-11 NOTE — Telephone Encounter (Signed)
Patient called concerned because she was told by her PCP that she needed to go see her "heart doctor" about a recent CT scan done that showed "a weak muscle in my heart".  She also saw her Pulmonologist to which they told her the same thing.  I advised her to contact her PCP, again and get a referral to see a Cardiologist since she does not have one.  She acknowledged receipt and was thankful for the information.

## 2017-09-16 DIAGNOSIS — R600 Localized edema: Secondary | ICD-10-CM | POA: Diagnosis not present

## 2017-09-22 DIAGNOSIS — G4733 Obstructive sleep apnea (adult) (pediatric): Secondary | ICD-10-CM | POA: Diagnosis not present

## 2017-09-25 ENCOUNTER — Ambulatory Visit: Payer: PPO | Admitting: Emergency Medicine

## 2017-10-01 DIAGNOSIS — H409 Unspecified glaucoma: Secondary | ICD-10-CM | POA: Diagnosis not present

## 2017-10-01 DIAGNOSIS — E1151 Type 2 diabetes mellitus with diabetic peripheral angiopathy without gangrene: Secondary | ICD-10-CM | POA: Diagnosis not present

## 2017-10-01 DIAGNOSIS — I10 Essential (primary) hypertension: Secondary | ICD-10-CM | POA: Diagnosis not present

## 2017-10-01 DIAGNOSIS — J441 Chronic obstructive pulmonary disease with (acute) exacerbation: Secondary | ICD-10-CM | POA: Diagnosis not present

## 2017-10-01 DIAGNOSIS — E039 Hypothyroidism, unspecified: Secondary | ICD-10-CM | POA: Diagnosis not present

## 2017-10-01 DIAGNOSIS — E785 Hyperlipidemia, unspecified: Secondary | ICD-10-CM | POA: Diagnosis not present

## 2017-10-01 DIAGNOSIS — Z7984 Long term (current) use of oral hypoglycemic drugs: Secondary | ICD-10-CM | POA: Diagnosis not present

## 2017-10-01 DIAGNOSIS — J42 Unspecified chronic bronchitis: Secondary | ICD-10-CM | POA: Diagnosis not present

## 2017-10-07 ENCOUNTER — Ambulatory Visit (INDEPENDENT_AMBULATORY_CARE_PROVIDER_SITE_OTHER)
Admission: RE | Admit: 2017-10-07 | Discharge: 2017-10-07 | Disposition: A | Discharge status: Home / Self Care | Payer: PPO | Source: Ambulatory Visit | Attending: Primary Care | Admitting: Primary Care

## 2017-10-07 ENCOUNTER — Other Ambulatory Visit (INDEPENDENT_AMBULATORY_CARE_PROVIDER_SITE_OTHER): Payer: PPO

## 2017-10-07 ENCOUNTER — Encounter: Payer: Self-pay | Admitting: Primary Care

## 2017-10-07 ENCOUNTER — Other Ambulatory Visit: Payer: Self-pay | Admitting: Primary Care

## 2017-10-07 ENCOUNTER — Telehealth: Payer: Self-pay | Admitting: Primary Care

## 2017-10-07 ENCOUNTER — Ambulatory Visit: Payer: PPO | Admitting: Emergency Medicine

## 2017-10-07 ENCOUNTER — Ambulatory Visit (INDEPENDENT_AMBULATORY_CARE_PROVIDER_SITE_OTHER): Payer: PPO | Admitting: Primary Care

## 2017-10-07 VITALS — BP 128/70 | HR 94 | Temp 98.8°F | Ht 61.0 in | Wt 190.0 lb

## 2017-10-07 DIAGNOSIS — M79605 Pain in left leg: Secondary | ICD-10-CM | POA: Diagnosis not present

## 2017-10-07 DIAGNOSIS — J9811 Atelectasis: Secondary | ICD-10-CM | POA: Diagnosis not present

## 2017-10-07 DIAGNOSIS — G4733 Obstructive sleep apnea (adult) (pediatric): Secondary | ICD-10-CM

## 2017-10-07 DIAGNOSIS — R042 Hemoptysis: Secondary | ICD-10-CM

## 2017-10-07 DIAGNOSIS — I712 Thoracic aortic aneurysm, without rupture, unspecified: Secondary | ICD-10-CM

## 2017-10-07 DIAGNOSIS — M79604 Pain in right leg: Secondary | ICD-10-CM

## 2017-10-07 DIAGNOSIS — R7989 Other specified abnormal findings of blood chemistry: Secondary | ICD-10-CM

## 2017-10-07 HISTORY — DX: Hemoptysis: R04.2

## 2017-10-07 LAB — PROTIME-INR
INR: 1 ratio (ref 0.8–1.0)
Prothrombin Time: 11.6 s (ref 9.6–13.1)

## 2017-10-07 LAB — CBC WITH DIFFERENTIAL/PLATELET
BASOS PCT: 1 % (ref 0.0–3.0)
Basophils Absolute: 0 10*3/uL (ref 0.0–0.1)
EOS PCT: 0.8 % (ref 0.0–5.0)
Eosinophils Absolute: 0 10*3/uL (ref 0.0–0.7)
HCT: 42.6 % (ref 36.0–46.0)
Hemoglobin: 13.6 g/dL (ref 12.0–15.0)
LYMPHS ABS: 1.2 10*3/uL (ref 0.7–4.0)
Lymphocytes Relative: 28.2 % (ref 12.0–46.0)
MCHC: 31.9 g/dL (ref 30.0–36.0)
MCV: 92.8 fl (ref 78.0–100.0)
MONOS PCT: 6.3 % (ref 3.0–12.0)
Monocytes Absolute: 0.3 10*3/uL (ref 0.1–1.0)
NEUTROS ABS: 2.8 10*3/uL (ref 1.4–7.7)
Neutrophils Relative %: 63.7 % (ref 43.0–77.0)
PLATELETS: 213 10*3/uL (ref 150.0–400.0)
RBC: 4.59 Mil/uL (ref 3.87–5.11)
RDW: 14 % (ref 11.5–15.5)
WBC: 4.3 10*3/uL (ref 4.0–10.5)

## 2017-10-07 LAB — D-DIMER, QUANTITATIVE: D-Dimer, Quant: 0.94 mcg/mL FEU — ABNORMAL HIGH (ref <0.50)

## 2017-10-07 NOTE — Assessment & Plan Note (Addendum)
-   New to BIPAP, used twice with new hemoptysis in the morning - She does have humidification - Pressure is set at 18 min EPAP/ 22 max IPAP - Will need 1-2 month follow-up once using regularly

## 2017-10-07 NOTE — Assessment & Plan Note (Addendum)
-   Persistent; continues lasix 20mg  daily  - Leg swelling worsens as the day goes one. Compression stockings did help when she used.  - Has PCP apt this week

## 2017-10-07 NOTE — Patient Instructions (Signed)
CXR today and labs Hold BIPAP until I call you with results today/tomorrow   FU in 1-2 weeks with Beth  Please check to make sure there is humidification with BiPAP - patient's DME is [facility]

## 2017-10-07 NOTE — Assessment & Plan Note (Signed)
-   New; complains of 2 episodes of hemoptysis in am after using BIPAP machine on 7/24 and 7/25 - Patient stopped BIPAP and has had no further episodes  - Checking CXR, cbc, d-dimer, QuantiFERON gold  - If CXR and labs normal would have patient re-try BIPAP, if hemoptysis persists would recommend decreasing BIPAP settings

## 2017-10-07 NOTE — Telephone Encounter (Signed)
Spoke with pt about lab results.  Transferred back to [place] to schedule CT Angiogram as ordered by [facility].  Nothing further needed.

## 2017-10-07 NOTE — Addendum Note (Signed)
Addended by: Martyn Ehrich on: 10/07/2017 02:39 PM   Modules accepted: Orders

## 2017-10-07 NOTE — Progress Notes (Signed)
$'@Patient'I$  ID: Angel Tanner, female    DOB: 1940-03-12, 77 y.o.   MRN: [id number]  Chief Complaint  Patient presents with  . Follow-up    cough with bloody mucous for 2 days in the AM    Referring provider: Seward Carol, MD  HPI: 77 year old female, former smoker(quit in feb 2019). Hx HTN, OSA, COPD, allergic rhinitis, GERD, DM, pulm nodules   Patient of Dr. Lamonte Sakai, last seen on 06/05/17. Chief complaint of daytime sleepiness, unintentional naps and dyspnea on exertion. Plan for sleep study and PFTs. Continue with albuterol, no need for maintenance inhaler. Based on how often she uses rescue inhaler further management will be considered.  Tests: PFTs 06/2017- FVC 1.4 (85%) FEV1 0.91 (72%), RATIO 65 (85%), DLCO 16.19 (88%) PFTs 2015- FVC 1.62 (91%), FEV1 1.08 (79%), RATIO 67 (86%), DLCO 8.74 (48%)  09/10/2017 Patient presents today for routine follow-up/review sleep study. Split night study completed in april, patient requiring BiPAP at 22/18cm H2O. She has not been set up with a home machine yet. Brought in some miscellaneous supplies but unsure if she actually has the full appliance. Regardless, she has not been using any device at night. Will need to be set up through [place].   She feels well, no acute pulmonary complaints. Rarely requires her rescue inhaler, estimates only needing it once a month. She has both spirivia and albuterol, notes that she was confused as to which one she should be using. Per Dr. Agustina Caroli note he had wanted her on albuterol only at this time.   Reports bilateral ankle swelling. This has been addressed/worked up with her PCP whom she most recently saw yesterday. BNP is normal. ECHO showed preserved LV function. Taking prn lasix with mild improvement. Reinforced leg elevation, compression stockings, low sodium diet, weight loss and increasing physical exercise.    10/07/2017 Patient presents today for an acute visit with complaints 2 days of hemoptysis in  the morning when using new BiPAP on 7/24 and 7/25.  Patient states that her machine has humidification, she puts water into it every night.  Patient tried to call DME company/and office and was unable to speak with anyone, she went to [place] for a trip and has not been using her BiPAP since.  She has had no further episodes of hemoptysis. Mild night sweats with mask use.  She does have a cough. Denies sob or chest pain.  Afebrile.  Takes low dose asa.   No Known Allergies  Immunization History  Administered Date(s) Administered  . Influenza Split 12/01/2013  . Influenza Whole 01/05/2008, 12/21/2008, 12/07/2009  . Pneumococcal Polysaccharide-23 01/05/2008, 12/01/2013  . Zoster 07/07/2009    Past Medical History:  Diagnosis Date  . Asthma   . COPD (chronic obstructive pulmonary disease) ([place])   . Diabetes mellitus   . Emphysema of lung ([place])   . Hypercholesteremia   . Hypertension     Tobacco History: Social History   Tobacco Use  Smoking Status Former Smoker  . Packs/day: 0.50  . Years: 45.00  . Pack years: 22.50  . Types: Cigarettes  . Last attempt to quit: 04/05/2017  . Years since quitting: 0.5  Smokeless Tobacco Never Used   Counseling given: Not Answered   Outpatient Medications Prior to Visit  Medication Sig Dispense Refill  . Albuterol Sulfate (PROAIR RESPICLICK) 616 (90 Base) MCG/ACT AEPB Inhale 2 puffs into the lungs every 4 (four) hours as needed (wheezing, tightness, shortness of breath). 1 each 5  .  aspirin 81 MG chewable tablet Chew 81 mg by mouth daily.    . furosemide (LASIX) 20 MG tablet Take 20 mg by mouth daily.     Marland Kitchen glucose blood test strip 1 each by Other route as needed. Use as instructed    . Latanoprost 0.005 % EMUL Apply 1 drop to eye daily.    . Olopatadine HCl (PATADAY) 0.2 % SOLN 1 drop as needed.    Marland Kitchen omeprazole (PRILOSEC) 40 MG capsule Take 40 mg by mouth daily as needed.     . pioglitazone-metformin (ACTOPLUS MET) 15-850 MG per tablet Take 1  tablet by mouth 2 (two) times daily with a meal. (Patient taking differently: Take 1 tablet by mouth 1 day or 1 dose. ) 60 tablet 0  . amLODipine (NORVASC) 2.5 MG tablet Take 2.5 mg by mouth daily.    Marland Kitchen atorvastatin (LIPITOR) 10 MG tablet Take 10 mg by mouth daily.    Marland Kitchen lisinopril-hydrochlorothiazide (PRINZIDE,ZESTORETIC) 20-12.5 MG per tablet Take 1 tablet by mouth daily. (Patient not taking: Reported on 10/07/2017) 30 tablet 0   No facility-administered medications prior to visit.       Review of Systems  Review of Systems  Constitutional: Negative.   HENT: Negative.   Respiratory: Positive for cough. Negative for chest tightness, shortness of breath and wheezing.   Cardiovascular: Negative.   Gastrointestinal: Negative.   Hematological: Negative.      Physical Exam  BP 128/70 (BP Location: Left Arm, Cuff Size: Normal)   Pulse (!) 104   Temp 98.8 F (37.1 C)   Ht 5' 1"  (1.549 m)   Wt 190 lb (86.2 kg)   SpO2 92%   BMI 35.90 kg/m  Physical Exam  Constitutional: She is oriented to person, place, and time. She appears well-developed and well-nourished.  HENT:  Head: Normocephalic and atraumatic.  Eyes: Pupils are equal, round, and reactive to light. EOM are normal.  Neck: Normal range of motion. Neck supple.  Cardiovascular: Normal rate, regular rhythm and normal heart sounds.  No murmur heard. Trace- +1 bilateral ankle swelling   Pulmonary/Chest: Effort normal and breath sounds normal. No respiratory distress. She has no wheezes.  Abdominal: Soft. Bowel sounds are normal. There is no tenderness.  Neurological: She is alert and oriented to person, place, and time.  Skin: Skin is warm and dry. No rash noted. No erythema.  Psychiatric: She has a normal mood and affect. Her behavior is normal. Judgment normal.  Vitals reviewed.    Lab Results:  CBC    Component Value Date/Time   WBC 4.4 (L) 07/21/2008 1200   RBC 4.38 07/21/2008 1200   HGB 13.6 07/21/2008 1200   HCT  39.5 07/21/2008 1200   PLT 175.0 07/21/2008 1200   MCV 90.3 07/21/2008 1200   MCHC 34.3 07/21/2008 1200   RDW 13.1 07/21/2008 1200   LYMPHSABS 1.5 07/21/2008 1200   MONOABS 0.1 07/21/2008 1200   EOSABS 0.1 07/21/2008 1200   BASOSABS 0.0 07/21/2008 1200    BMET    Component Value Date/Time   NA 139 08/07/2015 0822   K 3.8 08/07/2015 0822   CL 100 08/07/2015 0822   CO2 33 (H) 08/07/2015 0822   GLUCOSE 161 (H) 08/07/2015 0822   BUN 19 08/07/2015 0822   CREATININE 0.66 08/07/2015 0822   CALCIUM 9.5 08/07/2015 0822   GFRNONAA 127.74 07/21/2008 1200   GFRAA  05/30/2008 0625    >60        The eGFR has been  calculated using the MDRD equation. This calculation has not been validated in all clinical situations. eGFR's persistently <60 mL/min signify possible Chronic Kidney Disease.    BNP No results found for: BNP  ProBNP No results found for: PROBNP  Imaging: No results found.   Assessment & Plan:   Obstructive sleep apnea - New to BIPAP, used twice with new hemoptysis in the morning - She does have humidification - Pressure is set at 18 min EPAP/ 22 max IPAP - Will need 1-2 month follow-up once using regularly    Hemoptysis - New; complains of 2 episodes of hemoptysis in am after using BIPAP machine on 7/24 and 7/25 - Patient stopped BIPAP and has had no further episodes  - Checking CXR, cbc, d-dimer, QuantiFERON gold  - If CXR and labs normal would have patient re-try BIPAP, if hemoptysis persists would recommend decreasing BIPAP settings   LEG PAIN, BILATERAL - Persistent; continues lasix 56m daily  - Leg swelling worsens as the day goes one. Compression stockings did help when she used.  - Has PCP apt this week      EMartyn Ehrich NP 10/07/2017

## 2017-10-08 ENCOUNTER — Telehealth: Payer: Self-pay

## 2017-10-08 ENCOUNTER — Ambulatory Visit
Admission: RE | Admit: 2017-10-08 | Discharge: 2017-10-08 | Disposition: A | Discharge status: Home / Self Care | Payer: PPO | Source: Ambulatory Visit | Attending: Primary Care | Admitting: Primary Care

## 2017-10-08 DIAGNOSIS — R918 Other nonspecific abnormal finding of lung field: Secondary | ICD-10-CM | POA: Diagnosis not present

## 2017-10-08 DIAGNOSIS — R7989 Other specified abnormal findings of blood chemistry: Secondary | ICD-10-CM | POA: Insufficient documentation

## 2017-10-08 DIAGNOSIS — I700 Atherosclerosis of aorta: Secondary | ICD-10-CM | POA: Diagnosis not present

## 2017-10-08 DIAGNOSIS — I2510 Atherosclerotic heart disease of native coronary artery without angina pectoris: Secondary | ICD-10-CM | POA: Insufficient documentation

## 2017-10-08 DIAGNOSIS — I712 Thoracic aortic aneurysm, without rupture: Secondary | ICD-10-CM | POA: Diagnosis not present

## 2017-10-08 LAB — POCT I-STAT CREATININE: CREATININE: 0.6 mg/dL (ref 0.44–1.00)

## 2017-10-08 MED ORDER — IOPAMIDOL (ISOVUE-370) INJECTION 76%
75.0000 mL | Freq: Once | INTRAVENOUS | Status: AC | PRN
  Administered 2017-10-08: 75 mL via INTRAVENOUS

## 2017-10-08 NOTE — Telephone Encounter (Signed)
Left message on machine that scan was good and did not need to call back unless she has questions or needs anything.

## 2017-10-09 ENCOUNTER — Other Ambulatory Visit: Payer: Self-pay

## 2017-10-09 ENCOUNTER — Telehealth: Payer: Self-pay | Admitting: Primary Care

## 2017-10-09 DIAGNOSIS — I10 Essential (primary) hypertension: Secondary | ICD-10-CM | POA: Diagnosis not present

## 2017-10-09 DIAGNOSIS — R600 Localized edema: Secondary | ICD-10-CM | POA: Diagnosis not present

## 2017-10-09 DIAGNOSIS — E1151 Type 2 diabetes mellitus with diabetic peripheral angiopathy without gangrene: Secondary | ICD-10-CM | POA: Diagnosis not present

## 2017-10-09 DIAGNOSIS — Z7984 Long term (current) use of oral hypoglycemic drugs: Secondary | ICD-10-CM | POA: Diagnosis not present

## 2017-10-09 NOTE — Addendum Note (Signed)
Addended by: Karmen Stabs on: 10/09/2017 10:24 AM   Modules accepted: Orders

## 2017-10-09 NOTE — Telephone Encounter (Signed)
done

## 2017-10-09 NOTE — Telephone Encounter (Signed)
Please order CTA in 1 year for follow-up  thoracic aortic aneurysm.

## 2017-10-10 ENCOUNTER — Telehealth: Payer: Self-pay | Admitting: Primary Care

## 2017-10-10 NOTE — Telephone Encounter (Signed)
Called to see how patient was doing. Labs and CXR normal, CTA negative for PE. She can restart bipap. Any further episodes of hemoptysis?

## 2017-10-10 NOTE — Telephone Encounter (Signed)
Pt is calling back [phone]

## 2017-10-10 NOTE — Telephone Encounter (Signed)
Called and spoke with pt regarding BW recommendations Advised pt to begin bipap therapy again Pt verbalized understanding and had no further questions nor concerns Nothing further needed at this time.

## 2017-10-22 ENCOUNTER — Encounter: Payer: Self-pay | Admitting: Emergency Medicine

## 2017-10-23 DIAGNOSIS — G4733 Obstructive sleep apnea (adult) (pediatric): Secondary | ICD-10-CM | POA: Diagnosis not present

## 2017-10-24 DIAGNOSIS — G4733 Obstructive sleep apnea (adult) (pediatric): Secondary | ICD-10-CM | POA: Diagnosis not present

## 2017-11-11 ENCOUNTER — Ambulatory Visit: Payer: PPO | Admitting: Emergency Medicine

## 2017-11-11 ENCOUNTER — Encounter: Payer: Self-pay | Admitting: Emergency Medicine

## 2017-11-11 DIAGNOSIS — G4733 Obstructive sleep apnea (adult) (pediatric): Secondary | ICD-10-CM | POA: Diagnosis not present

## 2017-11-11 DIAGNOSIS — J301 Allergic rhinitis due to pollen: Secondary | ICD-10-CM | POA: Diagnosis not present

## 2017-11-11 DIAGNOSIS — R042 Hemoptysis: Secondary | ICD-10-CM

## 2017-11-11 DIAGNOSIS — J449 Chronic obstructive pulmonary disease, unspecified: Secondary | ICD-10-CM | POA: Diagnosis not present

## 2017-11-11 NOTE — Assessment & Plan Note (Signed)
She denies any active disease, minimal congestion.  With her daily morning mucus certainly must consider a contribution of rhinitis.  Consider re-addition of allergy therapy going forward.  I will not anything right now.

## 2017-11-11 NOTE — Assessment & Plan Note (Signed)
Small amount of blood mixed in with mucus in the morning, has continued to occur intermittently while she is on BiPAP.  Question whether this is related to the humidity, question some component of allergic rhinitis although she denies any significant symptoms.  If it persists after we have addressed the humidity issue then I will do an airway inspection.

## 2017-11-11 NOTE — Assessment & Plan Note (Signed)
Still has some exertional dyspnea, rare albuterol use.  I will defer a schedule bronchodilator for now.  We may need to start at some point in the future.

## 2017-11-11 NOTE — Addendum Note (Signed)
Addended by: Della Goo C on: 11/11/2017 09:31 AM   Modules accepted: Orders

## 2017-11-11 NOTE — Progress Notes (Signed)
Subjective:    Patient ID: Angel Tanner, female    DOB: 09-12-1940, 77 y.o.   MRN: [id number]  HPI 77  yo woman, smoker ( 23 pk-yrs),  30mm RLL pulmonary nodule.  The nodule was discovered originally on CT scan abd/pelvis for abdominal pain.    ROV 09/23/16 -- this follow-up visit for patient with a history of COPD. She also has pulmonary nodules that have been followed serial CT scans and have been stable. She has continued to smoke, currently smoking approximately . She is having leg fatigue. Also a bit more SOB with exertion - including chores around the house.  She has albuterol that she uses rarely, usually when she wheezes. she was given Spiriva but has only used prn. Discussed with her today the difference between prn and scheduled BD's. She has some cough, wheezes approx . She is smoking 1/2 pack a day. Discussed cessation w her.   ROV 06/05/17 --77 year old woman with a history of COPD and tobacco use. She quit smoking in February!!  She has an aortic aneurysm that has been followed with CT scans of the chest by Dr. Darcey Nora.  We have also been following pulmonary nodules on those scans.  Her most recent was 09/25/16 and these nodules are now considered to be benign, she should need follow-up scanning just for the nodules themselves. She is having periods of sleepiness, naps unintentionally even when reading or watching TV. She came off CPAP a few years ago due to difficulty tolerating. She uses ProAir prn, rarely. She does get some dyspnea when she is active. She is having nasal congestion and URI /  allergy sx over the ast few days, increased cough.   ROV 11/11/17 --patient with history of tobacco use, COPD, aortic aneurysm and pulmonary nodules that have been benign on serial CT scans, most prominent is a 7 mm right lower lobe nodule.  She has obstructive sleep apnea and is been recently started on BiPAP.  She developed some blood-tinged mucus after BiPAP was initiated in absence of any other  significant symptoms. She feels a globus sensation, often no mucous, but she does see it in the am still with some streaks of blood. No overt epistaxis. She is having some B parietal HA's. She has been wearing her BiPAP mask, has a lot of dryness even though she is using humidity. No sinus drainage or purulence. She hasn't noticed any improvement in sleepiness and fatigue.   A CT scan of her chest was performed 10/08/2017 which I reviewed.  This showed no evidence of pulmonary embolism and the pulmonary nodules were stable.  A QuantiFERON gold was ordered but is not resulted.  No evidence on her CT chest to support TB.  Her COPD has been managed with albuterol as needed.  She is not on scheduled bronchodilators.  She uses albuterol a few times a week.          Objective:   Physical Exam Vitals:   11/11/17 0909  BP: (!) 154/92  Pulse: 85  SpO2: 94%  Weight: 183 lb 6.4 oz (83.2 kg)  Height: 5\' 1"  (1.549 m)   Gen: Pleasant, obese woman, in no distress,  normal affect  ENT: No lesions,  mouth clear,  oropharynx clear, no postnasal drip  Neck: No JVD, no  stridor  Lungs: No use of accessory muscles, small volumes, no wheeze or crackles.   Cardiovascular: RRR, heart sounds normal, no murmur or gallops, no peripheral edema  Musculoskeletal: No deformities,  no cyanosis or clubbing  Neuro: alert, non focal  Skin: Warm, no lesions or rashes       Assessment & Plan:   Obstructive sleep apnea Does not appear to be benefiting yet from BiPAP.  Still has daytime sleepiness.  She is not napping.  She has a lot of dryness in the mornings, question whether this is contributing to her blood-tinged mucus each morning.  We will speak with advanced home care about increasing her humidity settings.  COPD (chronic obstructive pulmonary disease) ([place]) Still has some exertional dyspnea, rare albuterol use.  I will defer a schedule bronchodilator for now.  We may need to start at some point in the  future.  Allergic rhinitis She denies any active disease, minimal congestion.  With her daily morning mucus certainly must consider a contribution of rhinitis.  Consider re-addition of allergy therapy going forward.  I will not anything right now.  Hemoptysis Small amount of blood mixed in with mucus in the morning, has continued to occur intermittently while she is on BiPAP.  Question whether this is related to the humidity, question some component of allergic rhinitis although she denies any significant symptoms.  If it persists after we have addressed the humidity issue then I will do an airway inspection.    Baltazar Apo, MD, PhD 11/11/2017, 9:29 AM [facility] [phone] or if no answer [phone]

## 2017-11-11 NOTE — Assessment & Plan Note (Signed)
Does not appear to be benefiting yet from BiPAP.  Still has daytime sleepiness.  She is not napping.  She has a lot of dryness in the mornings, question whether this is contributing to her blood-tinged mucus each morning.  We will speak with advanced home care about increasing her humidity settings.

## 2017-11-11 NOTE — Patient Instructions (Signed)
Please continue to use her BiPAP every night. We will consult advanced home care to adjust up your humidity to hopefully improve your nose and mouth dryness, blood-tinged mucus. Keep your albuterol available to use 2 puffs if needed for shortness of breath. We will not start a scheduled inhaled medication at this time but we may need to do so in the future. If you continue to see blood then we will consider performing an airway inspection by bronchoscopy. Follow with Dr Lamonte Sakai in 1 month

## 2017-11-20 DIAGNOSIS — E039 Hypothyroidism, unspecified: Secondary | ICD-10-CM | POA: Diagnosis not present

## 2017-11-20 DIAGNOSIS — H409 Unspecified glaucoma: Secondary | ICD-10-CM | POA: Diagnosis not present

## 2017-11-20 DIAGNOSIS — E785 Hyperlipidemia, unspecified: Secondary | ICD-10-CM | POA: Diagnosis not present

## 2017-11-20 DIAGNOSIS — E1151 Type 2 diabetes mellitus with diabetic peripheral angiopathy without gangrene: Secondary | ICD-10-CM | POA: Diagnosis not present

## 2017-11-20 DIAGNOSIS — Z7984 Long term (current) use of oral hypoglycemic drugs: Secondary | ICD-10-CM | POA: Diagnosis not present

## 2017-11-20 DIAGNOSIS — J441 Chronic obstructive pulmonary disease with (acute) exacerbation: Secondary | ICD-10-CM | POA: Diagnosis not present

## 2017-11-20 DIAGNOSIS — I10 Essential (primary) hypertension: Secondary | ICD-10-CM | POA: Diagnosis not present

## 2017-11-21 DIAGNOSIS — R110 Nausea: Secondary | ICD-10-CM | POA: Diagnosis not present

## 2017-11-21 DIAGNOSIS — R600 Localized edema: Secondary | ICD-10-CM | POA: Diagnosis not present

## 2017-11-21 DIAGNOSIS — I10 Essential (primary) hypertension: Secondary | ICD-10-CM | POA: Diagnosis not present

## 2017-11-21 DIAGNOSIS — R05 Cough: Secondary | ICD-10-CM | POA: Diagnosis not present

## 2017-11-21 DIAGNOSIS — Z23 Encounter for immunization: Secondary | ICD-10-CM | POA: Diagnosis not present

## 2017-11-23 DIAGNOSIS — G4733 Obstructive sleep apnea (adult) (pediatric): Secondary | ICD-10-CM | POA: Diagnosis not present

## 2017-12-01 DIAGNOSIS — Z803 Family history of malignant neoplasm of breast: Secondary | ICD-10-CM | POA: Diagnosis not present

## 2017-12-01 DIAGNOSIS — Z1231 Encounter for screening mammogram for malignant neoplasm of breast: Secondary | ICD-10-CM | POA: Diagnosis not present

## 2017-12-04 DIAGNOSIS — K219 Gastro-esophageal reflux disease without esophagitis: Secondary | ICD-10-CM | POA: Diagnosis not present

## 2017-12-04 DIAGNOSIS — R112 Nausea with vomiting, unspecified: Secondary | ICD-10-CM | POA: Diagnosis not present

## 2017-12-04 DIAGNOSIS — R197 Diarrhea, unspecified: Secondary | ICD-10-CM | POA: Diagnosis not present

## 2017-12-05 DIAGNOSIS — I739 Peripheral vascular disease, unspecified: Secondary | ICD-10-CM | POA: Diagnosis not present

## 2017-12-05 DIAGNOSIS — Z0000 Encounter for general adult medical examination without abnormal findings: Secondary | ICD-10-CM | POA: Diagnosis not present

## 2017-12-05 DIAGNOSIS — I10 Essential (primary) hypertension: Secondary | ICD-10-CM | POA: Diagnosis not present

## 2017-12-05 DIAGNOSIS — J449 Chronic obstructive pulmonary disease, unspecified: Secondary | ICD-10-CM | POA: Diagnosis not present

## 2017-12-05 DIAGNOSIS — Z1389 Encounter for screening for other disorder: Secondary | ICD-10-CM | POA: Diagnosis not present

## 2017-12-05 DIAGNOSIS — H409 Unspecified glaucoma: Secondary | ICD-10-CM | POA: Diagnosis not present

## 2017-12-05 DIAGNOSIS — I712 Thoracic aortic aneurysm, without rupture: Secondary | ICD-10-CM | POA: Diagnosis not present

## 2017-12-05 DIAGNOSIS — I5189 Other ill-defined heart diseases: Secondary | ICD-10-CM | POA: Diagnosis not present

## 2017-12-05 DIAGNOSIS — E7800 Pure hypercholesterolemia, unspecified: Secondary | ICD-10-CM | POA: Diagnosis not present

## 2017-12-05 DIAGNOSIS — E1151 Type 2 diabetes mellitus with diabetic peripheral angiopathy without gangrene: Secondary | ICD-10-CM | POA: Diagnosis not present

## 2017-12-05 DIAGNOSIS — G4730 Sleep apnea, unspecified: Secondary | ICD-10-CM | POA: Diagnosis not present

## 2017-12-12 ENCOUNTER — Other Ambulatory Visit: Payer: Self-pay | Admitting: *Deleted

## 2017-12-12 NOTE — Patient Outreach (Signed)
[facility]) Care Management  12/12/2017  Angel Tanner [birth year] [id number]   Telephone Screen  Referral Date: 12/10/17 Referral Source: [facility] nurse line   Referral Reason: headaches, diarrhea and swollen ankles caller would like to know which doctor she should be seen by Referred to primary MD first then ask that MD for suggestion to possibly cardiology or neurology Insurance: HTA   [facility] RN CM spoke with this pt about similar concerns on 08/26/17 after she called the nurse line Referral Reason: caller states that she is having bilateral ankle and foot swelling. At that time her primary MD had her on lasix treatment.  Cm at that time had encouraged her to call and speak with he MD about possible side effects of lisinopril. CM had sent her EMMI education via e mail at the e mail address verified in Epic on swelling, compression and CHF as discussed. She had an ECHO on August 26 2017. CM followed up with her after the ECHO, answered questions and she had reported resolution to her concerns    Outreach attempt # 1 successful at her home number Patient is able to verify HIPAA Reviewed and addressed referral to [facility] with patient  Angel Tanner confirms she has not left a message at her primary care office She reports her headaches and diarrhea have resolved but she still has swelling of her ankles. She has worn compression sock that have made her feet feel better but not completely resolved the issue.  She denies use of sodium and states she is unable to tolerate salty foods She states both legs are without warmth or redness today but at intervals with sometimes discoloration and pain in the ankles when the feet have increased swelling. She reports the swelling as inconsistent. She confirms she is on lasix q am She is unsure of any Urine color changes. CM encouraged her to monitor for urine color changes and discussed the reason is to monitor for possible s/s of kidney concerns that  need to be reported to her MD She voiced understanding  CM discussed neuropathy she denies s/s other than discomfort in her arch only. She voiced understanding Cm discussed how to call the MD office to leave a message with the MD RN about her s/s and pending f/u from the MD. She voiced understanding and reported she would call the office today after speaking with CM  She confirms she is still on lisinopril and did not f/u with CM last suggestion to speak with her MD about possible side effects.  She was reminded to do so.      Social: Angel Tanner lives alone but is independent with ADLs and iADLs. She does  Have a son and sister  Conditions: HTN, COPD, OSA, GERD, acute pancreatitis, DM 2, Hyperlipidemia, tobacco use disorder, glaucoma, back pain   Medications:She states she has nine pills and denies concerns with taking medications as prescribed, affording medications, side effects of medications and questions about medications  Appointments: Reports she was seen by [name] primary MD on 12/05/17 but did not have s/s at that time  Advance Directives: She denies need for assistance at this time   Consent: [facility] RN CM reviewed [facility] services with patient. Patient gave verbal consent for services but denies need of services  Plan: [facility] RN CM will close case at this time as patient has been assessed, states her s/s are inconsistent, questions answered by CM and she was encouraged to follow up with her MD  as recommended bu nurse line   [facility] RN CM did further  education of CHF, neuropathy, kidney concerns on this call  [facility] RN CM encouraged her to f/u with her pcp as recommended so that he can get her with the correct specialist  Route note to MD   Sent MD an Epic in basket note   [place]. Lavina Hamman, RN, BSN, [place] Coordinator Office number [phone] Mobile number [phone]  Main [facility] number [phone] Fax number [phone]

## 2017-12-16 ENCOUNTER — Other Ambulatory Visit: Payer: Self-pay | Admitting: *Deleted

## 2017-12-16 NOTE — Patient Outreach (Signed)
[facility]) Care Management  12/16/2017  Angel Tanner September 10, 1940 [id number]   Telephone Screen  Referral Date: 12/16/17 Referral Source:Nurse call center  Referral Reason: caller stats that she is using dexilant and it is too expensive Is there something cheaper that can be prescribed for her  Insurance: HTA    Outreach attempt # 1 successful at her home number but it took some time for her to get to the phone to answer  Patient is able to verify HIPAA Reviewed and addressed referral to [facility] with patient She confirms that her concern voiced when calling the nurse call center was resolved by after she called and spoke with her GI provider staff   Social: Angel Tanner lives alone but is independent with ADLs and iADLs. She does Have a son and sister  Conditions: HTN, COPD, OSA, GERD, acute pancreatitis, DM 2, Hyperlipidemia, tobacco use disorder, glaucoma, back pain  Medications: She voiced concern with questions about purpose, difference from omeprazole and cost concern related to [place] the was given to her as a sample to try for 10 days by GI Dr, Michail Sermon. She reports that when she completed the 10 day samples and took her Rx to the pharmacy she was quoted a price of $90 for Dexilant and she is not able to afford this. CM discussed possible Dexilant drug patient assistance program that can be offered by the MD or [facility] pharmacist that may assist with decreasing the cost of the medication.  She decline assistance for Dexilant drug patient assistance program that can be offered by the MD or [facility] pharmacist  She states the concerns are resolved because she spoke to the GI office staff and will go back on omeprazole. She reports not being able to speak with [name] himself to ask about the difference in Dexilant vs omeprazole CM answered questions about Dexilant as compared to omeprazole related to action of medication, and cost.       Appointments:  Reports she  was seen by [name] primary MD on 12/05/17 and has called her GI MD office this week She states she did call [name] off and left a message related to swelling of her feet and treatment plan but is awaiting a return call She states she is aware that [name] is not available on Wednesday so she anticipates hearing from him possibly after Wednesday. CM reminded her that [facility] CM has sent CM's note and an in basket message to [name] about her voiced concerns discussed on 12/12/17 She voiced appreciation [name] pulmonologist seen on 11/11/17 and scheduled for her 1 month f/u on 12/17/17    Advance Directives: She denies need for assistance at this time  Consent: [facility] RN CM reviewed [facility] services with patient. Patient gave verbal consent for services.  Plan: [facility] RN CM will close case at this time as patient has been assessed, states her s/s are inconsistent, questions answered by CM and she was encouraged to follow up with her MD as recommended bu nurse line   [name], RN, BSN, [place] Coordinator Office number [phone] Mobile number [phone]  Main [facility] number [phone] Fax number [phone]

## 2017-12-17 ENCOUNTER — Ambulatory Visit (INDEPENDENT_AMBULATORY_CARE_PROVIDER_SITE_OTHER): Payer: PPO | Admitting: Emergency Medicine

## 2017-12-17 ENCOUNTER — Encounter: Payer: Self-pay | Admitting: Emergency Medicine

## 2017-12-17 DIAGNOSIS — R918 Other nonspecific abnormal finding of lung field: Secondary | ICD-10-CM

## 2017-12-17 DIAGNOSIS — R042 Hemoptysis: Secondary | ICD-10-CM | POA: Diagnosis not present

## 2017-12-17 DIAGNOSIS — J449 Chronic obstructive pulmonary disease, unspecified: Secondary | ICD-10-CM

## 2017-12-17 DIAGNOSIS — G4733 Obstructive sleep apnea (adult) (pediatric): Secondary | ICD-10-CM | POA: Diagnosis not present

## 2017-12-17 NOTE — Patient Instructions (Addendum)
We will hold off on restarting BiPAP for now.  Please keep track of your symptoms including a daytime sleepiness and energy level.  We will revisit BiPAP and consider restarting next time depending on how your symptoms are doing. Please keep your albuterol (ProAir air) available to use 2 puffs when you needed for shortness of breath, chest tightness, wheezing. Flu shot is up-to-date. Pneumonia shot is up-to-date. Follow with Dr Lamonte Sakai in 12 months or sooner if you have any problems

## 2017-12-17 NOTE — Assessment & Plan Note (Signed)
.    Benign on serial CT scans.  No further scans indicated at this time

## 2017-12-17 NOTE — Assessment & Plan Note (Signed)
Discussed her daily symptoms, currently using pro-air about once a day.  We will revisit next time, consider the pros and cons of starting scheduled bronchodilators.

## 2017-12-17 NOTE — Assessment & Plan Note (Signed)
Resolved with the addition of humidity to her BiPAP, no longer happening currently since she is off the BiPAP

## 2017-12-17 NOTE — Assessment & Plan Note (Signed)
She notes that she is tried to have good compliance with BiPAP but that every time she rolls over in bed it wakes her up.  She is decided to stop it.  We will stay off of it for now, consider restarting depending on symptoms going forward.

## 2017-12-17 NOTE — Progress Notes (Signed)
Subjective:    Patient ID: Angel Tanner, female    DOB: 1940/04/29, 77 y.o.   MRN: [id number]  HPI   ROV 11/11/17 --patient with history of tobacco use, COPD, aortic aneurysm and pulmonary nodules that have been benign on serial CT scans, most prominent is a 7 mm right lower lobe nodule.  She has obstructive sleep apnea and is been recently started on BiPAP.  She developed some blood-tinged mucus after BiPAP was initiated in absence of any other significant symptoms. She feels a globus sensation, often no mucous, but she does see it in the am still with some streaks of blood. No overt epistaxis. She is having some B parietal HA's. She has been wearing her BiPAP mask, has a lot of dryness even though she is using humidity. No sinus drainage or purulence. She hasn't noticed any improvement in sleepiness and fatigue.   A CT scan of her chest was performed 10/08/2017 which I reviewed.  This showed no evidence of pulmonary embolism and the pulmonary nodules were stable.  A QuantiFERON gold was ordered but is not resulted.  No evidence on her CT chest to support TB.  Her COPD has been managed with albuterol as needed.  She is not on scheduled bronchodilators.  She uses albuterol a few times a week.   ROV 12/17/17 --follow-up visit for 77 year old woman with COPD, history of aortic aneurysm and benign pulmonary nodules that were followed with serial CT scans.  She also has obstructive sleep apnea.  At our last visit we talked about the fact that she developed some blood-tinged mucus when she was on BiPAP.  I tried to increase her humidity settings to see if this would help - the dryness, bleeding resolved. Having a lot of trouble tolerating BiPAP, it wakes her up when she rolls over. she has given up on it.  Her sleepiness hasn't changed, energy is still low.  She is not currently on scheduled bronchodilators. She is no longer smoking. She uses proair about every day - seems to help her sleep. Occasional  daytime SOB with walking.          Objective:   Physical Exam Vitals:   12/17/17 1546  BP: 132/80  Pulse: 81  SpO2: 92%  Weight: 81.6 kg  Height: 5' 1.5" (1.562 m)   Gen: Pleasant, obese woman, in no distress,  normal affect  ENT: No lesions,  mouth clear,  oropharynx clear, no postnasal drip  Neck: No JVD, no  stridor  Lungs: No use of accessory muscles, small volumes, no wheeze or crackles.   Cardiovascular: RRR, heart sounds normal, no murmur or gallops, no peripheral edema  Musculoskeletal: No deformities, no cyanosis or clubbing  Neuro: alert, non focal  Skin: Warm, no lesions or rashes       Assessment & Plan:   Hemoptysis Resolved with the addition of humidity to her BiPAP, no longer happening currently since she is off the BiPAP  Pulmonary nodules .  Benign on serial CT scans.  No further scans indicated at this time  COPD (chronic obstructive pulmonary disease) ([place]) Discussed her daily symptoms, currently using pro-air about once a day.  We will revisit next time, consider the pros and cons of starting scheduled bronchodilators.  Obstructive sleep apnea She notes that she is tried to have good compliance with BiPAP but that every time she rolls over in bed it wakes her up.  She is decided to stop it.  We will stay off  of it for now, consider restarting depending on symptoms going forward.    Baltazar Apo, MD, PhD 12/17/2017, 4:10 PM [facility] [phone] or if no answer [phone]

## 2017-12-23 DIAGNOSIS — G4733 Obstructive sleep apnea (adult) (pediatric): Secondary | ICD-10-CM | POA: Diagnosis not present

## 2018-01-09 DIAGNOSIS — J189 Pneumonia, unspecified organism: Secondary | ICD-10-CM | POA: Diagnosis not present

## 2018-01-19 DIAGNOSIS — R51 Headache: Secondary | ICD-10-CM | POA: Diagnosis not present

## 2018-01-19 DIAGNOSIS — I10 Essential (primary) hypertension: Secondary | ICD-10-CM | POA: Diagnosis not present

## 2018-01-23 DIAGNOSIS — G4733 Obstructive sleep apnea (adult) (pediatric): Secondary | ICD-10-CM | POA: Diagnosis not present

## 2018-01-27 ENCOUNTER — Encounter: Payer: Self-pay | Admitting: Cardiovascular Disease

## 2018-01-27 ENCOUNTER — Ambulatory Visit (INDEPENDENT_AMBULATORY_CARE_PROVIDER_SITE_OTHER): Payer: PPO | Admitting: Cardiovascular Disease

## 2018-01-27 ENCOUNTER — Encounter

## 2018-01-27 VITALS — BP 146/92 | Ht 61.5 in | Wt 179.8 lb

## 2018-01-27 DIAGNOSIS — J432 Centrilobular emphysema: Secondary | ICD-10-CM | POA: Insufficient documentation

## 2018-01-27 DIAGNOSIS — R51 Headache: Secondary | ICD-10-CM | POA: Diagnosis not present

## 2018-01-27 DIAGNOSIS — I77810 Thoracic aortic ectasia: Secondary | ICD-10-CM

## 2018-01-27 DIAGNOSIS — F17200 Nicotine dependence, unspecified, uncomplicated: Secondary | ICD-10-CM

## 2018-01-27 DIAGNOSIS — G4733 Obstructive sleep apnea (adult) (pediatric): Secondary | ICD-10-CM

## 2018-01-27 DIAGNOSIS — E118 Type 2 diabetes mellitus with unspecified complications: Secondary | ICD-10-CM

## 2018-01-27 DIAGNOSIS — I700 Atherosclerosis of aorta: Secondary | ICD-10-CM

## 2018-01-27 HISTORY — DX: Centrilobular emphysema: J43.2

## 2018-01-27 HISTORY — DX: Thoracic aortic ectasia: I77.810

## 2018-01-27 HISTORY — DX: Atherosclerosis of aorta: I70.0

## 2018-01-27 NOTE — Patient Instructions (Addendum)
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
## 2018-02-02 DIAGNOSIS — I10 Essential (primary) hypertension: Secondary | ICD-10-CM | POA: Diagnosis not present

## 2018-02-02 DIAGNOSIS — R51 Headache: Secondary | ICD-10-CM | POA: Diagnosis not present

## 2018-02-03 ENCOUNTER — Encounter: Payer: Self-pay | Admitting: Neurology

## 2018-02-06 ENCOUNTER — Other Ambulatory Visit: Payer: Self-pay | Admitting: *Deleted

## 2018-02-06 DIAGNOSIS — I712 Thoracic aortic aneurysm, without rupture, unspecified: Secondary | ICD-10-CM

## 2018-02-22 DIAGNOSIS — G4733 Obstructive sleep apnea (adult) (pediatric): Secondary | ICD-10-CM | POA: Diagnosis not present

## 2018-03-05 ENCOUNTER — Other Ambulatory Visit: Payer: Self-pay | Admitting: Internal Medicine

## 2018-03-05 DIAGNOSIS — M79604 Pain in right leg: Secondary | ICD-10-CM | POA: Diagnosis not present

## 2018-03-05 DIAGNOSIS — M79605 Pain in left leg: Secondary | ICD-10-CM

## 2018-03-05 DIAGNOSIS — I8393 Asymptomatic varicose veins of bilateral lower extremities: Secondary | ICD-10-CM | POA: Diagnosis not present

## 2018-03-10 ENCOUNTER — Other Ambulatory Visit: Payer: PPO

## 2018-03-11 ENCOUNTER — Ambulatory Visit
Admission: RE | Admit: 2018-03-11 | Discharge: 2018-03-11 | Disposition: A | Discharge status: Home / Self Care | Payer: PPO | Source: Ambulatory Visit | Attending: Internal Medicine | Admitting: Internal Medicine

## 2018-03-11 DIAGNOSIS — M79661 Pain in right lower leg: Secondary | ICD-10-CM | POA: Diagnosis not present

## 2018-03-11 DIAGNOSIS — M79604 Pain in right leg: Secondary | ICD-10-CM

## 2018-03-11 DIAGNOSIS — M79662 Pain in left lower leg: Secondary | ICD-10-CM | POA: Diagnosis not present

## 2018-03-11 DIAGNOSIS — M79605 Pain in left leg: Secondary | ICD-10-CM

## 2018-03-16 IMAGING — US US PELVIS COMPLETE
1 series · 14 of 25 positions shown · non-contrast
Comparison: Abdominal and pelvic CT scan November 04, 2013

CLINICAL DATA: Generalized abdominal and pelvic pain with bloating

EXAM:
TRANSABDOMINAL AND TRANSVAGINAL ULTRASOUND OF PELVIS
TECHNIQUE: Both transabdominal and transvaginal ultrasound examinations of the
pelvis were performed. Transabdominal technique was performed for
global imaging of the pelvis including uterus, ovaries, adnexal
regions, and pelvic cul-de-sac. It was necessary to proceed with
endovaginal exam following the transabdominal exam to visualize the
endometrium and ovaries.

[Series 1: us pelvis complete · 0.33mm/px · 14 of 67 slices shown]
[im 1/67]
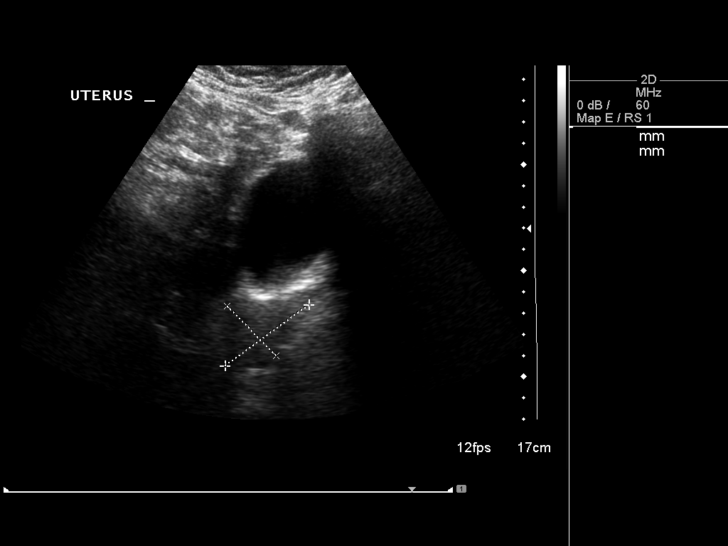
[im 6/67]
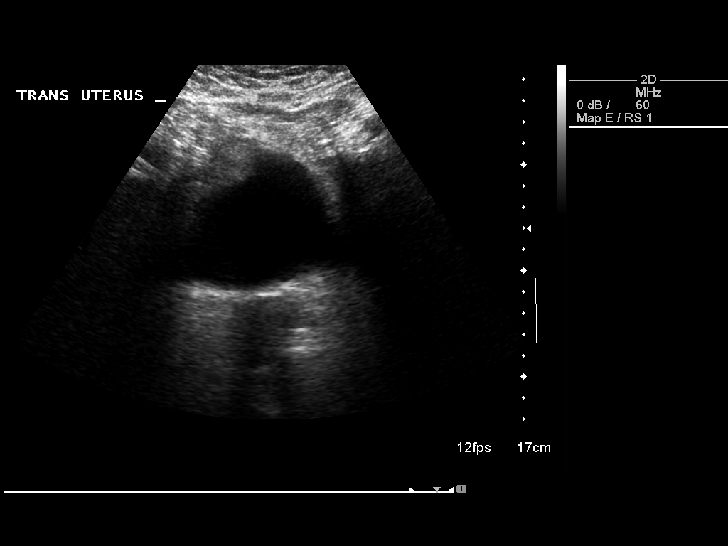
[im 12/67]
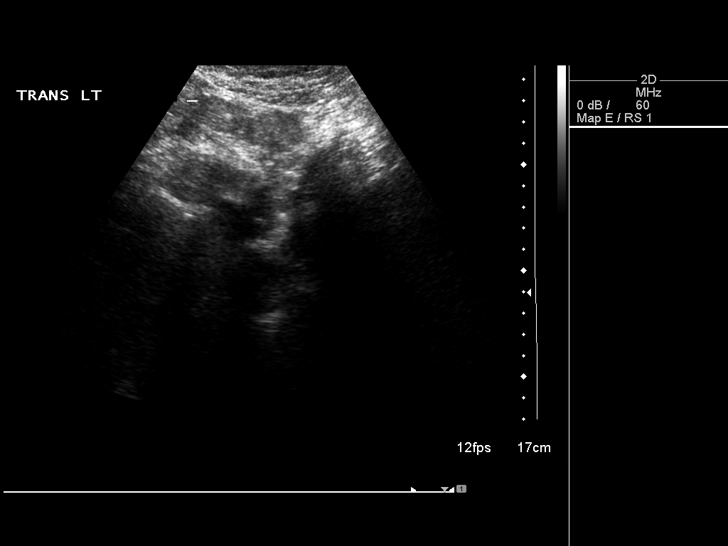
[im 17/67]
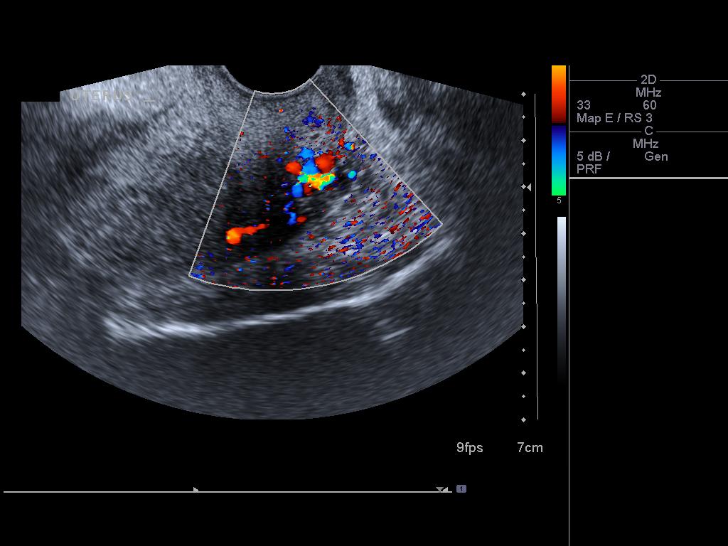
[im 23/67]
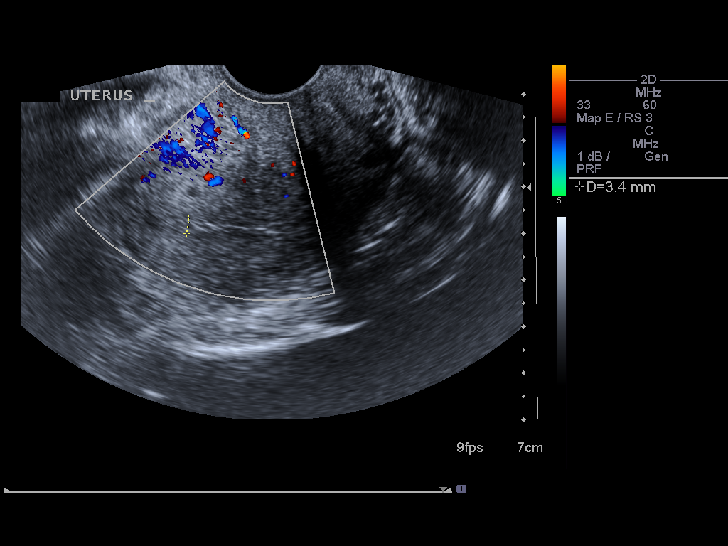
[im 25/67]
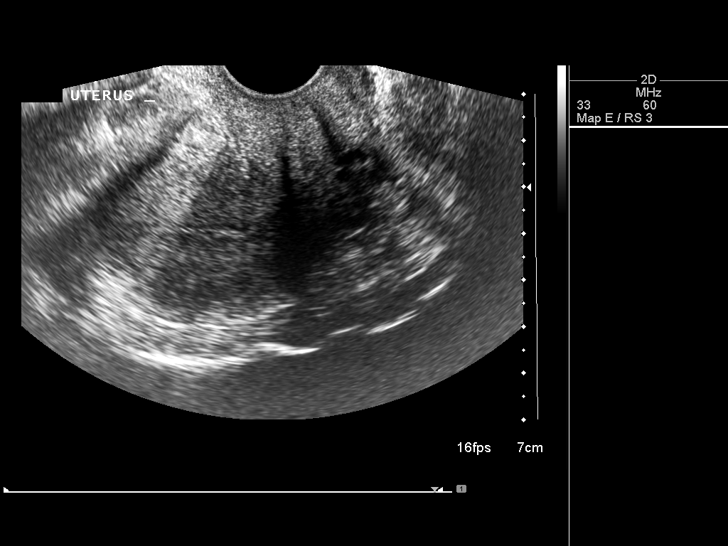
[im 31/67]
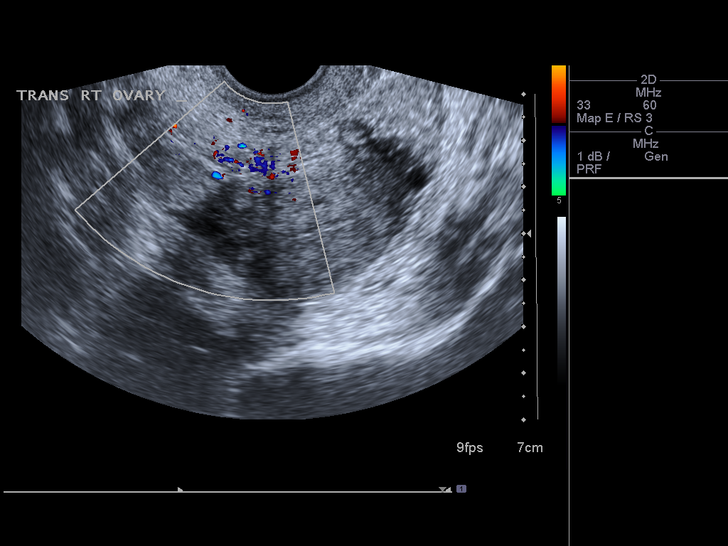
[im 36/67]
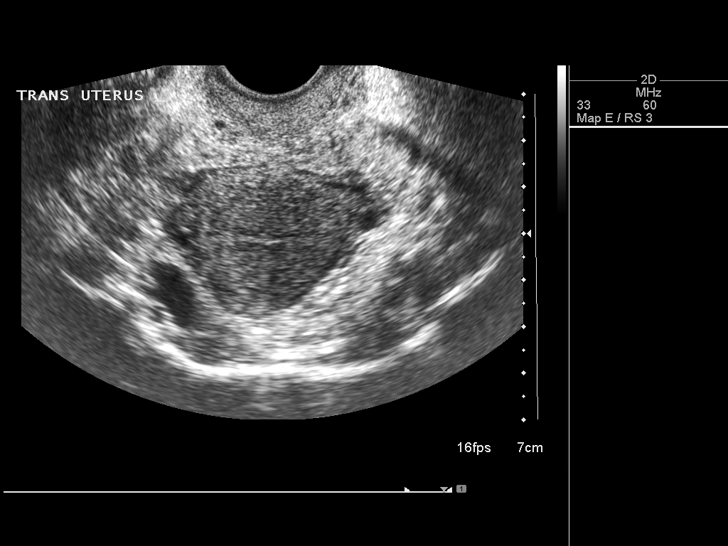
[im 42/67]
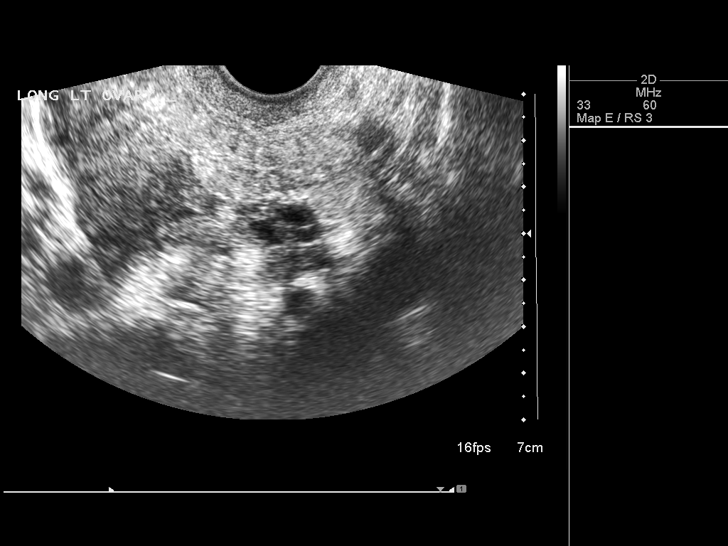
[im 45/67]
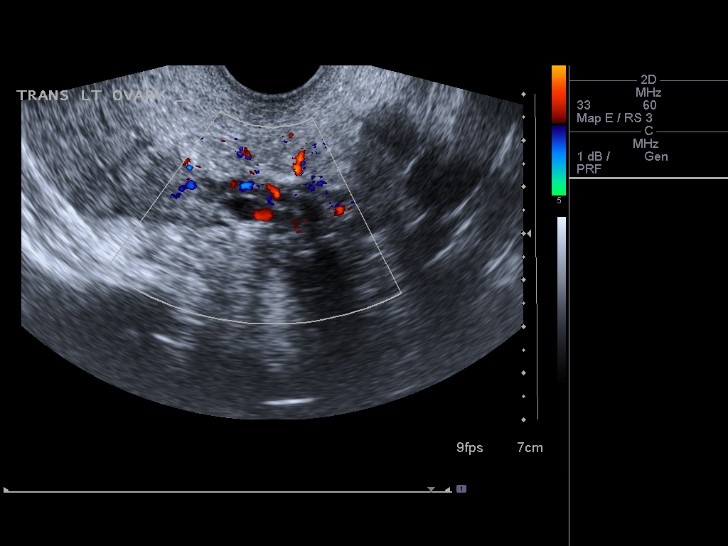
[im 50/67]
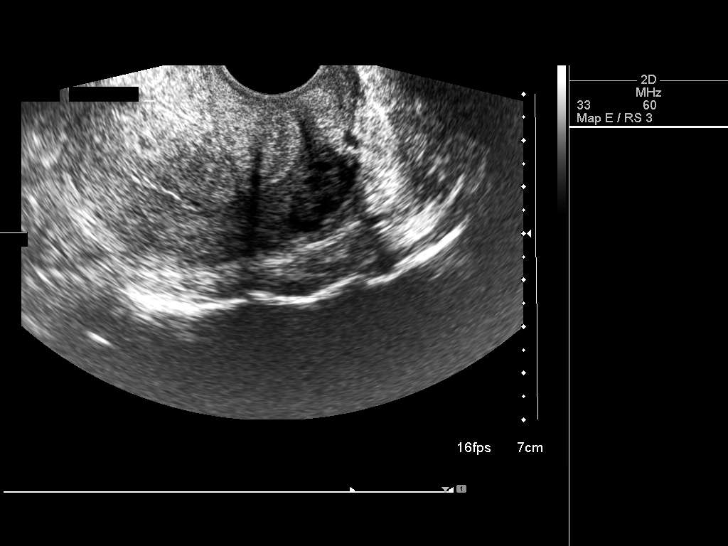
[im 56/67]
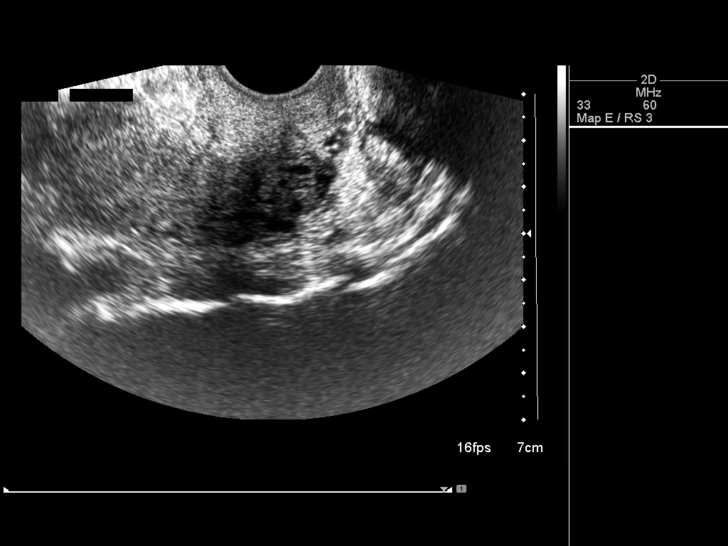
[im 61/67]
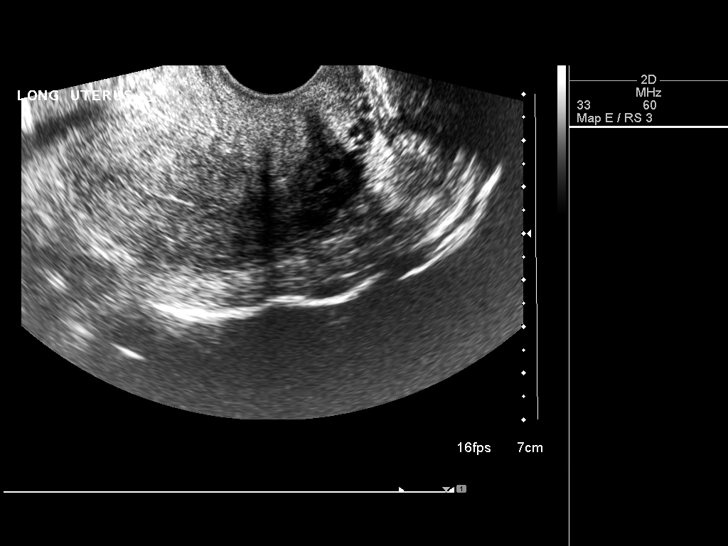
[im 67/67]
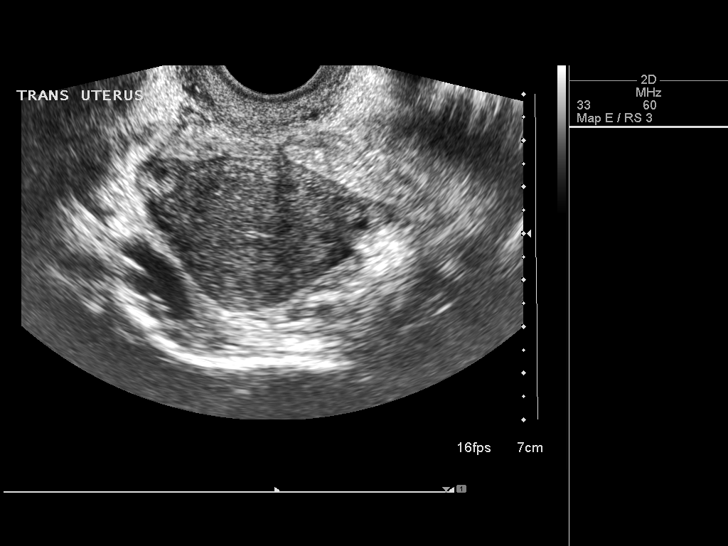

[14 of 25 positions shown; findings below may reference images not displayed]

FINDINGS: Uterus

Measurements: 4.9 x 3.3 x 3.9 cm. No fibroids or other mass
visualized.

Endometrium

Thickness: 3 mm.  No focal abnormality visualized.

Right ovary

Measurements: 2.2 x 1.5 x 1.3 cm. Normal appearance/no adnexal mass.

Left ovary

Measurements: 1.6 x 1.0 x 1.2 cm. Normal appearance/no adnexal mass.

Other findings

There is no free pelvic fluid.
IMPRESSION: Normal appearance of the uterus, endometrium, and ovaries. No
adnexal masses or free pelvic fluid.

## 2018-03-25 ENCOUNTER — Ambulatory Visit
Admission: RE | Admit: 2018-03-25 | Discharge: 2018-03-25 | Disposition: A | Discharge status: Home / Self Care | Payer: PPO | Source: Ambulatory Visit | Attending: Cardiothoracic Surgery | Admitting: Cardiothoracic Surgery

## 2018-03-25 ENCOUNTER — Ambulatory Visit: Payer: PPO | Admitting: Cardiothoracic Surgery

## 2018-03-25 ENCOUNTER — Other Ambulatory Visit: Payer: Self-pay

## 2018-03-25 ENCOUNTER — Encounter: Payer: Self-pay | Admitting: Cardiothoracic Surgery

## 2018-03-25 VITALS — BP 148/100 | HR 93 | Resp 18 | Ht 61.5 in | Wt 177.0 lb

## 2018-03-25 DIAGNOSIS — I712 Thoracic aortic aneurysm, without rupture, unspecified: Secondary | ICD-10-CM

## 2018-03-25 DIAGNOSIS — I10 Essential (primary) hypertension: Secondary | ICD-10-CM

## 2018-03-25 DIAGNOSIS — G4733 Obstructive sleep apnea (adult) (pediatric): Secondary | ICD-10-CM | POA: Diagnosis not present

## 2018-03-25 MED ORDER — IOPAMIDOL (ISOVUE-300) INJECTION 61%
75.0000 mL | Freq: Once | INTRAVENOUS | Status: AC | PRN
  Administered 2018-03-25: 75 mL via INTRAVENOUS
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
## 2018-04-02 ENCOUNTER — Encounter: Payer: Self-pay | Admitting: Neurology

## 2018-04-02 ENCOUNTER — Ambulatory Visit (INDEPENDENT_AMBULATORY_CARE_PROVIDER_SITE_OTHER): Payer: PPO | Admitting: Neurology

## 2018-04-02 VITALS — BP 134/90 | HR 85 | Ht 61.5 in | Wt 177.0 lb

## 2018-04-02 DIAGNOSIS — G43009 Migraine without aura, not intractable, without status migrainosus: Secondary | ICD-10-CM | POA: Diagnosis not present

## 2018-04-02 NOTE — Patient Instructions (Signed)
You probably had migraines.  If you start having headaches again, make a follow up appointment with me.

## 2018-04-07 ENCOUNTER — Other Ambulatory Visit: Payer: Self-pay

## 2018-04-07 NOTE — Patient Outreach (Signed)
[facility]) Care Management  04/07/2018  Angel Tanner 14-Jan-1941 [id number]  Nurse Call Line Referral Date: 04/06/18 Reason for Referral: hurting and swollen legs Nurse call line recommendation: see primary MD  Attempt #1  Telephone call to patient regarding nurse call line follow up. HIPAA verified with patient. Explained reason for call.  Patient states she is feeling about the same. She states she did not contact her primary MD to schedule and appointment. Patient states she is not able to talk further because she is headed to a funeral.  Patient request call back at another time.   PLAN: RNCM will attempt 2nd telephone call to patient within 4 business days.  RNCm will send outreach letter to attempt contact   Quinn Plowman RN,BSN,CCM [facility] Telephonic  [phone]

## 2018-04-09 DIAGNOSIS — I10 Essential (primary) hypertension: Secondary | ICD-10-CM | POA: Diagnosis not present

## 2018-04-09 DIAGNOSIS — M79605 Pain in left leg: Secondary | ICD-10-CM | POA: Diagnosis not present

## 2018-04-09 DIAGNOSIS — M79604 Pain in right leg: Secondary | ICD-10-CM | POA: Diagnosis not present

## 2018-04-09 DIAGNOSIS — E1151 Type 2 diabetes mellitus with diabetic peripheral angiopathy without gangrene: Secondary | ICD-10-CM | POA: Diagnosis not present

## 2018-04-10 ENCOUNTER — Encounter: Payer: Self-pay | Admitting: Emergency Medicine

## 2018-04-10 ENCOUNTER — Other Ambulatory Visit: Payer: Self-pay

## 2018-04-10 ENCOUNTER — Ambulatory Visit: Payer: PPO | Admitting: Adult Health

## 2018-04-10 DIAGNOSIS — R042 Hemoptysis: Secondary | ICD-10-CM | POA: Diagnosis not present

## 2018-04-10 DIAGNOSIS — R918 Other nonspecific abnormal finding of lung field: Secondary | ICD-10-CM | POA: Diagnosis not present

## 2018-04-10 NOTE — Assessment & Plan Note (Addendum)
Recurrent episodes of intermittent hemoptysis x6 months.  Patient does have a history of heavy smoking. We will need to evaluate further with bronchoscopy.  Went over procedure with patient in detail.  Potential complications discussed.  Patient is not on anticoagulation.  Patient will hold her baby aspirin until after her procedure. Place patient on cough control measures. Case was discussed in detail with Dr. Lamonte Sakai who will plan for bronchoscopy next week  Plan  Patient Instructions  We are going to set you up for a Bronchoscopy next week.  Discuss with Primary MD that Lisinopril may be aggravating your cough  Delsym 2 tsp Twice daily  As needed  Cough.  Begin Claritin 10mg  .At bedtime.  Saline nasal rinses As needed   Hold Aspirin for now , After Bronchoscopy will let you know when to restart.  Voice rest no singing until seen back .  Follow up with Dr. Lamonte Sakai  Or Jaspal Pultz NP in 4 weeks and As needed   Please contact office for sooner follow up if symptoms do not improve or worsen or seek emergency care

## 2018-04-10 NOTE — Progress Notes (Signed)
@Patient  ID: Angel Tanner, female    DOB: [birth year], 78 y.o.   MRN: [id number]  Chief Complaint  Patient presents with  . Acute Visit    Pt c/o sensation of something in her throat for the past several months. She states that when she clear her throat in the mornings, she produces some bright red sputum.      Referring provider: Seward Carol, MD  HPI: 78 year old female former smoker followed for COPD and benign pulmonary nodules (followed on serial CT chest) Obstructive sleep apnea on nocturnal BiPAP  TEST/EVENTS :  PFT 06/13/2017 FEV1 72%, ratio 65, FVC 85%, DLCO 88%  CT chest 03/25/2018 stable 4.1 cm ascending aortic aneurysm.  Stable pulmonary nodules distant with benign etiology   04/10/2018 Acute OV : Hemoptysis  Presents for an acute office visit.  She says over the last 4 weeks that she has been coughing up and clearing throat and noticing more blood-tinged mucus.  Seems to be small amounts of blood mixed into her phlegm.  Says she has had this problem several times in the past over the last 6 months.  Initially this started in August 2019.  Patient underwent a CT chest on October 08, 2017 that was negative for PE.  Showed stable lung nodules.  And a a sending ascending aortic aneurysm. Recently had a follow-up CT chest to follow her ascending aortic aneurysm.  Which was stable.  This showed stable lung nodules. Patient says she has a daily dry cough.  Is nonproductive.  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  Recently was thought that the BiPAP was drying out her nasal passages and was causing some bloody postnasal drainage.  This went away for a while.  But says it is been intermittently coming back and she is noticed it more over the last 4 weeks.  Patient says she is not been wearing her BiPAP for several months now.  Says she cannot tolerate and does not want her restarted. She is a previous heavy smoker quit February 2019. Denies any weight  loss.  Chest pain orthopnea calf pain. She is not on anticoagulation.  Does take a baby aspirin daily.   Allergies  Allergen Reactions  . Iodinated Diagnostic Agents     Nausea and vomiting even at slow injection rate     Immunization History  Administered Date(s) Administered  . Influenza Split 12/01/2013  . Influenza Whole 01/05/2008, 12/21/2008, 12/07/2009  . Pneumococcal Polysaccharide-23 01/05/2008, 12/01/2013  . Tdap 09/23/2017  . Zoster 07/07/2009    Past Medical History:  Diagnosis Date  . Asthma   . COPD (chronic obstructive pulmonary disease) ([place])   . Diabetes mellitus   . Emphysema of lung ([place])   . Hypercholesteremia   . Hypertension     Tobacco History: Social History   Tobacco Use  Smoking Status Former Smoker  . Packs/day: 0.50  . Years: 45.00  . Pack years: 22.50  . Types: Cigarettes  . Last attempt to quit: 04/05/2017  . Years since quitting: 1.0  Smokeless Tobacco Never Used   Counseling given: Not Answered   Outpatient Medications Prior to Visit  Medication Sig Dispense Refill  . Albuterol Sulfate (PROAIR RESPICLICK) 017 (90 Base) MCG/ACT AEPB Inhale 2 puffs into the lungs every 4 (four) hours as needed (wheezing, tightness, shortness of breath). 1 each 5  . aspirin 81 MG chewable tablet Chew 81 mg by mouth daily.    Marland Kitchen  glucose blood test strip 1 each by Other route as needed. Use as instructed    . latanoprost (XALATAN) 0.005 % ophthalmic solution Place 1 drop into both eyes at bedtime.    Marland Kitchen lisinopril (PRINIVIL,ZESTRIL) 20 MG tablet Take 20 mg by mouth daily.    . metFORMIN (GLUCOPHAGE) 850 MG tablet Take 850 mg by mouth 2 (two) times daily with a meal.    . omeprazole (PRILOSEC) 40 MG capsule Take 40 mg by mouth daily as needed.     . rosuvastatin (CRESTOR) 10 MG tablet Take 10 mg by mouth daily.    . furosemide (LASIX) 20 MG tablet Take 20 mg by mouth daily.     . Latanoprost 0.005 % EMUL Apply 1 drop to eye daily.    Marland Kitchen losartan (COZAAR)  50 MG tablet Take 50 mg by mouth daily.     No facility-administered medications prior to visit.      Review of Systems:   Constitutional:   No  weight loss, night sweats,  Fevers, chills,  +fatigue, or  lassitude.  HEENT:   No headaches,  Difficulty swallowing,  Tooth/dental problems, or  Sore throat,                No sneezing, itching, ear ache, nasal congestion, post nasal drip,   CV:  No chest pain,  Orthopnea, PND, swelling in lower extremities, anasarca, dizziness, palpitations, syncope.   GI  No heartburn, indigestion, abdominal pain, nausea, vomiting, diarrhea, change in bowel habits, loss of appetite, bloody stools.   Resp:    No chest wall deformity  Skin: no rash or lesions.  GU: no dysuria, change in color of urine, no urgency or frequency.  No flank pain, no hematuria   MS:  No joint pain or swelling.  No decreased range of motion.  No back pain.    Physical Exam  BP (!) 150/84 (BP Location: Left Arm, Cuff Size: Normal)   Pulse 94   Temp 98.1 F (36.7 C) (Oral)   Ht 5' 1.5" (1.562 m)   Wt 178 lb 12.8 oz (81.1 kg)   SpO2 95%   BMI 33.24 kg/m   GEN: A/Ox3; pleasant , NAD, elderly    HEENT:  [place]/AT,  EACs-clear, TMs-wnl, NOSE-clear, THROAT-clear, no lesions, no postnasal drip or exudate noted.   NECK:  Supple w/ fair ROM; no JVD; normal carotid impulses w/o bruits; no thyromegaly or nodules palpated; no lymphadenopathy.    RESP  Clear  P & A; w/o, wheezes/ rales/ or rhonchi. no accessory muscle use, no dullness to percussion  CARD:  RRR, no m/r/g, no peripheral edema, pulses intact, no cyanosis or clubbing.  GI:   Soft & nt; nml bowel sounds; no organomegaly or masses detected.   Musco: Warm bil, no deformities or joint swelling noted.   Neuro: alert, no focal deficits noted.    Skin: Warm, no lesions or rashes    Lab Results:  CBC  BNP No results found for: BNP  ProBNP No results found for: PROBNP  Imaging: Ct Chest W  Contrast  Result Date: 03/25/2018 CLINICAL DATA:  78 y/o F; follow-up thoracic aortic aneurysm. I-STAT creatinine 0.6. EXAM: CT CHEST WITH CONTRAST TECHNIQUE: Multidetector CT imaging of the chest was performed during intravenous contrast administration. CONTRAST:  42mL ISOVUE-300 IOPAMIDOL (ISOVUE-300) INJECTION 61% COMPARISON:  10/08/2017, 09/25/2016, 08/22/2015, 08/26/2014 CT chest FINDINGS: Cardiovascular: Normal heart size. No pericardial effusion. Severe coronary artery calcific atherosclerosis. Normal caliber main pulmonary artery. Stable 4.1  cm ascending aortic aneurysm. Mild aortic calcific atherosclerosis. Mediastinum/Nodes: No enlarged mediastinal, hilar, or axillary lymph nodes. Thyroid gland, trachea, and esophagus demonstrate no significant findings. Lungs/Pleura: Mild centrilobular emphysema of the lung apices. Stable 3 mm right upper lobe pulmonary nodule (series 8, image 54). Stable atelectasis and right middle lobe. Stable 8 x 7 mm right lower lobe pulmonary nodule (series 8, image 95). Upper Abdomen: No acute abnormality. Musculoskeletal: No fracture is seen. Mild discogenic degenerative changes of the thoracic spine. IMPRESSION: 1. Stable 4.1 cm ascending aortic aneurysm. Recommend annual imaging followup by CTA or MRA. This recommendation follows 2010 ACCF/AHA/AATS/ACR/ASA/SCA/SCAI/SIR/STS/SVM Guidelines for the Diagnosis and Management of Patients with Thoracic Aortic Disease. 2010; 121: L373-S287. 2. Severe coronary artery calcific atherosclerosis. 3. Aortic Atherosclerosis (ICD10-I70.0) and Emphysema (ICD10-J43.9). 4. Stable pulmonary nodules over several years compatible with benign etiology. Electronically Signed   By: Kristine Garbe M.D.   On: 03/25/2018 14:58      PFT Results Latest Ref Rng & Units 06/13/2017 04/28/2013  FVC-Pre L 1.40 1.62  FVC-Predicted Pre % 85 91  FVC-Post L 1.30 1.66  FVC-Predicted Post % 79 93  Pre FEV1/FVC % % 65 67  Post FEV1/FCV % % 68 70   FEV1-Pre L 0.91 1.08  FEV1-Predicted Pre % 72 79  FEV1-Post L 0.89 1.16  DLCO UNC% % 88 48  DLCO COR %Predicted % 210 92  TLC L 3.64 4.04  TLC % Predicted % 83 92  RV % Predicted % 105 110    No results found for: NITRICOXIDE      Assessment & Plan:   Pulmonary nodules Stable on recent CT chest January 2020  Hemoptysis Recurrent episodes of intermittent hemoptysis x6 months.  Patient does have a history of heavy smoking. We will need to evaluate further with bronchoscopy.  Went over procedure with patient in detail.  Potential complications discussed.  Patient is not on anticoagulation.  Patient will hold her baby aspirin until after her procedure. Place patient on cough control measures. Case was discussed in detail with Dr. Lamonte Sakai who will plan for bronchoscopy next week  Plan  Patient Instructions  We are going to set you up for a Bronchoscopy next week.  Discuss with Primary MD that Lisinopril may be aggravating your cough  Delsym 2 tsp Twice daily  As needed  Cough.  Begin Claritin 10mg  .At bedtime.  Saline nasal rinses As needed   Hold Aspirin for now , After Bronchoscopy will let you know when to restart.  Voice rest no singing until seen back .  Follow up with Dr. Lamonte Sakai  Or Mando Blatz NP in 4 weeks and As needed   Please contact office for sooner follow up if symptoms do not improve or worsen or seek emergency care          Rexene Edison, NP 04/10/2018

## 2018-04-10 NOTE — H&P (View-Only) (Signed)
@Patient  ID: Angel Tanner, female    DOB: 12-03-1940, 78 y.o.   MRN: [id number]  Chief Complaint  Patient presents with  . Acute Visit    Pt c/o sensation of something in her throat for the past several months. She states that when she clear her throat in the mornings, she produces some bright red sputum.      Referring provider: Seward Carol, MD  HPI: 78 year old female former smoker followed for COPD and benign pulmonary nodules (followed on serial CT chest) Obstructive sleep apnea on nocturnal BiPAP  TEST/EVENTS :  PFT 06/13/2017 FEV1 72%, ratio 65, FVC 85%, DLCO 88%  CT chest 03/25/2018 stable 4.1 cm ascending aortic aneurysm.  Stable pulmonary nodules distant with benign etiology   04/10/2018 Acute OV : Hemoptysis  Presents for an acute office visit.  She says over the last 4 weeks that she has been coughing up and clearing throat and noticing more blood-tinged mucus.  Seems to be small amounts of blood mixed into her phlegm.  Says she has had this problem several times in the past over the last 6 months.  Initially this started in August 2019.  Patient underwent a CT chest on October 08, 2017 that was negative for PE.  Showed stable lung nodules.  And a a sending ascending aortic aneurysm. Recently had a follow-up CT chest to follow her ascending aortic aneurysm.  Which was stable.  This showed stable lung nodules. Patient says she has a daily dry cough.  Is nonproductive.  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  Recently was thought that the BiPAP was drying out her nasal passages and was causing some bloody postnasal drainage.  This went away for a while.  But says it is been intermittently coming back and she is noticed it more over the last 4 weeks.  Patient says she is not been wearing her BiPAP for several months now.  Says she cannot tolerate and does not want her restarted. She is a previous heavy smoker quit February 2019. Denies any weight  loss.  Chest pain orthopnea calf pain. She is not on anticoagulation.  Does take a baby aspirin daily.   Allergies  Allergen Reactions  . Iodinated Diagnostic Agents     Nausea and vomiting even at slow injection rate     Immunization History  Administered Date(s) Administered  . Influenza Split 12/01/2013  . Influenza Whole 01/05/2008, 12/21/2008, 12/07/2009  . Pneumococcal Polysaccharide-23 01/05/2008, 12/01/2013  . Tdap 09/23/2017  . Zoster 07/07/2009    Past Medical History:  Diagnosis Date  . Asthma   . COPD (chronic obstructive pulmonary disease) ([place])   . Diabetes mellitus   . Emphysema of lung ([place])   . Hypercholesteremia   . Hypertension     Tobacco History: Social History   Tobacco Use  Smoking Status Former Smoker  . Packs/day: 0.50  . Years: 45.00  . Pack years: 22.50  . Types: Cigarettes  . Last attempt to quit: 04/05/2017  . Years since quitting: 1.0  Smokeless Tobacco Never Used   Counseling given: Not Answered   Outpatient Medications Prior to Visit  Medication Sig Dispense Refill  . Albuterol Sulfate (PROAIR RESPICLICK) 706 (90 Base) MCG/ACT AEPB Inhale 2 puffs into the lungs every 4 (four) hours as needed (wheezing, tightness, shortness of breath). 1 each 5  . aspirin 81 MG chewable tablet Chew 81 mg by mouth daily.    Marland Kitchen  glucose blood test strip 1 each by Other route as needed. Use as instructed    . latanoprost (XALATAN) 0.005 % ophthalmic solution Place 1 drop into both eyes at bedtime.    Marland Kitchen lisinopril (PRINIVIL,ZESTRIL) 20 MG tablet Take 20 mg by mouth daily.    . metFORMIN (GLUCOPHAGE) 850 MG tablet Take 850 mg by mouth 2 (two) times daily with a meal.    . omeprazole (PRILOSEC) 40 MG capsule Take 40 mg by mouth daily as needed.     . rosuvastatin (CRESTOR) 10 MG tablet Take 10 mg by mouth daily.    . furosemide (LASIX) 20 MG tablet Take 20 mg by mouth daily.     . Latanoprost 0.005 % EMUL Apply 1 drop to eye daily.    Marland Kitchen losartan (COZAAR)  50 MG tablet Take 50 mg by mouth daily.     No facility-administered medications prior to visit.      Review of Systems:   Constitutional:   No  weight loss, night sweats,  Fevers, chills,  +fatigue, or  lassitude.  HEENT:   No headaches,  Difficulty swallowing,  Tooth/dental problems, or  Sore throat,                No sneezing, itching, ear ache, nasal congestion, post nasal drip,   CV:  No chest pain,  Orthopnea, PND, swelling in lower extremities, anasarca, dizziness, palpitations, syncope.   GI  No heartburn, indigestion, abdominal pain, nausea, vomiting, diarrhea, change in bowel habits, loss of appetite, bloody stools.   Resp:    No chest wall deformity  Skin: no rash or lesions.  GU: no dysuria, change in color of urine, no urgency or frequency.  No flank pain, no hematuria   MS:  No joint pain or swelling.  No decreased range of motion.  No back pain.    Physical Exam  BP (!) 150/84 (BP Location: Left Arm, Cuff Size: Normal)   Pulse 94   Temp 98.1 F (36.7 C) (Oral)   Ht 5' 1.5" (1.562 m)   Wt 178 lb 12.8 oz (81.1 kg)   SpO2 95%   BMI 33.24 kg/m   GEN: A/Ox3; pleasant , NAD, elderly    HEENT:  [place]/AT,  EACs-clear, TMs-wnl, NOSE-clear, THROAT-clear, no lesions, no postnasal drip or exudate noted.   NECK:  Supple w/ fair ROM; no JVD; normal carotid impulses w/o bruits; no thyromegaly or nodules palpated; no lymphadenopathy.    RESP  Clear  P & A; w/o, wheezes/ rales/ or rhonchi. no accessory muscle use, no dullness to percussion  CARD:  RRR, no m/r/g, no peripheral edema, pulses intact, no cyanosis or clubbing.  GI:   Soft & nt; nml bowel sounds; no organomegaly or masses detected.   Musco: Warm bil, no deformities or joint swelling noted.   Neuro: alert, no focal deficits noted.    Skin: Warm, no lesions or rashes    Lab Results:  CBC  BNP No results found for: BNP  ProBNP No results found for: PROBNP  Imaging: Ct Chest W  Contrast  Result Date: 03/25/2018 CLINICAL DATA:  78 y/o F; follow-up thoracic aortic aneurysm. I-STAT creatinine 0.6. EXAM: CT CHEST WITH CONTRAST TECHNIQUE: Multidetector CT imaging of the chest was performed during intravenous contrast administration. CONTRAST:  44mL ISOVUE-300 IOPAMIDOL (ISOVUE-300) INJECTION 61% COMPARISON:  10/08/2017, 09/25/2016, 08/22/2015, 08/26/2014 CT chest FINDINGS: Cardiovascular: Normal heart size. No pericardial effusion. Severe coronary artery calcific atherosclerosis. Normal caliber main pulmonary artery. Stable 4.1  cm ascending aortic aneurysm. Mild aortic calcific atherosclerosis. Mediastinum/Nodes: No enlarged mediastinal, hilar, or axillary lymph nodes. Thyroid gland, trachea, and esophagus demonstrate no significant findings. Lungs/Pleura: Mild centrilobular emphysema of the lung apices. Stable 3 mm right upper lobe pulmonary nodule (series 8, image 54). Stable atelectasis and right middle lobe. Stable 8 x 7 mm right lower lobe pulmonary nodule (series 8, image 95). Upper Abdomen: No acute abnormality. Musculoskeletal: No fracture is seen. Mild discogenic degenerative changes of the thoracic spine. IMPRESSION: 1. Stable 4.1 cm ascending aortic aneurysm. Recommend annual imaging followup by CTA or MRA. This recommendation follows 2010 ACCF/AHA/AATS/ACR/ASA/SCA/SCAI/SIR/STS/SVM Guidelines for the Diagnosis and Management of Patients with Thoracic Aortic Disease. 2010; 121: E720-N470. 2. Severe coronary artery calcific atherosclerosis. 3. Aortic Atherosclerosis (ICD10-I70.0) and Emphysema (ICD10-J43.9). 4. Stable pulmonary nodules over several years compatible with benign etiology. Electronically Signed   By: Kristine Garbe M.D.   On: 03/25/2018 14:58      PFT Results Latest Ref Rng & Units 06/13/2017 04/28/2013  FVC-Pre L 1.40 1.62  FVC-Predicted Pre % 85 91  FVC-Post L 1.30 1.66  FVC-Predicted Post % 79 93  Pre FEV1/FVC % % 65 67  Post FEV1/FCV % % 68 70   FEV1-Pre L 0.91 1.08  FEV1-Predicted Pre % 72 79  FEV1-Post L 0.89 1.16  DLCO UNC% % 88 48  DLCO COR %Predicted % 210 92  TLC L 3.64 4.04  TLC % Predicted % 83 92  RV % Predicted % 105 110    No results found for: NITRICOXIDE      Assessment & Plan:   Pulmonary nodules Stable on recent CT chest January 2020  Hemoptysis Recurrent episodes of intermittent hemoptysis x6 months.  Patient does have a history of heavy smoking. We will need to evaluate further with bronchoscopy.  Went over procedure with patient in detail.  Potential complications discussed.  Patient is not on anticoagulation.  Patient will hold her baby aspirin until after her procedure. Place patient on cough control measures. Case was discussed in detail with Dr. Lamonte Sakai who will plan for bronchoscopy next week  Plan  Patient Instructions  We are going to set you up for a Bronchoscopy next week.  Discuss with Primary MD that Lisinopril may be aggravating your cough  Delsym 2 tsp Twice daily  As needed  Cough.  Begin Claritin 10mg  .At bedtime.  Saline nasal rinses As needed   Hold Aspirin for now , After Bronchoscopy will let you know when to restart.  Voice rest no singing until seen back .  Follow up with Dr. Lamonte Sakai  Or Maxi Rodas NP in 4 weeks and As needed   Please contact office for sooner follow up if symptoms do not improve or worsen or seek emergency care          Rexene Edison, NP 04/10/2018

## 2018-04-10 NOTE — Patient Instructions (Addendum)
We are going to set you up for a Bronchoscopy next week.  Discuss with Primary MD that Lisinopril may be aggravating your cough  Delsym 2 tsp Twice daily  As needed  Cough.  Begin Claritin 10mg  .At bedtime.  Saline nasal rinses As needed   Hold Aspirin for now , After Bronchoscopy will let you know when to restart.  Voice rest no singing until seen back .  Follow up with Dr. Lamonte Sakai  Or [place] NP in 4 weeks and As needed   Please contact office for sooner follow up if symptoms do not improve or worsen or seek emergency care

## 2018-04-10 NOTE — Patient Outreach (Signed)
[facility]) Care Management  04/10/2018  Angel Tanner 28-Jan-1941 [id number]  Nurse Call Line Referral Date: 04/06/18 Reason for Referral: hurting and swollen legs Nurse call line recommendation: see primary MD   Telephone call to patient regarding nurse call line referral. HIPAA verified with patient. Explained reason for call.  Patient states she saw her primary MD on yesterday. She states her doctor is unsure what the issue is concerning her legs.   Patient states her doctor prescribed her pain medication which she was unable to pick up on yesterday because it wasn't ready. Patient state she will pick up the medication today. Patient states she really doesn't want pain medication but she wants to find out what is causing her pain.   Patient reports her doctor has referred her to a cardiologist and she had a CT scan done for this issue. She states nothing was found regarding the cause of the leg pain.   RNCM advised patient to try the pain medication her primary MD prescribed.  Advised patient to follow back up with her primary MD for ongoing symptoms. Patient verbalized understanding RNCM advised patient to contact nurse call line as needed.   PLAN: RNCM will close patient due to being assessed and having no further needs.   Quinn Plowman RN,BSN,CCM [facility] Telephonic  [phone]

## 2018-04-10 NOTE — Assessment & Plan Note (Signed)
Stable on recent CT chest January 2020

## 2018-04-14 ENCOUNTER — Ambulatory Visit (HOSPITAL_COMMUNITY)
Admission: RE | Admit: 2018-04-14 | Discharge: 2018-04-14 | Disposition: A | Discharge status: Home / Self Care | Payer: PPO | Source: Ambulatory Visit | Attending: Emergency Medicine | Admitting: Emergency Medicine

## 2018-04-14 ENCOUNTER — Ambulatory Visit (HOSPITAL_COMMUNITY)
Admission: RE | Admit: 2018-04-14 | Discharge: 2018-04-14 | Disposition: A | Discharge status: Home / Self Care | Payer: PPO | Attending: Emergency Medicine | Admitting: Emergency Medicine

## 2018-04-14 ENCOUNTER — Encounter (HOSPITAL_COMMUNITY): Payer: Self-pay | Admitting: Emergency Medicine

## 2018-04-14 ENCOUNTER — Encounter (HOSPITAL_COMMUNITY)
Admission: RE | Disposition: A | Discharge status: Home / Self Care | Payer: Self-pay | Source: Home / Self Care | Attending: Emergency Medicine

## 2018-04-14 DIAGNOSIS — R848 Other abnormal findings in specimens from respiratory organs and thorax: Secondary | ICD-10-CM | POA: Diagnosis not present

## 2018-04-14 DIAGNOSIS — Z7982 Long term (current) use of aspirin: Secondary | ICD-10-CM | POA: Insufficient documentation

## 2018-04-14 DIAGNOSIS — E7800 Pure hypercholesterolemia, unspecified: Secondary | ICD-10-CM | POA: Diagnosis not present

## 2018-04-14 DIAGNOSIS — G4733 Obstructive sleep apnea (adult) (pediatric): Secondary | ICD-10-CM | POA: Diagnosis not present

## 2018-04-14 DIAGNOSIS — E119 Type 2 diabetes mellitus without complications: Secondary | ICD-10-CM | POA: Diagnosis not present

## 2018-04-14 DIAGNOSIS — Z87891 Personal history of nicotine dependence: Secondary | ICD-10-CM | POA: Insufficient documentation

## 2018-04-14 DIAGNOSIS — K219 Gastro-esophageal reflux disease without esophagitis: Secondary | ICD-10-CM | POA: Insufficient documentation

## 2018-04-14 DIAGNOSIS — K229 Disease of esophagus, unspecified: Secondary | ICD-10-CM | POA: Diagnosis not present

## 2018-04-14 DIAGNOSIS — R9389 Abnormal findings on diagnostic imaging of other specified body structures: Secondary | ICD-10-CM | POA: Insufficient documentation

## 2018-04-14 DIAGNOSIS — R042 Hemoptysis: Secondary | ICD-10-CM | POA: Diagnosis not present

## 2018-04-14 DIAGNOSIS — Z79899 Other long term (current) drug therapy: Secondary | ICD-10-CM | POA: Insufficient documentation

## 2018-04-14 DIAGNOSIS — J449 Chronic obstructive pulmonary disease, unspecified: Secondary | ICD-10-CM | POA: Diagnosis not present

## 2018-04-14 DIAGNOSIS — Z7984 Long term (current) use of oral hypoglycemic drugs: Secondary | ICD-10-CM | POA: Diagnosis not present

## 2018-04-14 DIAGNOSIS — R918 Other nonspecific abnormal finding of lung field: Secondary | ICD-10-CM | POA: Insufficient documentation

## 2018-04-14 DIAGNOSIS — I10 Essential (primary) hypertension: Secondary | ICD-10-CM | POA: Diagnosis not present

## 2018-04-14 DIAGNOSIS — J384 Edema of larynx: Secondary | ICD-10-CM | POA: Insufficient documentation

## 2018-04-14 DIAGNOSIS — R948 Abnormal results of function studies of other organs and systems: Secondary | ICD-10-CM | POA: Diagnosis not present

## 2018-04-14 HISTORY — PX: VIDEO BRONCHOSCOPY: SHX5072

## 2018-04-14 LAB — BODY FLUID CELL COUNT WITH DIFFERENTIAL
Lymphs, Fluid: 18 %
Monocyte-Macrophage-Serous Fluid: 77 % (ref 50–90)
NEUTROPHIL FLUID: 5 % (ref 0–25)
WBC FLUID: 158 uL (ref 0–1000)

## 2018-04-14 LAB — GLUCOSE, CAPILLARY: Glucose-Capillary: 104 mg/dL — ABNORMAL HIGH (ref 70–99)

## 2018-04-14 SURGERY — VIDEO BRONCHOSCOPY WITHOUT FLUORO
Anesthesia: Moderate Sedation | Laterality: Bilateral

## 2018-04-14 MED ORDER — SODIUM CHLORIDE 0.9 % IV SOLN
INTRAVENOUS | Status: DC
  Administered 2018-04-14: 08:00:00 via INTRAVENOUS

## 2018-04-14 MED ORDER — PHENYLEPHRINE HCL 0.25 % NA SOLN
NASAL | Status: DC | PRN
  Administered 2018-04-14: 2 via NASAL

## 2018-04-14 MED ORDER — FENTANYL CITRATE (PF) 100 MCG/2ML IJ SOLN
INTRAMUSCULAR | Status: AC
  Filled 2018-04-14: qty 4

## 2018-04-14 MED ORDER — LIDOCAINE HCL 1 % IJ SOLN
INTRAMUSCULAR | Status: DC | PRN
  Administered 2018-04-14: 6 mL via RESPIRATORY_TRACT

## 2018-04-14 MED ORDER — MIDAZOLAM HCL (PF) 10 MG/2ML IJ SOLN
INTRAMUSCULAR | Status: DC | PRN
  Administered 2018-04-14: 2 mg via INTRAVENOUS
  Administered 2018-04-14: 1 mg via INTRAVENOUS

## 2018-04-14 MED ORDER — FENTANYL CITRATE (PF) 100 MCG/2ML IJ SOLN
INTRAMUSCULAR | Status: DC | PRN
  Administered 2018-04-14: 50 ug via INTRAVENOUS
  Administered 2018-04-14: 25 ug via INTRAVENOUS

## 2018-04-14 MED ORDER — LIDOCAINE HCL URETHRAL/MUCOSAL 2 % EX GEL
CUTANEOUS | Status: DC | PRN
  Administered 2018-04-14: 1

## 2018-04-14 MED ORDER — MIDAZOLAM HCL (PF) 5 MG/ML IJ SOLN
INTRAMUSCULAR | Status: AC
  Filled 2018-04-14: qty 2

## 2018-04-14 MED ORDER — PHENYLEPHRINE HCL 0.25 % NA SOLN: 1.0000 | Freq: Four times a day (QID) | NASAL | Status: DC | PRN

## 2018-04-14 MED ORDER — LIDOCAINE HCL 2 % EX GEL: 1.0000 "application " | Freq: Once | CUTANEOUS | Status: DC

## 2018-04-14 NOTE — Discharge Instructions (Signed)
Flexible Bronchoscopy, Care After This sheet gives you information about how to care for yourself after your test. Your doctor may also give you more specific instructions. If you have problems or questions, contact your doctor. Follow these instructions at home: Eating and drinking  Do not eat or drink anything (not even water) for 2 hours after your test, or until your numbing medicine (local anesthetic) wears off.  When your numbness is gone and your cough and gag reflexes have come back, you may: ? Eat only soft foods. ? Slowly drink liquids.  The day after the test, go back to your normal diet. Driving  Do not drive for 24 hours if you were given a medicine to help you relax (sedative).  Do not drive or use heavy machinery while taking prescription pain medicine. General instructions   Take over-the-counter and prescription medicines only as told by your doctor.  Return to your normal activities as told. Ask what activities are safe for you.  Do not use any products that have nicotine or tobacco in them. This includes cigarettes and e-cigarettes. If you need help quitting, ask your doctor.  Keep all follow-up visits as told by your doctor. This is important. It is very important if you had a tissue sample (biopsy) taken. Get help right away if:  You have shortness of breath that gets worse.  You get light-headed.  You feel like you are going to pass out (faint).  You have chest pain.  You cough up: ? More than a little blood. ? More blood than before. Summary  Do not eat or drink anything (not even water) for 2 hours after your test, or until your numbing medicine wears off.  Do not use cigarettes. Do not use e-cigarettes.  Get help right away if you have chest pain. This information is not intended to replace advice given to you by your health care provider. Make sure you discuss any questions you have with your health care provider. Document Released: 12/16/2008  Document Revised: 03/08/2016 Document Reviewed: 03/08/2016 Elsevier Interactive Patient Education  2019 [employer].  Nothing to eat drink until   10:45    am today  04/14/2018  Any questions or concerns please call the  Office at [phone]

## 2018-04-14 NOTE — Op Note (Signed)
[facility] Cardiopulmonary Patient Name: Angel Tanner Procedure Date: 04/14/2018 MRN: [id number] Attending MD: Collene Gobble , MD Date of Birth: 1941/02/18 CSN: [id number] Age: 78 Admit Type: Outpatient Ethnicity: Not Hispanic or Latino Procedure:            Bronchoscopy Indications:          Hemoptysis, Abnormal CT scan of chest Providers:            Collene Gobble, MD, Andre Lefort RRT,RCP, Christin Fudge Referring MD:          Medicines:            Midazolam 3 mg mg IV, Fentanyl 75 mcg IV, Lidocaine 1%                        applied to cords 12 mL, Lidocaine 1% applied to the                        tracheobronchial tree 12 mL Complications:        No immediate complications Estimated Blood Loss: Estimated blood loss: none. Procedure:      Pre-Anesthesia Assessment:      - A History and Physical has been performed. Patient meds and allergies       have been reviewed. The risks and benefits of the procedure and the       sedation options and risks were discussed with the patient. All       questions were answered and informed consent was obtained. Patient       identification and proposed procedure were verified prior to the       procedure by the physician in the procedure room. Mental Status       Examination: alert and oriented. Airway Examination: normal       oropharyngeal airway. Respiratory Examination: clear to auscultation. CV       Examination: normal. ASA Grade Assessment: II - A patient with mild       systemic disease. After reviewing the risks and benefits, the patient       was deemed in satisfactory condition to undergo the procedure. The       anesthesia plan was to use moderate sedation / analgesia (conscious       sedation). Immediately prior to administration of medications, the       patient was re-assessed for adequacy to receive sedatives. The heart       rate, respiratory rate, oxygen saturations, blood  pressure, adequacy of       pulmonary ventilation, and response to care were monitored throughout       the procedure. The physical status of the patient was re-assessed after       the procedure.      After obtaining informed consent, the bronchoscope was passed under       direct vision. Throughout the procedure, the patient's blood pressure,       pulse, and oxygen saturations were monitored continuously. the BF-H190       ([id number]) Olympus Bronchoscope was introduced through the right nostril       and advanced to the tracheobronchial tree. The procedure was       accomplished without difficulty. The patient tolerated the procedure       well.  The total duration of the procedure was 20 minutes. Findings:      Nasal passageway: The nasal passageway is normal.      Nasopharynx/oropharynx: The adenoids are large and there is       cobblestoning of the posterior pharyngeal wall.      Larynx: Laryngeal edema was visualized, most prominently in the anterior       commissure, on the epiglottis, on the left false vocal cord, at the       glottis, in the posterior commissure, in the left pyriform sinus, in the       right pyriform sinus and in the subglottic space. The edema is partially       obstructing the airway. GERD findings were visualized including       arytenoid edema and intra-arytenoid mucosal thickening (posterior       laryngitis).      The trachea is of normal caliber. The carina is sharp. The       tracheobronchial tree was examined to at least the first subsegmental       level. Bronchial mucosa and anatomy are normal; there are no       endobronchial lesions, and no secretions.      Brushings of a lesion were obtained in the medial segment of the right       middle lobe with a cytology brush and sent for routine cytology. Two       samples were obtained.      Bronchoalveolar lavage was performed in the RUL apical segment (B1) of       the lung and sent for cell count,  bacterial culture, viral smears &       culture, and fungal & AFB analysis and cytology. 90 mL of fluid were       instilled. 20 mL were returned. The return was clear. There were no       mucoid plugs in the return fluid. Impression:      - Hemoptysis      - Abnormal CT scan of chest      - Adenoids are large and cobblestoning of the posterior pharyngeal wall.      - Laryngeal edema.      - Arytenoid edema and intra-arytenoid mucosal thickening suspected to be       secondary to gastroesophageal reflux disease (GERD) was found.      - The airway examination was normal.      - Brushings were obtained.      - Bronchoalveolar lavage was performed. Moderate Sedation:      Moderate (conscious) sedation was personally administered by the       endoscopist. The following parameters were monitored: oxygen saturation,       heart rate, blood pressure, respiratory rate, EKG, adequacy of pulmonary       ventilation, and response to care. Total physician intraservice time was       20 minutes. Recommendation:      - Await BAL, brushing, culture and cytology results. Procedure Code(s):      --- Professional ---      [phone], Bronchoscopy, rigid or flexible, including fluoroscopic guidance,       when performed; with bronchial alveolar lavage      [phone], Bronchoscopy, rigid or flexible, including fluoroscopic guidance,       when performed; with brushing or protected brushings      99152, Moderate sedation services provided by the same physician or  other qualified health care professional performing the diagnostic or       therapeutic service that the sedation supports, requiring the presence       of an independent trained observer to assist in the monitoring of the       patient's level of consciousness and physiological status; initial 15       minutes of intraservice time, patient age 31 years or older Diagnosis Code(s):      --- Professional ---      R04.2, Hemoptysis      R93.89,  Abnormal findings on diagnostic imaging of other specified body       structures      J38.7, Other diseases of larynx      J38.4, Edema of larynx      K22.9, Disease of esophagus, unspecified CPT copyright 2018 American Medical Association. All rights reserved. The codes documented in this report are preliminary and upon coder review may  be revised to meet current compliance requirements. Collene Gobble, MD Collene Gobble, MD 04/14/2018 8:57:03 AM Number of Addenda: 0 Scope In: 8:20:55 AM Scope Out: 8:33:17 AM

## 2018-04-14 NOTE — Progress Notes (Signed)
Video Bronchoscopy done Intervention Bronchial washing done Intervention Bronchial brushing done Procedurre tolerated well

## 2018-04-14 NOTE — Interval H&P Note (Signed)
PCCM Interval Note  Ms Gertsch has been experiencing continued red/pink in her mucous. Most notable in the am when she clears her throat. No epistaxis noted. No fever, purulent sputum.   Vitals:   04/14/18 0718  Resp: 15  Temp: 98 F (36.7 C)  TempSrc: Oral  SpO2: 96%   Lungs distant but clear. No blood in OP  Plan:  Inspection FOB to eval for bleeding source, obtain cx data.  All questions answered. No barriers to proceeding.   Baltazar Apo, MD, PhD 04/14/2018, 8:09 AM [facility] [phone] or if no answer [phone]

## 2018-04-15 ENCOUNTER — Encounter (HOSPITAL_COMMUNITY): Payer: Self-pay | Admitting: Emergency Medicine

## 2018-04-15 LAB — ACID FAST SMEAR (AFB): ACID FAST SMEAR - AFSCU2: NEGATIVE

## 2018-04-15 LAB — ACID FAST SMEAR (AFB, MYCOBACTERIA)

## 2018-04-17 LAB — CULTURE, BAL-QUANTITATIVE W GRAM STAIN

## 2018-04-17 LAB — CULTURE, BAL-QUANTITATIVE: CULTURE: NORMAL — AB

## 2018-04-24 ENCOUNTER — Encounter: Payer: Self-pay | Admitting: Emergency Medicine

## 2018-04-24 ENCOUNTER — Ambulatory Visit (INDEPENDENT_AMBULATORY_CARE_PROVIDER_SITE_OTHER): Payer: PPO | Admitting: Emergency Medicine

## 2018-04-24 DIAGNOSIS — J449 Chronic obstructive pulmonary disease, unspecified: Secondary | ICD-10-CM

## 2018-04-24 DIAGNOSIS — R042 Hemoptysis: Secondary | ICD-10-CM

## 2018-04-24 DIAGNOSIS — G4733 Obstructive sleep apnea (adult) (pediatric): Secondary | ICD-10-CM | POA: Diagnosis not present

## 2018-04-24 NOTE — Assessment & Plan Note (Signed)
Her bronchoscopy was reassuring although she did have significant posterior pharyngeal and periglottic edema and erythema.  No clear bleeding source noted.  At this point unclear to me where the blood is coming from as I did not see any evidence of active bleeding in the nose, posterior pharynx, airway tree.  She denies any hematemesis although she does have GERD.  I think we can follow for now.  It may be that the BiPAP was a contributor to dry mucous membranes and bleeding although she has been off of this for 2 months

## 2018-04-24 NOTE — Progress Notes (Signed)
Subjective:    Patient ID: Angel Tanner, female    DOB: May 29, 1940, 78 y.o.   MRN: [id number]  HPI   ROV 11/11/17 --patient with history of tobacco use, COPD, aortic aneurysm and pulmonary nodules that have been benign on serial CT scans, most prominent is a 7 mm right lower lobe nodule.  She has obstructive sleep apnea and is been recently started on BiPAP.  She developed some blood-tinged mucus after BiPAP was initiated in absence of any other significant symptoms. She feels a globus sensation, often no mucous, but she does see it in the am still with some streaks of blood. No overt epistaxis. She is having some B parietal HA's. She has been wearing her BiPAP mask, has a lot of dryness even though she is using humidity. No sinus drainage or purulence. She hasn't noticed any improvement in sleepiness and fatigue.   A CT scan of her chest was performed 10/08/2017 which I reviewed.  This showed no evidence of pulmonary embolism and the pulmonary nodules were stable.  A QuantiFERON gold was ordered but is not resulted.  No evidence on her CT chest to support TB.  Her COPD has been managed with albuterol as needed.  She is not on scheduled bronchodilators.  She uses albuterol a few times a week.   ROV 12/17/17 --follow-up visit for 78 year old woman with COPD, history of aortic aneurysm and benign pulmonary nodules that were followed with serial CT scans.  She also has obstructive sleep apnea.  At our last visit we talked about the fact that she developed some blood-tinged mucus when she was on BiPAP.  I tried to increase her humidity settings to see if this would help - the dryness, bleeding resolved. Having a lot of trouble tolerating BiPAP, it wakes her up when she rolls over. she has given up on it.  Her sleepiness hasn't changed, energy is still low.  She is not currently on scheduled bronchodilators. She is no longer smoking. She uses proair about every day - seems to help her sleep. Occasional  daytime SOB with walking.    R OV 04/24/2018 --[facility] follows up today.  She is 78, has a history of COPD, benign pulmonary nodular disease that we followed previously with CT scans of the chest, obstructive sleep apnea on BiPAP.  She is intermittently had blood-tinged mucus, improved at least initially with increasing the humidity in her BiPAP.  It recurred earlier this month.  I performed bronchoscopy on 04/14/2018 that showed significant posterior pharyngeal and periglottic edema but no clear bleeding source.  There was some hypopigmented right middle lobe airway mucosa so that was brushed and was benign.  Bacterial culture shows normal flora, AFB and fungal smears negative, cultures pending.  She has daily mild GERD sx on omeprazole prn, on loratadine qd. Minimal cough, remains on lisinopril. She stopped her BiPAP about 2 months ago due to discomfort, waking her up. She is planning to go to [facility] to see if there are other mask options.       Objective:   Physical Exam Vitals:   04/24/18 1437  BP: 136/82  Pulse: (!) 105  SpO2: 96%  Weight: 177 lb 3.2 oz (80.4 kg)  Height: 5' 1.5" (1.562 m)   Gen: Pleasant, obese woman, in no distress,  normal affect  ENT: No lesions,  mouth clear,  oropharynx clear, no postnasal drip  Neck: No JVD, no  stridor  Lungs: No use of accessory muscles, small volumes,  no wheeze or crackles.   Cardiovascular: RRR, heart sounds normal, no murmur or gallops, no peripheral edema  Musculoskeletal: No deformities, no cyanosis or clubbing  Neuro: alert, non focal  Skin: Warm, no lesions or rashes       Assessment & Plan:   COPD (chronic obstructive pulmonary disease) (HCC) Not currently on bronchodilator therapy.  Obstructive sleep apnea She stopped her BiPAP about 2 months ago due to discomfort, difficulty sleeping through the night with it on.  Question whether there is a mask fit issue.  She is planning to go to [facility] to see if there is a superior mass  that she could try.  I have encouraged her to do this as I think she will likely benefit overall from continuing  Hemoptysis Her bronchoscopy was reassuring although she did have significant posterior pharyngeal and periglottic edema and erythema.  No clear bleeding source noted.  At this point unclear to me where the blood is coming from as I did not see any evidence of active bleeding in the nose, posterior pharynx, airway tree.  She denies any hematemesis although she does have GERD.  I think we can follow for now.  It may be that the BiPAP was a contributor to dry mucous membranes and bleeding although she has been off of this for 2 months    Baltazar Apo, MD, PhD 04/24/2018, 3:09 PM [facility] [phone] or if no answer [phone]

## 2018-04-24 NOTE — Assessment & Plan Note (Signed)
She stopped her BiPAP about 2 months ago due to discomfort, difficulty sleeping through the night with it on.  Question whether there is a mask fit issue.  She is planning to go to [facility] to see if there is a superior mass that she could try.  I have encouraged her to do this as I think she will likely benefit overall from continuing

## 2018-04-24 NOTE — Assessment & Plan Note (Signed)
Not currently on bronchodilator therapy.

## 2018-04-24 NOTE — Patient Instructions (Addendum)
Your bronchoscopy results and culture results are all negative so far.  We will continue to follow these until they are final. Continue your loratadine 10 mg once daily (Claritin) Agree with omeprazole 40 mg once daily. Agree with going to [facility] to review your options for new mask for your BiPAP.  I think you would benefit from trying to get back on it. Please call our office if you experience any increase in the blood you have seen in your mucus, blood from your nose. Follow with Dr Lamonte Sakai in 3 months or sooner if you have any problems.

## 2018-04-25 DIAGNOSIS — G4733 Obstructive sleep apnea (adult) (pediatric): Secondary | ICD-10-CM | POA: Diagnosis not present
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
## 2018-04-27 ENCOUNTER — Ambulatory Visit: Payer: PPO | Admitting: Endocrinology

## 2018-05-04 ENCOUNTER — Other Ambulatory Visit: Payer: Self-pay

## 2018-05-04 ENCOUNTER — Encounter: Payer: Self-pay | Admitting: Endocrinology

## 2018-05-04 ENCOUNTER — Ambulatory Visit (INDEPENDENT_AMBULATORY_CARE_PROVIDER_SITE_OTHER): Payer: PPO | Admitting: Endocrinology

## 2018-05-04 VITALS — BP 122/70 | HR 78 | Ht 61.5 in | Wt 181.6 lb

## 2018-05-04 DIAGNOSIS — E1165 Type 2 diabetes mellitus with hyperglycemia: Secondary | ICD-10-CM | POA: Diagnosis not present

## 2018-05-04 DIAGNOSIS — E7800 Pure hypercholesterolemia, unspecified: Secondary | ICD-10-CM

## 2018-05-04 DIAGNOSIS — M25561 Pain in right knee: Secondary | ICD-10-CM | POA: Diagnosis not present

## 2018-05-04 DIAGNOSIS — M25562 Pain in left knee: Secondary | ICD-10-CM | POA: Diagnosis not present

## 2018-05-04 DIAGNOSIS — I10 Essential (primary) hypertension: Secondary | ICD-10-CM | POA: Diagnosis not present

## 2018-05-04 LAB — MICROALBUMIN / CREATININE URINE RATIO
Creatinine,U: 65.2 mg/dL
Microalb Creat Ratio: 5.5 mg/g (ref 0.0–30.0)
Microalb, Ur: 3.6 mg/dL — ABNORMAL HIGH (ref 0.0–1.9)

## 2018-05-04 LAB — POCT GLYCOSYLATED HEMOGLOBIN (HGB A1C): Hemoglobin A1C: 7.1 % — AB (ref 4.0–5.6)

## 2018-05-04 LAB — URINALYSIS, ROUTINE W REFLEX MICROSCOPIC
Bilirubin Urine: NEGATIVE
Hgb urine dipstick: NEGATIVE
Ketones, ur: NEGATIVE
Leukocytes,Ua: NEGATIVE
Nitrite: NEGATIVE
RBC / HPF: NONE SEEN (ref 0–?)
Specific Gravity, Urine: 1.02 (ref 1.000–1.030)
Total Protein, Urine: NEGATIVE
Urine Glucose: NEGATIVE
Urobilinogen, UA: 0.2 (ref 0.0–1.0)
pH: 6 (ref 5.0–8.0)
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
## 2018-05-15 DIAGNOSIS — H40023 Open angle with borderline findings, high risk, bilateral: Secondary | ICD-10-CM | POA: Diagnosis not present

## 2018-05-15 DIAGNOSIS — H5203 Hypermetropia, bilateral: Secondary | ICD-10-CM | POA: Diagnosis not present

## 2018-05-15 DIAGNOSIS — H2513 Age-related nuclear cataract, bilateral: Secondary | ICD-10-CM | POA: Diagnosis not present

## 2018-05-15 LAB — FUNGUS CULTURE WITH STAIN

## 2018-05-15 LAB — FUNGUS CULTURE RESULT

## 2018-05-15 LAB — FUNGAL ORGANISM REFLEX

## 2018-05-22 ENCOUNTER — Ambulatory Visit: Payer: PPO | Admitting: Podiatry

## 2018-05-26 ENCOUNTER — Other Ambulatory Visit: Payer: Self-pay

## 2018-05-26 ENCOUNTER — Ambulatory Visit (INDEPENDENT_AMBULATORY_CARE_PROVIDER_SITE_OTHER): Payer: PPO | Admitting: Podiatry

## 2018-05-26 ENCOUNTER — Ambulatory Visit (INDEPENDENT_AMBULATORY_CARE_PROVIDER_SITE_OTHER): Payer: PPO

## 2018-05-26 ENCOUNTER — Encounter: Payer: Self-pay | Admitting: Podiatry

## 2018-05-26 DIAGNOSIS — M722 Plantar fascial fibromatosis: Secondary | ICD-10-CM | POA: Diagnosis not present

## 2018-05-26 DIAGNOSIS — M79672 Pain in left foot: Secondary | ICD-10-CM | POA: Diagnosis not present

## 2018-05-26 DIAGNOSIS — M79604 Pain in right leg: Secondary | ICD-10-CM

## 2018-05-26 DIAGNOSIS — M79671 Pain in right foot: Secondary | ICD-10-CM | POA: Diagnosis not present

## 2018-05-26 DIAGNOSIS — R600 Localized edema: Secondary | ICD-10-CM | POA: Diagnosis not present

## 2018-05-26 DIAGNOSIS — M79605 Pain in left leg: Secondary | ICD-10-CM

## 2018-05-27 ENCOUNTER — Telehealth: Payer: Self-pay | Admitting: Emergency Medicine

## 2018-05-27 DIAGNOSIS — J301 Allergic rhinitis due to pollen: Secondary | ICD-10-CM

## 2018-05-27 NOTE — Telephone Encounter (Signed)
Called and spoke with patient, she stated that after her bronchoscopy her results from [facility] were normal. Patients stated that when she coughs or clears her throat she sometimes still has a little blood tinged mucus but it is minor. Patient would like to be referred to an ENT. Patient denies all other symptoms. No fever or sob.  RB please advise, thank you.

## 2018-05-27 NOTE — Progress Notes (Signed)
   HPI: 64-old female presenting today as a new patient with a chief complaint of diffuse pain to the bilateral feet that began 6-7 months ago. She reports associated swelling if she is seated for too long. There are no other modifying factors noted. She has not had any treatment for her symptoms. Patient is here for further evaluation and treatment.   Past Medical History:  Diagnosis Date  . Asthma   . COPD (chronic obstructive pulmonary disease) ([place])   . Diabetes mellitus   . Emphysema of lung ([place])   . Hypercholesteremia   . Hypertension      Physical Exam: General: The patient is alert and oriented x3 in no acute distress.  Dermatology: Skin is warm, dry and supple bilateral lower extremities. Negative for open lesions or macerations.  Vascular: Palpable pedal pulses bilaterally. No edema or erythema noted. Capillary refill within normal limits.  Neurological: Epicritic and protective threshold grossly intact bilaterally.   Musculoskeletal Exam: Generalized foot and ankle pain noted bilaterally. Range of motion within normal limits to all pedal and ankle joints bilateral. Muscle strength 5/5 in all groups bilateral.   Radiographic Exam:  Normal osseous mineralization. Joint spaces preserved. No fracture/dislocation/boney destruction.    Assessment: 1. Generalized foot and ankle pain bilateral    Plan of Care:  1. Patient evaluated. X-Rays reviewed.  2. Prescription for antiinflammatory pain cream to be dispensed by [facility].  3. Compression anklets dispensed bilaterally.  4. Return to clinic as needed.      Edrick Kins, DPM [facility]  Dr. Edrick Kins, DPM    [address], [place] [address]                Office [phone]  Fax [phone]

## 2018-05-28 LAB — ACID FAST CULTURE WITH REFLEXED SENSITIVITIES (MYCOBACTERIA): Acid Fast Culture: NEGATIVE

## 2018-06-01 ENCOUNTER — Telehealth: Payer: Self-pay | Admitting: Endocrinology

## 2018-06-01 ENCOUNTER — Other Ambulatory Visit: Payer: Self-pay

## 2018-06-01 MED ORDER — METFORMIN HCL 850 MG PO TABS: ORAL_TABLET | ORAL | 1 refills | Status: DC

## 2018-06-01 NOTE — Telephone Encounter (Signed)
Rx sent 

## 2018-06-01 NOTE — Telephone Encounter (Signed)
MEDICATION: MetFormin - taking 2 a day  PHARMACY:  [id number]   IS THIS A 90 DAY SUPPLY : Yes  IS PATIENT OUT OF MEDICATION:   IF NOT; HOW MUCH IS LEFT: Yes  LAST APPOINTMENT DATE: @3 /04/2018  NEXT APPOINTMENT DATE:@6 /03/2018  DO WE HAVE YOUR PERMISSION TO LEAVE A DETAILED MESSAGE:  OTHER COMMENTS:    **Let patient know to contact pharmacy at the end of the day to make sure medication is ready. **  ** Please notify patient to allow 48-72 hours to process**  **Encourage patient to contact the pharmacy for refills or they can request refills through [facility]**

## 2018-06-02 NOTE — Telephone Encounter (Signed)
Yes please refer to ENT. Either [facility] or [facility] Quitter preferred. Thanks.

## 2018-06-02 NOTE — Telephone Encounter (Signed)
Called and spoke with pt with RB recommendations Advised sending referral today to ENT to Jerrell Belfast or Melida Quitter office Pt verbalized and expressed understanding Nothing further needed at this time.

## 2018-06-04 ENCOUNTER — Ambulatory Visit: Payer: PPO | Admitting: Dietician

## 2018-06-09 DIAGNOSIS — G4730 Sleep apnea, unspecified: Secondary | ICD-10-CM | POA: Diagnosis not present

## 2018-06-09 DIAGNOSIS — I5189 Other ill-defined heart diseases: Secondary | ICD-10-CM | POA: Diagnosis not present

## 2018-06-09 DIAGNOSIS — E039 Hypothyroidism, unspecified: Secondary | ICD-10-CM | POA: Diagnosis not present

## 2018-06-09 DIAGNOSIS — E1169 Type 2 diabetes mellitus with other specified complication: Secondary | ICD-10-CM | POA: Diagnosis not present

## 2018-06-09 DIAGNOSIS — I10 Essential (primary) hypertension: Secondary | ICD-10-CM | POA: Diagnosis not present

## 2018-06-09 DIAGNOSIS — E1165 Type 2 diabetes mellitus with hyperglycemia: Secondary | ICD-10-CM | POA: Diagnosis not present

## 2018-06-09 DIAGNOSIS — N289 Disorder of kidney and ureter, unspecified: Secondary | ICD-10-CM | POA: Diagnosis not present

## 2018-06-09 DIAGNOSIS — R351 Nocturia: Secondary | ICD-10-CM | POA: Diagnosis not present

## 2018-06-09 DIAGNOSIS — J449 Chronic obstructive pulmonary disease, unspecified: Secondary | ICD-10-CM | POA: Diagnosis not present

## 2018-06-09 DIAGNOSIS — N401 Enlarged prostate with lower urinary tract symptoms: Secondary | ICD-10-CM | POA: Diagnosis not present

## 2018-06-09 DIAGNOSIS — I739 Peripheral vascular disease, unspecified: Secondary | ICD-10-CM | POA: Diagnosis not present

## 2018-06-09 DIAGNOSIS — N390 Urinary tract infection, site not specified: Secondary | ICD-10-CM | POA: Diagnosis not present

## 2018-06-22 DIAGNOSIS — R197 Diarrhea, unspecified: Secondary | ICD-10-CM | POA: Diagnosis not present

## 2018-06-22 DIAGNOSIS — K219 Gastro-esophageal reflux disease without esophagitis: Secondary | ICD-10-CM | POA: Diagnosis not present

## 2018-06-25 DIAGNOSIS — H9202 Otalgia, left ear: Secondary | ICD-10-CM

## 2018-06-25 DIAGNOSIS — M26622 Arthralgia of left temporomandibular joint: Secondary | ICD-10-CM | POA: Diagnosis not present

## 2018-06-25 HISTORY — DX: Arthralgia of left temporomandibular joint: M26.622

## 2018-06-25 HISTORY — DX: Otalgia, left ear: H92.02

## 2018-07-16 ENCOUNTER — Encounter: Payer: Self-pay | Admitting: Dietician

## 2018-07-16 ENCOUNTER — Ambulatory Visit: Payer: PPO | Admitting: Dietician

## 2018-07-16 DIAGNOSIS — E118 Type 2 diabetes mellitus with unspecified complications: Secondary | ICD-10-CM

## 2018-07-16 NOTE — Patient Instructions (Signed)
Consider the breakfast options we discussed to decrease the amount of fat with breakfast.  [place] sausage or vegetarian sausage rather than bacon  Use less oil, butter.  Consider eating breakfast earlier. Have a small amount of protein with each meal and snack.  Chick Peas or other beans (rinsed and drained) on a salad, cottage cheese, lean chicken, hummus, lean meat could all be options to add protein to a salad. Aim for 15-30 minutes of exercise most days.  (small amounts add up)  You tube Armchair exercises  Exercise during the TV commercial breaks Mindfulness with eating.  Am I eating because I am hungry (for nutrition) or for another reason?   When I am wanting to eat when bored, what can I do instead?   Read, crossword puzzle, call someone, exercise.  Consider raw veges or a sugar free popsicle for a snack. Mixed nuts are a good protein choice but also have a lot of calories.    Portion size is what fits in the palm of the hand when shut.

## 2018-07-16 NOTE — Progress Notes (Signed)
Diabetes Self-Management Education  Visit Type: First/Initial  Appt. Start Time: 1055 Appt. End Time: 1130 This visit was completed via telephone due to the COVID-19 pandemic.   I spoke with patient and verified that I was speaking with the correct person with two patient identifiers (full name and date of birth).   I discussed the limitations related to this kind of visit and the patient is willing to proceed.   07/16/2018  [name], identified by name and date of birth, is a 78 y.o. female with a diagnosis of Diabetes: Type 2.   ASSESSMENT  History includes type 2 diabetes since 2005, COPD, HLD, HTN, quit smoking 15 months ago.  She states that she has gained weight since stopping smoking.  Last weight 181 lbs 05/04/18.  She feels that she has continued to gain weight as she is sitting around the house eating.   Her breakfast is high in fat.  She generally eats only 2 meals per day.  Discussed meal consistency.  Discussed mindful eating.    She lives with her sister.  They share shopping and patient cooks.  She is retired from [employer].  She is unable to exercise due to ankle and foot problems.  She states that she has seen a number of doctors but they cannot find the problem.  She used to go to [employer] prior to [employer].  Diabetes Self-Management Education - 07/16/18 1101      Visit Information   Visit Type  First/Initial      Initial Visit   Diabetes Type  Type 2    Are you currently following a meal plan?  No    Are you taking your medications as prescribed?  Yes    Date Diagnosed  2005      Psychosocial Assessment   Patient Belief/Attitude about Diabetes  Motivated to manage diabetes    Self-care barriers  None    Self-management support  Doctor's office;Family    Other persons present  Patient    Patient Concerns  Nutrition/Meal planning;Healthy Lifestyle;Weight Control    Special Needs  None    Preferred Learning Style  No preference indicated    Learning  Readiness  Ready    How often do you need to have someone help you when you read instructions, pamphlets, or other written materials from your doctor or pharmacy?  1 - Never    What is the last grade level you completed in school?  2 years college      Pre-Education Assessment   Patient understands the diabetes disease and treatment process.  Needs Review    Patient understands incorporating nutritional management into lifestyle.  Needs Review    Patient undertands incorporating physical activity into lifestyle.  Needs Review    Patient understands using medications safely.  Needs Review    Patient understands monitoring blood glucose, interpreting and using results  Needs Review    Patient understands prevention, detection, and treatment of acute complications.  Needs Review    Patient understands prevention, detection, and treatment of chronic complications.  Needs Review    Patient understands how to develop strategies to address psychosocial issues.  Needs Review    Patient understands how to develop strategies to promote health/change behavior.  Needs Review      Complications   Last HgB A1C per patient/outside source  7.1 %   05/04/18   How often do you check your blood sugar?  3-4 times / week    Fasting  Blood glucose range (mg/dL)  70-129    Postprandial Blood glucose range (mg/dL)  130-179   140     Dietary Intake   Breakfast  Eggs, sausage or bacon or ham, home fries or hash brown, fruit, coffee with sweetened creamer   10:30-11:00   Lunch  often skips    Biomedical scientist or other meat, starch, and vegetabble OR salad with chicken   5:30-6   Snack (evening)  occasional popcorn, mixed nuts    Beverage(s)  water, coffee, occasional unsweetened tea or SF lemonade      Exercise   Exercise Type  ADL's      Patient Education   Previous Diabetes Education  No    Disease state   Other (comment)   review of insulin resistance   Nutrition management   Meal timing in regards to the  patients' current diabetes medication.;Meal options for control of blood glucose level and chronic complications.;Role of diet in the treatment of diabetes and the relationship between the three main macronutrients and blood glucose level    Physical activity and exercise   Role of exercise on diabetes management, blood pressure control and cardiac health.;Helped patient identify appropriate exercises in relation to his/her diabetes, diabetes complications and other health issue.    Medications  Reviewed patients medication for diabetes, action, purpose, timing of dose and side effects.    Monitoring  Purpose and frequency of SMBG.    Psychosocial adjustment  Role of stress on diabetes    Personal strategies to promote health  Lifestyle issues that need to be addressed for better diabetes care      Individualized Goals (developed by patient)   Nutrition  General guidelines for healthy choices and portions discussed    Physical Activity  Exercise 5-7 days per week;15 minutes per day    Monitoring   test my blood glucose as discussed    Reducing Risk  increase portions of healthy fats    Health Coping  discuss diabetes with (comment)   MD, RD, CDE     Post-Education Assessment   Patient understands the diabetes disease and treatment process.  Demonstrates understanding / competency    Patient understands incorporating nutritional management into lifestyle.  Demonstrates understanding / competency    Patient undertands incorporating physical activity into lifestyle.  Demonstrates understanding / competency    Patient understands using medications safely.  Demonstrates understanding / competency    Patient understands monitoring blood glucose, interpreting and using results  Demonstrates understanding / competency    Patient understands prevention, detection, and treatment of acute complications.  Demonstrates understanding / competency    Patient understands prevention, detection, and treatment of  chronic complications.  Demonstrates understanding / competency    Patient understands how to develop strategies to address psychosocial issues.  Demonstrates understanding / competency    Patient understands how to develop strategies to promote health/change behavior.  Demonstrates understanding / competency      Outcomes   Expected Outcomes  Demonstrated interest in learning. Expect positive outcomes    Future DMSE  PRN    Program Status  Completed       Individualized Plan for Diabetes Self-Management Training:   Learning Objective:  Patient will have a greater understanding of diabetes self-management. Patient education plan is to attend individual and/or group sessions per assessed needs and concerns.   Plan:   Patient Instructions  Consider the breakfast options we discussed to decrease the amount of fat with breakfast.  [place]  sausage or vegetarian sausage rather than bacon  Use less oil, butter.  Consider eating breakfast earlier. Have a small amount of protein with each meal and snack.  Chick Peas or other beans (rinsed and drained) on a salad, cottage cheese, lean chicken, hummus, lean meat could all be options to add protein to a salad. Aim for 15-30 minutes of exercise most days.  (small amounts add up)  You tube Armchair exercises  Exercise during the TV commercial breaks Mindfulness with eating.  Am I eating because I am hungry (for nutrition) or for another reason?   When I am wanting to eat when bored, what can I do instead?   Read, crossword puzzle, call someone, exercise.  Consider raw veges or a sugar free popsicle for a snack. Mixed nuts are a good protein choice but also have a lot of calories.    Portion size is what fits in the palm of the hand when shut.     Expected Outcomes:  Demonstrated interest in learning. Expect positive outcomes  Education material provided: ADA - How to Thrive: A Guide for Your Journey with Diabetes, Meal plan card and Snack  sheet, breakfast ideas, salad ideas.  If problems or questions, patient to contact team via:  Phone and Email  Future DSME appointment: PRN

## 2018-07-29 ENCOUNTER — Ambulatory Visit: Payer: PPO | Admitting: Emergency Medicine

## 2018-08-03 ENCOUNTER — Other Ambulatory Visit (INDEPENDENT_AMBULATORY_CARE_PROVIDER_SITE_OTHER): Payer: PPO

## 2018-08-03 ENCOUNTER — Other Ambulatory Visit: Payer: Self-pay

## 2018-08-03 DIAGNOSIS — E1165 Type 2 diabetes mellitus with hyperglycemia: Secondary | ICD-10-CM | POA: Diagnosis not present

## 2018-08-03 LAB — BASIC METABOLIC PANEL
BUN: 15 mg/dL (ref 6–23)
CO2: 30 mEq/L (ref 19–32)
Calcium: 9.6 mg/dL (ref 8.4–10.5)
Chloride: 102 mEq/L (ref 96–112)
Creatinine, Ser: 0.65 mg/dL (ref 0.40–1.20)
GFR: 106.57 mL/min (ref >60.00)
Glucose, Bld: 104 mg/dL — ABNORMAL HIGH (ref 70–99)
Potassium: 4 mEq/L (ref 3.5–5.1)
Sodium: 140 mEq/L (ref 135–145)

## 2018-08-03 LAB — HEMOGLOBIN A1C: Hgb A1c MFr Bld: 7.2 % — ABNORMAL HIGH (ref 4.6–6.5)
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
## 2018-08-05 ENCOUNTER — Ambulatory Visit (INDEPENDENT_AMBULATORY_CARE_PROVIDER_SITE_OTHER): Payer: PPO | Admitting: Endocrinology

## 2018-08-05 ENCOUNTER — Encounter: Payer: Self-pay | Admitting: Endocrinology

## 2018-08-05 ENCOUNTER — Other Ambulatory Visit: Payer: Self-pay

## 2018-08-05 DIAGNOSIS — E7800 Pure hypercholesterolemia, unspecified: Secondary | ICD-10-CM | POA: Diagnosis not present

## 2018-08-05 DIAGNOSIS — E1165 Type 2 diabetes mellitus with hyperglycemia: Secondary | ICD-10-CM | POA: Diagnosis not present

## 2018-08-05 MED ORDER — CANAGLIFLOZIN 100 MG PO TABS: ORAL_TABLET | ORAL | 3 refills | Status: DC

## 2018-08-05 NOTE — Patient Instructions (Signed)
To get better control and other benefits including weight loss and potentially less diabetic complications you will be taking Invokana 100 mg daily before breakfast daily You should be able to find a 30-day free trial card to print on the [email or web address] website otherwise let us know  To continue metformin unchanged Hydrochlorothiazide will be stopped with starting Invokana Remember to increase your fluid intake To prevent yeast infections recommend also practice good hygiene and avoid moist conditions  Please try to check sugars about 2 hours after dinner May be beneficial to eat 3-4 small meals instead of larger meals Important to cut back on higher fat and higher carbohydrate foods Please call back to schedule follow-up in about 2 months

## 2018-09-15 DIAGNOSIS — M779 Enthesopathy, unspecified: Secondary | ICD-10-CM | POA: Diagnosis not present

## 2018-09-15 DIAGNOSIS — M2550 Pain in unspecified joint: Secondary | ICD-10-CM | POA: Diagnosis not present

## 2018-09-22 ENCOUNTER — Encounter: Payer: Self-pay | Admitting: Emergency Medicine

## 2018-09-22 ENCOUNTER — Ambulatory Visit: Payer: PPO | Admitting: Emergency Medicine

## 2018-09-22 ENCOUNTER — Other Ambulatory Visit: Payer: Self-pay

## 2018-09-22 DIAGNOSIS — J449 Chronic obstructive pulmonary disease, unspecified: Secondary | ICD-10-CM

## 2018-09-22 DIAGNOSIS — G4733 Obstructive sleep apnea (adult) (pediatric): Secondary | ICD-10-CM

## 2018-09-22 NOTE — Patient Instructions (Addendum)
We will continue Pro-Air available to use 2 puffs up to every 4 hours if needed for shortness of breath, chest tightness, wheezing.  We may consider re-starting BiPAP in the future depending on how your breathing and sleep are doing.  Get the flu shot this fall.  Follow with Dr Lamonte Sakai in 6 months or sooner if you have any problems

## 2018-09-22 NOTE — Assessment & Plan Note (Signed)
She is no longer using her BiPAP, has decided that she does not want to go back to it at least for now.  If she clinically changes we may need to reconsider.

## 2018-09-22 NOTE — Progress Notes (Signed)
   Subjective:    Patient ID: Angel Tanner, female    DOB: 01/21/1941, 78 y.o.   MRN: [id number]  HPI     ROV 04/24/2018 --Angel Tanner follows up today.  She is 78, has a history of COPD, benign pulmonary nodular disease that we followed previously with CT scans of the chest, obstructive sleep apnea on BiPAP.  She is intermittently had blood-tinged mucus, improved at least initially with increasing the humidity in her BiPAP.  It recurred earlier this month.  I performed bronchoscopy on 04/14/2018 that showed significant posterior pharyngeal and periglottic edema but no clear bleeding source.  There was some hypopigmented right middle lobe airway mucosa so that was brushed and was benign.  Bacterial culture shows normal flora, AFB and fungal smears negative, cultures pending.  She has daily mild GERD sx on omeprazole prn, on loratadine qd. Minimal cough, remains on lisinopril. She stopped her BiPAP about 2 months ago due to discomfort, waking her up. She is planning to go to [facility] to see if there are other mask options.   ROV 09/22/2018 --follow-up visit for 78 year old woman with moderate COPD, pulmonary nodular disease which we have followed with serial CT scans, obstructive sleep apnea for which she was previously on BiPAP but has not been able to tolerate for several months.  We performed bronchoscopy 04/14/2018 that was unrevealing.  She is not currently on any bronchodilator therapy.  She was referred to ENT Dr. Redmond Baseman for otalgia.  Her blood-tinged sputum was improved and so a laryngoscopy was deferred. She believes it may be dental. She has albuterol, believes it may help her some, uses almost once a day. Walks slowly, has trouble with stairs.       Objective:   Physical Exam Vitals:   09/22/18 1622 09/22/18 1623  BP:  122/80  Pulse:  80  Temp: 98.4 F (36.9 C)   TempSrc: Oral   SpO2:  95%  Weight: 178 lb 12.8 oz (81.1 kg)   Height: 5' 1.5" (1.562 m)    Gen: Pleasant, obese woman, in no  distress,  normal affect  ENT: No lesions,  mouth clear,  oropharynx clear, no postnasal drip  Neck: No JVD, no  stridor  Lungs: No use of accessory muscles, small volumes, no wheeze or crackles.   Cardiovascular: RRR, heart sounds normal, no murmur or gallops, no peripheral edema  Musculoskeletal: No deformities, no cyanosis or clubbing  Neuro: alert, non focal  Skin: Warm, no lesions or rashes       Assessment & Plan:   COPD (chronic obstructive pulmonary disease) (HCC) I discussed possibly retrying Spiriva given her exertional dyspnea.  She is happy with the albuterol wants to continue to use it as needed, avoid scheduled medication for now.  Flu shot in the fall  Obstructive sleep apnea She is no longer using her BiPAP, has decided that she does not want to go back to it at least for now.  If she clinically changes we may need to reconsider.    Baltazar Apo, MD, PhD 09/22/2018, 4:47 PM [facility] [phone] or if no answer [phone]

## 2018-09-22 NOTE — Assessment & Plan Note (Addendum)
I discussed possibly retrying Spiriva given her exertional dyspnea.  She is happy with the albuterol wants to continue to use it as needed, avoid scheduled medication for now.  Flu shot in the fall

## 2018-10-05 ENCOUNTER — Other Ambulatory Visit (INDEPENDENT_AMBULATORY_CARE_PROVIDER_SITE_OTHER): Payer: PPO

## 2018-10-05 ENCOUNTER — Other Ambulatory Visit: Payer: Self-pay

## 2018-10-05 DIAGNOSIS — E7800 Pure hypercholesterolemia, unspecified: Secondary | ICD-10-CM | POA: Diagnosis not present

## 2018-10-05 DIAGNOSIS — E1165 Type 2 diabetes mellitus with hyperglycemia: Secondary | ICD-10-CM | POA: Diagnosis not present

## 2018-10-05 LAB — BASIC METABOLIC PANEL
BUN: 22 mg/dL (ref 6–23)
CO2: 28 mEq/L (ref 19–32)
Calcium: 9.8 mg/dL (ref 8.4–10.5)
Chloride: 104 mEq/L (ref 96–112)
Creatinine, Ser: 0.7 mg/dL (ref 0.40–1.20)
GFR: 97.8 mL/min (ref >60.00)
Glucose, Bld: 100 mg/dL — ABNORMAL HIGH (ref 70–99)
Potassium: 4.4 mEq/L (ref 3.5–5.1)
Sodium: 140 mEq/L (ref 135–145)

## 2018-10-05 LAB — LDL CHOLESTEROL, DIRECT: Direct LDL: 38 mg/dL

## 2018-10-06 LAB — FRUCTOSAMINE: Fructosamine: 249 umol/L (ref 0–285)

## 2018-10-07 ENCOUNTER — Ambulatory Visit (INDEPENDENT_AMBULATORY_CARE_PROVIDER_SITE_OTHER): Payer: PPO | Admitting: Endocrinology

## 2018-10-07 ENCOUNTER — Other Ambulatory Visit: Payer: Self-pay

## 2018-10-07 ENCOUNTER — Encounter: Payer: Self-pay | Admitting: Endocrinology

## 2018-10-07 DIAGNOSIS — E1165 Type 2 diabetes mellitus with hyperglycemia: Secondary | ICD-10-CM | POA: Diagnosis not present
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
## 2018-10-09 ENCOUNTER — Telehealth: Payer: Self-pay | Admitting: Primary Care

## 2018-10-09 ENCOUNTER — Ambulatory Visit: Admission: RE | Admit: 2018-10-09 | Payer: PPO | Source: Ambulatory Visit

## 2018-10-09 MED ORDER — ONDANSETRON HCL 4 MG PO TABS: 4.0000 mg | ORAL_TABLET | ORAL | 0 refills | Status: DC

## 2018-10-09 MED ORDER — DIPHENHYDRAMINE HCL 50 MG PO TABS: 50.0000 mg | ORAL_TABLET | ORAL | 0 refills | Status: DC

## 2018-10-09 NOTE — Telephone Encounter (Signed)
Patient has CT scheduled next week, hx n/v  reaction with IV dye. Sending in prescription for oral zofran for nausea and benadryl to take 1 hour before scan. Recommendations for RT.,

## 2018-10-16 ENCOUNTER — Other Ambulatory Visit: Payer: Self-pay

## 2018-10-16 ENCOUNTER — Ambulatory Visit
Admission: RE | Admit: 2018-10-16 | Discharge: 2018-10-16 | Disposition: A | Discharge status: Home / Self Care | Payer: PPO | Source: Ambulatory Visit | Attending: Primary Care | Admitting: Primary Care

## 2018-10-16 DIAGNOSIS — I712 Thoracic aortic aneurysm, without rupture, unspecified: Secondary | ICD-10-CM

## 2018-10-16 MED ORDER — IOHEXOL 350 MG/ML SOLN
75.0000 mL | Freq: Once | INTRAVENOUS | Status: AC | PRN
  Administered 2018-10-16: 75 mL via INTRAVENOUS

## 2018-10-16 NOTE — Progress Notes (Signed)
Please let patient know CTA showed stable 4 cm ascending thoracic aortic aneurysm. Recommend annual imaging follow-up. Atherosclerosis, including aortic and coronary artery disease. Follow up with PCP regarding these findings

## 2018-11-23 ENCOUNTER — Other Ambulatory Visit: Payer: Self-pay | Admitting: Endocrinology

## 2018-11-24 ENCOUNTER — Other Ambulatory Visit: Payer: Self-pay | Admitting: Endocrinology

## 2018-12-21 DIAGNOSIS — Z1231 Encounter for screening mammogram for malignant neoplasm of breast: Secondary | ICD-10-CM | POA: Diagnosis not present

## 2018-12-21 DIAGNOSIS — Z803 Family history of malignant neoplasm of breast: Secondary | ICD-10-CM | POA: Diagnosis not present

## 2018-12-31 ENCOUNTER — Other Ambulatory Visit: Payer: Self-pay

## 2018-12-31 ENCOUNTER — Ambulatory Visit: Payer: PPO | Admitting: Podiatry

## 2018-12-31 ENCOUNTER — Encounter: Payer: Self-pay | Admitting: Podiatry

## 2018-12-31 DIAGNOSIS — E7800 Pure hypercholesterolemia, unspecified: Secondary | ICD-10-CM | POA: Diagnosis not present

## 2018-12-31 DIAGNOSIS — M25519 Pain in unspecified shoulder: Secondary | ICD-10-CM | POA: Diagnosis not present

## 2018-12-31 DIAGNOSIS — I739 Peripheral vascular disease, unspecified: Secondary | ICD-10-CM | POA: Diagnosis not present

## 2018-12-31 DIAGNOSIS — Z23 Encounter for immunization: Secondary | ICD-10-CM | POA: Diagnosis not present

## 2018-12-31 DIAGNOSIS — H409 Unspecified glaucoma: Secondary | ICD-10-CM | POA: Diagnosis not present

## 2018-12-31 DIAGNOSIS — E1169 Type 2 diabetes mellitus with other specified complication: Secondary | ICD-10-CM | POA: Diagnosis not present

## 2018-12-31 DIAGNOSIS — E119 Type 2 diabetes mellitus without complications: Secondary | ICD-10-CM | POA: Diagnosis not present

## 2018-12-31 DIAGNOSIS — I10 Essential (primary) hypertension: Secondary | ICD-10-CM | POA: Diagnosis not present

## 2018-12-31 DIAGNOSIS — Z0000 Encounter for general adult medical examination without abnormal findings: Secondary | ICD-10-CM | POA: Diagnosis not present

## 2018-12-31 DIAGNOSIS — M25539 Pain in unspecified wrist: Secondary | ICD-10-CM | POA: Diagnosis not present

## 2018-12-31 DIAGNOSIS — Z1389 Encounter for screening for other disorder: Secondary | ICD-10-CM | POA: Diagnosis not present

## 2018-12-31 DIAGNOSIS — E1151 Type 2 diabetes mellitus with diabetic peripheral angiopathy without gangrene: Secondary | ICD-10-CM | POA: Diagnosis not present

## 2018-12-31 DIAGNOSIS — J449 Chronic obstructive pulmonary disease, unspecified: Secondary | ICD-10-CM | POA: Diagnosis not present

## 2018-12-31 DIAGNOSIS — I5189 Other ill-defined heart diseases: Secondary | ICD-10-CM | POA: Diagnosis not present

## 2018-12-31 DIAGNOSIS — R21 Rash and other nonspecific skin eruption: Secondary | ICD-10-CM | POA: Diagnosis not present

## 2018-12-31 DIAGNOSIS — G4730 Sleep apnea, unspecified: Secondary | ICD-10-CM | POA: Diagnosis not present

## 2018-12-31 NOTE — Progress Notes (Signed)
This patient presents to the office with chief complaint of  diabetic feet.  .   Patient has no history of infection or drainage from both feet.   Patient says she has been dealing with swelling but her swelling has resolved wearing a compression sock.  This patient presents  to the office today for  a foot evaluation due to history of  diabetes.  General Appearance  Alert, conversant and in no acute stress.  Vascular  Dorsalis pedis  are palpable  bilaterally. Posterior tibial pulses are absent  B/L. Capillary return is within normal limits  bilaterally. Temperature is within normal limits  bilaterally.  Neurologic  Senn-Weinstein monofilament wire test within normal limits  bilaterally. Muscle power within normal limits bilaterally.  Nails Thick disfigured discolored nails with subungual debris  from hallux to fifth toes bilaterally. No evidence of bacterial infection or drainage bilaterally.  Orthopedic  No limitations of motion of motion feet .  No crepitus or effusions noted.  No bony pathology or digital deformities noted. S/P bunion/5th toes surgery  B/L.  Skin  normotropic skin with no porokeratosis noted bilaterally.  No signs of infections or ulcers noted.        Diabetes with no foot complications  IE   A diabetic foot exam was performed and there is no evidence of any vascular or neurologic pathology.   RTC 1 year.   Gardiner Barefoot DPM

## 2019-01-05 ENCOUNTER — Other Ambulatory Visit: Payer: Self-pay

## 2019-01-06 ENCOUNTER — Ambulatory Visit (INDEPENDENT_AMBULATORY_CARE_PROVIDER_SITE_OTHER): Payer: PPO | Admitting: Endocrinology

## 2019-01-06 ENCOUNTER — Encounter: Payer: Self-pay | Admitting: Endocrinology

## 2019-01-06 VITALS — BP 150/88 | HR 82 | Ht 61.5 in | Wt 179.8 lb

## 2019-01-06 DIAGNOSIS — E1165 Type 2 diabetes mellitus with hyperglycemia: Secondary | ICD-10-CM
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
## 2019-01-07 LAB — POCT GLYCOSYLATED HEMOGLOBIN (HGB A1C): Hemoglobin A1C: 6.2 % — AB (ref 4.0–5.6)

## 2019-02-12 ENCOUNTER — Other Ambulatory Visit: Payer: Self-pay | Admitting: Internal Medicine

## 2019-02-12 ENCOUNTER — Ambulatory Visit
Admission: RE | Admit: 2019-02-12 | Discharge: 2019-02-12 | Disposition: A | Discharge status: Home / Self Care | Payer: PPO | Source: Ambulatory Visit | Attending: Internal Medicine | Admitting: Internal Medicine

## 2019-02-12 DIAGNOSIS — I10 Essential (primary) hypertension: Secondary | ICD-10-CM | POA: Diagnosis not present

## 2019-02-12 DIAGNOSIS — M25532 Pain in left wrist: Secondary | ICD-10-CM

## 2019-02-12 DIAGNOSIS — M19032 Primary osteoarthritis, left wrist: Secondary | ICD-10-CM | POA: Diagnosis not present

## 2019-02-12 DIAGNOSIS — M2550 Pain in unspecified joint: Secondary | ICD-10-CM | POA: Diagnosis not present

## 2019-02-17 ENCOUNTER — Other Ambulatory Visit: Payer: Self-pay | Admitting: *Deleted

## 2019-02-17 DIAGNOSIS — I712 Thoracic aortic aneurysm, without rupture, unspecified: Secondary | ICD-10-CM

## 2019-02-23 DIAGNOSIS — I10 Essential (primary) hypertension: Secondary | ICD-10-CM | POA: Diagnosis not present

## 2019-02-24 DIAGNOSIS — I10 Essential (primary) hypertension: Secondary | ICD-10-CM | POA: Diagnosis not present

## 2019-03-09 DIAGNOSIS — I10 Essential (primary) hypertension: Secondary | ICD-10-CM | POA: Diagnosis not present

## 2019-03-24 ENCOUNTER — Encounter: Payer: Self-pay | Admitting: Adult Health

## 2019-03-24 ENCOUNTER — Ambulatory Visit: Payer: PPO | Admitting: Adult Health

## 2019-03-24 ENCOUNTER — Other Ambulatory Visit: Payer: Self-pay

## 2019-03-24 DIAGNOSIS — R918 Other nonspecific abnormal finding of lung field: Secondary | ICD-10-CM

## 2019-03-24 DIAGNOSIS — G4733 Obstructive sleep apnea (adult) (pediatric): Secondary | ICD-10-CM

## 2019-03-24 DIAGNOSIS — J449 Chronic obstructive pulmonary disease, unspecified: Secondary | ICD-10-CM

## 2019-03-24 NOTE — Assessment & Plan Note (Signed)
Mild to moderate obstructive sleep apnea.  Previously intolerant to BiPAP. Discussed with patient however she does not want to restart any type of sleep machine.

## 2019-03-24 NOTE — Patient Instructions (Signed)
Activity as tolerated.  ProAir 1-2  Puffs every 4-6hrs as needed for wheezing .  Follow up with Dr. Lamonte Sakai in 1 year and As needed

## 2019-03-24 NOTE — Assessment & Plan Note (Signed)
Moderate COPD with no significant symptom burden.  Patient is encouraged on activity as tolerated.  Use ProAir if needed.

## 2019-03-24 NOTE — Progress Notes (Signed)
@Patient  ID: Angel Tanner, female    DOB: 09-13-1940, 79 y.o.   MRN: [id number]  Chief Complaint  Patient presents with  . Follow-up    COPD     Referring provider: Seward Carol, MD  HPI: 79 year old female former smoker followed for moderate COPD, pulmonary nodular disease-stable on serial CT chest compatible with benign etiology and obstructive sleep apnea (BiPAP intolerant) Hemoptysis 04/2018 -Bronch neg .   TEST/EVENTS :  Bronchoscopy April 14, 2018> cytology negative for malignant cells, culture negative  PFTs 06/2017- FVC 1.4 (85%) FEV1 0.91 (72%), RATIO 65 (85%), DLCO 16.19 (88%) PFTs 2015- FVC 1.62 (91%), FEV1 1.08 (79%), RATIO 67 (86%), DLCO 8.74 (48%)    2D echo August 26, 2017 showed EF [id number], grade 1 diastolic dysfunction, pulmonary artery pressure within normal range  CT chest 03/25/2018 stable 4.1 cm ascending aortic aneurysm.  Stable pulmonary nodules distant with benign etiology  Sleep study June 25, 2017 showed mild to moderate sleep apnea with AHI 14.3.  With SPO2 low 86%.  Recommend CPAP 9 cm H2O.  Developed central apneas with higher pressure settings.  Did not require supplemental oxygen during study.   03/24/2019 Follow up : COPD , obstructive sleep apnea, lung nodules Patient returns for a 34-month follow-up.  Patient has underlying moderate COPD.  She has no significant symptom burden.  Says that she is active around the house but does not do any significant exercise.  She has no significant shortness of breath cough or wheezing.  She has rare use of her ProAir inhaler.  Denies any hemoptysis, chest pain orthopnea PND or leg swelling.  Patient did have have hemoptysis in early 2020.  She underwent a bronchoscopy that cytology was negative for malignant cells.  Cultures were negative. CT chest January 2020 showed stable pulmonary nodules consistent with benign etiology  Patient has underlying mild to moderate sleep apnea.  Had a split-night sleep study  April 2019.  That showed AHI of 14.3.  Patient was on BiPAP for several months.  But stopped using BiPAP last year said that she could not tolerate.  She says that she slept less with BiPAP than she does without it.  She does not want to retry this.    Allergies  Allergen Reactions  . Iodine Nausea And Vomiting    Occurred with last two CT scans    Immunization History  Administered Date(s) Administered  . Influenza Split 12/01/2013  . Influenza Whole 01/05/2008, 12/21/2008, 12/07/2009  . Influenza, High Dose Seasonal PF 12/03/2018  . Influenza-Unspecified 12/22/2017  . Pneumococcal Polysaccharide-23 01/05/2008, 12/01/2013  . Tdap 09/23/2017  . Zoster 07/07/2009    Past Medical History:  Diagnosis Date  . Asthma   . COPD (chronic obstructive pulmonary disease) ([place])   . Diabetes mellitus   . Emphysema of lung ([place])   . Hypercholesteremia   . Hypertension     Tobacco History: Social History   Tobacco Use  Smoking Status Former Smoker  . Packs/day: 0.50  . Years: 45.00  . Pack years: 22.50  . Types: Cigarettes  . Quit date: 04/05/2017  . Years since quitting: 1.9  Smokeless Tobacco Never Used   Counseling given: Not Answered   Outpatient Medications Prior to Visit  Medication Sig Dispense Refill  . Albuterol Sulfate (PROAIR RESPICLICK) 123XX123 (90 Base) MCG/ACT AEPB Inhale 2 puffs into the lungs every 4 (four) hours as needed (wheezing, tightness, shortness of breath). 1 each 5  . aspirin 81 MG chewable tablet Chew 81  mg by mouth at bedtime.     . Calcium Carb-Cholecalciferol (CALCIUM + D3 PO) Take 1 tablet by mouth daily.    Marland Kitchen dextromethorphan (DELSYM) 30 MG/5ML liquid Take 30 mg by mouth 2 (two) times daily.    . famotidine (PEPCID) 10 MG tablet Take 10 mg by mouth 2 (two) times daily.    Marland Kitchen glucose blood test strip 1 each by Other route as needed. Use as instructed    . INVOKANA 100 MG TABS tablet 1 TABLET BEFORE BREAKFAST 30 tablet 3  . latanoprost (XALATAN) 0.005 %  ophthalmic solution Place 1 drop into both eyes at bedtime.    Marland Kitchen lisinopril (PRINIVIL,ZESTRIL) 20 MG tablet Take 20 mg by mouth daily.    . metFORMIN (GLUCOPHAGE) 850 MG tablet TAKE 2 TABLETS BY MOUTH EVERY DAY 180 tablet 1  . Polyethyl Glycol-Propyl Glycol (SYSTANE) 0.4-0.3 % SOLN Place 1 drop into both eyes 2 (two) times daily as needed (dry/irritated eyes.).    Marland Kitchen rosuvastatin (CRESTOR) 10 MG tablet Take 10 mg by mouth at bedtime.     . diphenhydrAMINE (BENADRYL) 50 MG tablet Take 1 tablet (50 mg total) by mouth 60 (sixty) minutes before procedure for 1 dose. 5 tablet 0  . loratadine-pseudoephedrine (CLARITIN-D 24-HOUR) 10-240 MG 24 hr tablet Take 1 tablet by mouth at bedtime.    Marland Kitchen omeprazole (PRILOSEC) 40 MG capsule Take 40 mg by mouth daily before breakfast.     . ondansetron (ZOFRAN) 4 MG tablet Take 1 tablet (4 mg total) by mouth 60 (sixty) minutes before procedure for 1 dose. 5 tablet 0   No facility-administered medications prior to visit.     Review of Systems:   Constitutional:   No  weight loss, night sweats,  Fevers, chills, fatigue, or  lassitude.  HEENT:   No headaches,  Difficulty swallowing,  Tooth/dental problems, or  Sore throat,                No sneezing, itching, ear ache, nasal congestion, post nasal drip,   CV:  No chest pain,  Orthopnea, PND, swelling in lower extremities, anasarca, dizziness, palpitations, syncope.   GI  No heartburn, indigestion, abdominal pain, nausea, vomiting, diarrhea, change in bowel habits, loss of appetite, bloody stools.   Resp: No shortness of breath with exertion or at rest.  No excess mucus, no productive cough,  No non-productive cough,  No coughing up of blood.  No change in color of mucus.  No wheezing.  No chest wall deformity  Skin: no rash or lesions.  GU: no dysuria, change in color of urine, no urgency or frequency.  No flank pain, no hematuria   MS:  No joint pain or swelling.  No decreased range of motion.  No back  pain.    Physical Exam  BP 138/84 (BP Location: Left Arm, Cuff Size: Normal)   Pulse 70   Temp (!) 97.1 F (36.2 C) (Temporal)   Ht 5' 1.5" (1.562 m)   Wt 174 lb 9.6 oz (79.2 kg)   SpO2 97% Comment: RA  BMI 32.46 kg/m   GEN: A/Ox3; pleasant , NAD, well nourished    HEENT:  [place]/AT,    NOSE-clear, THROAT-clear, no lesions, no postnasal drip or exudate noted.   NECK:  Supple w/ fair ROM; no JVD; normal carotid impulses w/o bruits; no thyromegaly or nodules palpated; no lymphadenopathy.    RESP  Clear  P & A; w/o, wheezes/ rales/ or rhonchi. no accessory muscle use, no  dullness to percussion  CARD:  RRR, no m/r/g, no peripheral edema, pulses intact, no cyanosis or clubbing.  GI:   Soft & nt; nml bowel sounds; no organomegaly or masses detected.   Musco: Warm bil, no deformities or joint swelling noted.   Neuro: alert, no focal deficits noted.    Skin: Warm, no lesions or rashes    Lab Results:   BMET  BNP No results found for: BNP  ProBNP No results found for: PROBNP  Imaging: No results found.    PFT Results Latest Ref Rng & Units 06/13/2017 04/28/2013  FVC-Pre L 1.40 1.62  FVC-Predicted Pre % 85 91  FVC-Post L 1.30 1.66  FVC-Predicted Post % 79 93  Pre FEV1/FVC % % 65 67  Post FEV1/FCV % % 68 70  FEV1-Pre L 0.91 1.08  FEV1-Predicted Pre % 72 79  FEV1-Post L 0.89 1.16  DLCO UNC% % 88 48  DLCO COR %Predicted % 210 92  TLC L 3.64 4.04  TLC % Predicted % 83 92  RV % Predicted % 105 110    No results found for: NITRICOXIDE      Assessment & Plan:   COPD (chronic obstructive pulmonary disease) ([facility]) Moderate COPD with no significant symptom burden.  Patient is encouraged on activity as tolerated.  Use ProAir if needed.   Pulmonary nodules Stable on serial CT chest. Bronchoscopy early 2020 with negative cytology.  Obstructive sleep apnea Mild to moderate obstructive sleep apnea.  Previously intolerant to BiPAP. Discussed with patient however  she does not want to restart any type of sleep machine.     Total patient care time 32 minutes Rexene Edison, NP 03/24/2019

## 2019-03-24 NOTE — Assessment & Plan Note (Signed)
Stable on serial CT chest. Bronchoscopy early 2020 with negative cytology.

## 2019-03-27 ENCOUNTER — Other Ambulatory Visit: Payer: Self-pay | Admitting: Endocrinology

## 2019-03-31 ENCOUNTER — Ambulatory Visit
Admission: RE | Admit: 2019-03-31 | Discharge: 2019-03-31 | Disposition: A | Discharge status: Home / Self Care | Payer: PPO | Source: Ambulatory Visit | Attending: Cardiothoracic Surgery | Admitting: Cardiothoracic Surgery

## 2019-03-31 ENCOUNTER — Telehealth: Payer: Self-pay | Admitting: Cardiothoracic Surgery

## 2019-03-31 ENCOUNTER — Other Ambulatory Visit: Payer: Self-pay

## 2019-03-31 ENCOUNTER — Telehealth: Payer: PPO | Admitting: Cardiothoracic Surgery

## 2019-03-31 DIAGNOSIS — I712 Thoracic aortic aneurysm, without rupture, unspecified: Secondary | ICD-10-CM

## 2019-03-31 NOTE — Telephone Encounter (Signed)
[address]       [place] [address]             [phone]     CARDIOTHORACIC SURGERY TELEPHONE VIRTUAL OFFICE NOTE  Referring Provider is No ref. provider found Primary Cardiologist is No primary care provider on file. PCP is Seward Carol, MD   HPI:  I spoke with Angel Tanner (DOB 10/18/1940 ) via telephone on 03/31/2019 at 3:02 PM and verified that I was speaking with the correct person using more than one form of identification.  We discussed the reason(s) for conducting our visit virtually instead of in-person.  The patient expressed understanding the circumstances and agreed to proceed as described.  Patient contacted after annual surveillance CT scan of chest following a asymptomatic fusiform ascending aneurysm measuring 4.1 cm stable since 2016.  She has hypertension which is managed with lisinopril triamterene and she takes Crestor.  She does not smoke anymore.  The aneurysm remains asymptomatic.  Her last blood pressure recorded in the medical record is 138/84.  She states at home her blood pressure yesterday was 143/82.  I reviewed the images of the scan today showing no change in her ascending aorta measurement of 4.1 cm.  She has a small stable right lower lobe nodule which has been unchanged for several years.   Current Outpatient Medications  Medication Sig Dispense Refill  . Albuterol Sulfate (PROAIR RESPICLICK) 123XX123 (90 Base) MCG/ACT AEPB Inhale 2 puffs into the lungs every 4 (four) hours as needed (wheezing, tightness, shortness of breath). 1 each 5  . aspirin 81 MG chewable tablet Chew 81 mg by mouth at bedtime.     . Calcium Carb-Cholecalciferol (CALCIUM + D3 PO) Take 1 tablet by mouth daily.    Marland Kitchen dextromethorphan (DELSYM) 30 MG/5ML liquid Take 30 mg by mouth 2 (two) times daily.    . famotidine (PEPCID) 10 MG tablet Take 10 mg by mouth 2 (two) times daily.    Marland Kitchen glucose blood test strip 1 each by Other route as needed. Use as instructed     . INVOKANA 100 MG TABS tablet 1 TABLET BEFORE BREAKFAST 30 tablet 3  . latanoprost (XALATAN) 0.005 % ophthalmic solution Place 1 drop into both eyes at bedtime.    Marland Kitchen lisinopril (PRINIVIL,ZESTRIL) 20 MG tablet Take 20 mg by mouth daily.    . metFORMIN (GLUCOPHAGE) 850 MG tablet TAKE 2 TABLETS BY MOUTH EVERY DAY 180 tablet 1  . Polyethyl Glycol-Propyl Glycol (SYSTANE) 0.4-0.3 % SOLN Place 1 drop into both eyes 2 (two) times daily as needed (dry/irritated eyes.).    Marland Kitchen rosuvastatin (CRESTOR) 10 MG tablet Take 10 mg by mouth at bedtime.      No current facility-administered medications for this visit.     Diagnostic Tests:  CT scan without contrast personally reviewed and discussed with patient showing a stable 4.1 cm fusiform ascending aneurysm  Impression:  Stable fusiform ascending aneurysm 4.1 cm Low risk for dissection less than 1% Best therapy is continued blood pressure control and surveillance scanning Plan:  We will arrange for a follow-up CT scan of the chest later next year.    I discussed limitations of evaluation and management via telephone.  The patient was advised to call back for repeat telephone consultation or to seek an in-person evaluation if questions arise or the patient's clinical condition changes in any significant manner.  I spent in excess of 5 minutes of non-face-to-face time during the conduct  of this telephone virtual office consultation.  Level 1  (99441)             5-10 minutes Level 2  (99442)            11-20 minutes Level 3  (99443)            21-30 minutes    03/31/2019 3:02 PM

## 2019-04-02 DIAGNOSIS — R1310 Dysphagia, unspecified: Secondary | ICD-10-CM | POA: Diagnosis not present

## 2019-04-02 DIAGNOSIS — K219 Gastro-esophageal reflux disease without esophagitis: Secondary | ICD-10-CM | POA: Diagnosis not present

## 2019-04-02 DIAGNOSIS — K5900 Constipation, unspecified: Secondary | ICD-10-CM | POA: Diagnosis not present

## 2019-04-29 ENCOUNTER — Ambulatory Visit: Payer: PPO | Admitting: Podiatry

## 2019-04-29 ENCOUNTER — Other Ambulatory Visit: Payer: Self-pay

## 2019-04-29 ENCOUNTER — Encounter: Payer: Self-pay | Admitting: Podiatry

## 2019-04-29 DIAGNOSIS — E119 Type 2 diabetes mellitus without complications: Secondary | ICD-10-CM | POA: Diagnosis not present

## 2019-04-29 DIAGNOSIS — M205X2 Other deformities of toe(s) (acquired), left foot: Secondary | ICD-10-CM

## 2019-04-29 HISTORY — DX: Other deformities of toe(s) (acquired), left foot: M20.5X2

## 2019-04-29 NOTE — Progress Notes (Signed)
This patient presents to the office with chief complaint of  diabetic feet.  .   Patient has no history of infection or drainage from both feet.     This patient presents  to the office today for  a foot evaluation due to history of  Diabetes. Patient was seen 4 months ago.  General Appearance  Alert, conversant and in no acute stress.  Vascular  Dorsalis pedis  are palpable  bilaterally. Posterior tibial pulses are absent  B/L. Capillary return is within normal limits  bilaterally. Temperature is within normal limits  bilaterally.  Neurologic  Senn-Weinstein monofilament wire test within normal limits  bilaterally. Muscle power within normal limits bilaterally.  Nails Thick disfigured discolored nails with subungual debris  from hallux to fifth toes bilaterally. No evidence of bacterial infection or drainage bilaterally.  Orthopedic  No limitations of motion of motion feet .  No crepitus or effusions noted.  No bony pathology or digital deformities noted. S/P bunion/5th toes surgery  B/L.  Mallet toe 4th left.  Skin  normotropic skin with no porokeratosis noted bilaterally.  No signs of infections or ulcers noted.        Diabetes with no foot complications  ROV  A diabetic foot exam was performed and there is no evidence of any vascular or neurologic pathology.   RTC 1 year.   Gardiner Barefoot DPM

## 2019-04-30 DIAGNOSIS — Z23 Encounter for immunization: Secondary | ICD-10-CM | POA: Diagnosis not present

## 2019-05-03 ENCOUNTER — Other Ambulatory Visit: Payer: Self-pay

## 2019-05-03 ENCOUNTER — Telehealth: Payer: Self-pay | Admitting: Endocrinology

## 2019-05-03 MED ORDER — GLUCOSE BLOOD VI STRP: ORAL_STRIP | 3 refills | Status: DC

## 2019-05-03 NOTE — Telephone Encounter (Signed)
Rx sent for OneTouch Verio test strips. A request from pt's pharmacy has not been received as of this date.

## 2019-05-03 NOTE — Telephone Encounter (Signed)
Patient called re: Patient states [facility] has been sending a request for a RX for One Touch Test Strips but PHARM has had no response. Patient requests the following new RX:  MEDICATION: One Touch Test Strips  PHARMACY:   [id number] [place], [place] - [address] Phone:  [phone]  Fax:  [phone]      IS THIS A 90 DAY SUPPLY : Yes  IS PATIENT OUT OF MEDICATION: No  IF NOT; HOW MUCH IS LEFT: a few  LAST APPOINTMENT DATE: @1 /23/2021  NEXT APPOINTMENT DATE:@3 /06/2019  DO WE HAVE YOUR PERMISSION TO LEAVE A DETAILED MESSAGE: Yes  OTHER COMMENTS:    **Let patient know to contact pharmacy at the end of the day to make sure medication is ready. **  ** Please notify patient to allow 48-72 hours to process**  **Encourage patient to contact the pharmacy for refills or they can request refills through [facility]**

## 2019-05-05 ENCOUNTER — Other Ambulatory Visit: Payer: Self-pay

## 2019-05-06 ENCOUNTER — Ambulatory Visit: Payer: PPO | Admitting: Endocrinology

## 2019-05-06 ENCOUNTER — Encounter: Payer: Self-pay | Admitting: Endocrinology

## 2019-05-06 VITALS — BP 140/82 | HR 83 | Ht 61.5 in | Wt 177.6 lb

## 2019-05-06 DIAGNOSIS — I10 Essential (primary) hypertension: Secondary | ICD-10-CM

## 2019-05-06 DIAGNOSIS — E1165 Type 2 diabetes mellitus with hyperglycemia: Secondary | ICD-10-CM | POA: Diagnosis not present

## 2019-05-06 LAB — COMPREHENSIVE METABOLIC PANEL
ALT: 13 U/L (ref 0–35)
AST: 15 U/L (ref 0–37)
Albumin: 4.1 g/dL (ref 3.5–5.2)
Alkaline Phosphatase: 48 U/L (ref 39–117)
BUN: 48 mg/dL — ABNORMAL HIGH (ref 6–23)
CO2: 25 mEq/L (ref 19–32)
Calcium: 9.7 mg/dL (ref 8.4–10.5)
Chloride: 104 mEq/L (ref 96–112)
Creatinine, Ser: 1.29 mg/dL — ABNORMAL HIGH (ref 0.40–1.20)
GFR: 48.23 mL/min — ABNORMAL LOW (ref >60.00)
Glucose, Bld: 116 mg/dL — ABNORMAL HIGH (ref 70–99)
Potassium: 5.1 mEq/L (ref 3.5–5.1)
Sodium: 138 mEq/L (ref 135–145)
Total Bilirubin: 0.2 mg/dL (ref 0.2–1.2)
Total Protein: 7.4 g/dL (ref 6.0–8.3)

## 2019-05-06 LAB — POCT GLYCOSYLATED HEMOGLOBIN (HGB A1C): Hemoglobin A1C: 6.6 % — AB (ref 4.0–5.6)

## 2019-05-06 LAB — MICROALBUMIN / CREATININE URINE RATIO
Creatinine,U: 77.7 mg/dL
Microalb Creat Ratio: 9.5 mg/g (ref 0.0–30.0)
Microalb, Ur: 7.4 mg/dL — ABNORMAL HIGH (ref 0.0–1.9)

## 2019-05-06 LAB — GLUCOSE, POCT (MANUAL RESULT ENTRY): POC Glucose: 128 mg/dl — AB (ref 70–99)
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
## 2019-05-06 NOTE — Patient Instructions (Addendum)
Check blood sugars on waking up 2 days a week  Also check blood sugars about 2 hours after meals and do this after different meals by rotation  Recommended blood sugar levels on waking up are 90-130 and about 2 hours after meal is 130-180  Please bring your blood sugar monitor to each visit, thank you  Invokana 2 of 100mg  daily but REDUCE Triamterene to 1/2 tab  Have [name] recheck potassium

## 2019-05-16 ENCOUNTER — Other Ambulatory Visit: Payer: Self-pay | Admitting: Endocrinology

## 2019-05-31 DIAGNOSIS — H5203 Hypermetropia, bilateral: Secondary | ICD-10-CM | POA: Diagnosis not present

## 2019-05-31 DIAGNOSIS — H2513 Age-related nuclear cataract, bilateral: Secondary | ICD-10-CM | POA: Diagnosis not present

## 2019-05-31 DIAGNOSIS — H40053 Ocular hypertension, bilateral: Secondary | ICD-10-CM | POA: Diagnosis not present

## 2019-05-31 DIAGNOSIS — H524 Presbyopia: Secondary | ICD-10-CM | POA: Diagnosis not present

## 2019-06-01 DIAGNOSIS — Z23 Encounter for immunization: Secondary | ICD-10-CM | POA: Diagnosis not present

## 2019-06-09 ENCOUNTER — Telehealth: Payer: Self-pay | Admitting: Endocrinology

## 2019-06-09 ENCOUNTER — Other Ambulatory Visit: Payer: Self-pay

## 2019-06-09 MED ORDER — CANAGLIFLOZIN 300 MG PO TABS: 300.0000 mg | ORAL_TABLET | Freq: Every day | ORAL | 0 refills | Status: DC

## 2019-06-09 NOTE — Telephone Encounter (Signed)
MEDICATION: invokana  PHARMACY:   [id number] - [place], [place] - [address] Phone:  [phone]  Fax:  [phone]     IS THIS A 90 DAY SUPPLY : yes  IS PATIENT OUT OF MEDICATION: yes  IF NOT; HOW MUCH IS LEFT:   LAST APPOINTMENT DATE: @3 /14/2021  NEXT APPOINTMENT DATE:@6 /10/2019  DO WE HAVE YOUR PERMISSION TO LEAVE A DETAILED MESSAGE: yes  OTHER COMMENTS: patient states she needs refill but it needs to state on RX that patient needs to take it 2x daily rather than 1x daily.   **Let patient know to contact pharmacy at the end of the day to make sure medication is ready. **  ** Please notify patient to allow 48-72 hours to process**  **Encourage patient to contact the pharmacy for refills or they can request refills through [facility]**

## 2019-06-09 NOTE — Telephone Encounter (Signed)
Rx sent 

## 2019-07-02 DIAGNOSIS — E1151 Type 2 diabetes mellitus with diabetic peripheral angiopathy without gangrene: Secondary | ICD-10-CM | POA: Diagnosis not present

## 2019-07-02 DIAGNOSIS — E7800 Pure hypercholesterolemia, unspecified: Secondary | ICD-10-CM | POA: Diagnosis not present

## 2019-07-02 DIAGNOSIS — I10 Essential (primary) hypertension: Secondary | ICD-10-CM | POA: Diagnosis not present

## 2019-07-02 DIAGNOSIS — E1169 Type 2 diabetes mellitus with other specified complication: Secondary | ICD-10-CM | POA: Diagnosis not present

## 2019-07-02 DIAGNOSIS — J449 Chronic obstructive pulmonary disease, unspecified: Secondary | ICD-10-CM | POA: Diagnosis not present

## 2019-07-02 DIAGNOSIS — E785 Hyperlipidemia, unspecified: Secondary | ICD-10-CM | POA: Diagnosis not present

## 2019-07-02 DIAGNOSIS — H409 Unspecified glaucoma: Secondary | ICD-10-CM | POA: Diagnosis not present

## 2019-07-02 DIAGNOSIS — E119 Type 2 diabetes mellitus without complications: Secondary | ICD-10-CM | POA: Diagnosis not present

## 2019-07-02 DIAGNOSIS — J441 Chronic obstructive pulmonary disease with (acute) exacerbation: Secondary | ICD-10-CM | POA: Diagnosis not present

## 2019-07-02 DIAGNOSIS — J42 Unspecified chronic bronchitis: Secondary | ICD-10-CM | POA: Diagnosis not present

## 2019-07-09 ENCOUNTER — Other Ambulatory Visit: Payer: Self-pay

## 2019-07-09 MED ORDER — CANAGLIFLOZIN 300 MG PO TABS: 300.0000 mg | ORAL_TABLET | Freq: Every day | ORAL | 0 refills | Status: DC

## 2019-07-29 DIAGNOSIS — E1169 Type 2 diabetes mellitus with other specified complication: Secondary | ICD-10-CM | POA: Diagnosis not present

## 2019-07-29 DIAGNOSIS — G4730 Sleep apnea, unspecified: Secondary | ICD-10-CM | POA: Diagnosis not present

## 2019-07-29 DIAGNOSIS — R61 Generalized hyperhidrosis: Secondary | ICD-10-CM | POA: Diagnosis not present

## 2019-07-29 DIAGNOSIS — E7800 Pure hypercholesterolemia, unspecified: Secondary | ICD-10-CM | POA: Diagnosis not present

## 2019-07-29 DIAGNOSIS — J449 Chronic obstructive pulmonary disease, unspecified: Secondary | ICD-10-CM | POA: Diagnosis not present

## 2019-07-29 DIAGNOSIS — I712 Thoracic aortic aneurysm, without rupture: Secondary | ICD-10-CM | POA: Diagnosis not present

## 2019-07-29 DIAGNOSIS — I5189 Other ill-defined heart diseases: Secondary | ICD-10-CM | POA: Diagnosis not present

## 2019-07-29 DIAGNOSIS — I10 Essential (primary) hypertension: Secondary | ICD-10-CM | POA: Diagnosis not present

## 2019-07-29 DIAGNOSIS — I739 Peripheral vascular disease, unspecified: Secondary | ICD-10-CM | POA: Diagnosis not present

## 2019-07-29 DIAGNOSIS — M8588 Other specified disorders of bone density and structure, other site: Secondary | ICD-10-CM | POA: Diagnosis not present

## 2019-07-29 LAB — LIPID PANEL: LDL Cholesterol: 50

## 2019-07-29 LAB — TSH: TSH: 1.83 (ref 0.41–5.90)

## 2019-07-29 LAB — BASIC METABOLIC PANEL: Creatinine: 1 (ref 0.5–1.1)

## 2019-08-06 ENCOUNTER — Other Ambulatory Visit: Payer: Self-pay

## 2019-08-10 ENCOUNTER — Other Ambulatory Visit: Payer: Self-pay

## 2019-08-10 ENCOUNTER — Ambulatory Visit: Payer: PPO | Admitting: Endocrinology

## 2019-08-10 ENCOUNTER — Encounter: Payer: Self-pay | Admitting: Endocrinology

## 2019-08-10 VITALS — BP 126/70 | HR 84 | Ht 61.5 in | Wt 173.4 lb

## 2019-08-10 DIAGNOSIS — E1165 Type 2 diabetes mellitus with hyperglycemia: Secondary | ICD-10-CM

## 2019-08-10 LAB — GLUCOSE, POCT (MANUAL RESULT ENTRY): POC Glucose: 134 mg/dl — AB (ref 70–99)

## 2019-08-10 LAB — POCT GLYCOSYLATED HEMOGLOBIN (HGB A1C): Hemoglobin A1C: 6.6 % — AB (ref 4.0–5.6)

## 2019-08-10 MED ORDER — GLUCOSE BLOOD VI STRP: ORAL_STRIP | 3 refills | Status: DC
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
## 2019-08-10 NOTE — Patient Instructions (Signed)
Check blood sugars on waking up 2 days a week  Also check blood sugars about 2 hours after meals and do this after different meals by rotation  Recommended blood sugar levels on waking up are 90-130 and about 2 hours after meal is 130-160  Please bring your blood sugar monitor to each visit, thank you   

## 2019-08-25 DIAGNOSIS — E785 Hyperlipidemia, unspecified: Secondary | ICD-10-CM | POA: Diagnosis not present

## 2019-08-25 DIAGNOSIS — E119 Type 2 diabetes mellitus without complications: Secondary | ICD-10-CM | POA: Diagnosis not present

## 2019-08-25 DIAGNOSIS — J42 Unspecified chronic bronchitis: Secondary | ICD-10-CM | POA: Diagnosis not present

## 2019-08-25 DIAGNOSIS — J449 Chronic obstructive pulmonary disease, unspecified: Secondary | ICD-10-CM | POA: Diagnosis not present

## 2019-08-25 DIAGNOSIS — J441 Chronic obstructive pulmonary disease with (acute) exacerbation: Secondary | ICD-10-CM | POA: Diagnosis not present

## 2019-08-25 DIAGNOSIS — I10 Essential (primary) hypertension: Secondary | ICD-10-CM | POA: Diagnosis not present

## 2019-08-25 DIAGNOSIS — E1151 Type 2 diabetes mellitus with diabetic peripheral angiopathy without gangrene: Secondary | ICD-10-CM | POA: Diagnosis not present

## 2019-08-25 DIAGNOSIS — E1169 Type 2 diabetes mellitus with other specified complication: Secondary | ICD-10-CM | POA: Diagnosis not present

## 2019-08-25 DIAGNOSIS — E7800 Pure hypercholesterolemia, unspecified: Secondary | ICD-10-CM | POA: Diagnosis not present

## 2019-08-31 ENCOUNTER — Other Ambulatory Visit: Payer: Self-pay | Admitting: Endocrinology

## 2019-09-13 DIAGNOSIS — Z780 Asymptomatic menopausal state: Secondary | ICD-10-CM | POA: Diagnosis not present

## 2019-09-13 DIAGNOSIS — M8589 Other specified disorders of bone density and structure, multiple sites: Secondary | ICD-10-CM | POA: Diagnosis not present

## 2019-09-24 DIAGNOSIS — E785 Hyperlipidemia, unspecified: Secondary | ICD-10-CM | POA: Diagnosis not present

## 2019-09-24 DIAGNOSIS — J441 Chronic obstructive pulmonary disease with (acute) exacerbation: Secondary | ICD-10-CM | POA: Diagnosis not present

## 2019-09-24 DIAGNOSIS — E7800 Pure hypercholesterolemia, unspecified: Secondary | ICD-10-CM | POA: Diagnosis not present

## 2019-09-24 DIAGNOSIS — H409 Unspecified glaucoma: Secondary | ICD-10-CM | POA: Diagnosis not present

## 2019-09-24 DIAGNOSIS — E1151 Type 2 diabetes mellitus with diabetic peripheral angiopathy without gangrene: Secondary | ICD-10-CM | POA: Diagnosis not present

## 2019-09-24 DIAGNOSIS — J42 Unspecified chronic bronchitis: Secondary | ICD-10-CM | POA: Diagnosis not present

## 2019-09-24 DIAGNOSIS — E1169 Type 2 diabetes mellitus with other specified complication: Secondary | ICD-10-CM | POA: Diagnosis not present

## 2019-09-24 DIAGNOSIS — I10 Essential (primary) hypertension: Secondary | ICD-10-CM | POA: Diagnosis not present

## 2019-09-24 DIAGNOSIS — E119 Type 2 diabetes mellitus without complications: Secondary | ICD-10-CM | POA: Diagnosis not present

## 2019-09-24 DIAGNOSIS — J449 Chronic obstructive pulmonary disease, unspecified: Secondary | ICD-10-CM | POA: Diagnosis not present

## 2019-11-02 DIAGNOSIS — Z7984 Long term (current) use of oral hypoglycemic drugs: Secondary | ICD-10-CM | POA: Diagnosis not present

## 2019-11-02 DIAGNOSIS — E7800 Pure hypercholesterolemia, unspecified: Secondary | ICD-10-CM | POA: Diagnosis not present

## 2019-11-02 DIAGNOSIS — J42 Unspecified chronic bronchitis: Secondary | ICD-10-CM | POA: Diagnosis not present

## 2019-11-02 DIAGNOSIS — H409 Unspecified glaucoma: Secondary | ICD-10-CM | POA: Diagnosis not present

## 2019-11-02 DIAGNOSIS — J449 Chronic obstructive pulmonary disease, unspecified: Secondary | ICD-10-CM | POA: Diagnosis not present

## 2019-11-02 DIAGNOSIS — I10 Essential (primary) hypertension: Secondary | ICD-10-CM | POA: Diagnosis not present

## 2019-11-02 DIAGNOSIS — E1151 Type 2 diabetes mellitus with diabetic peripheral angiopathy without gangrene: Secondary | ICD-10-CM | POA: Diagnosis not present

## 2019-11-02 DIAGNOSIS — K219 Gastro-esophageal reflux disease without esophagitis: Secondary | ICD-10-CM | POA: Diagnosis not present

## 2019-11-24 ENCOUNTER — Telehealth: Payer: Self-pay

## 2019-11-24 ENCOUNTER — Telehealth: Payer: Self-pay | Admitting: Emergency Medicine

## 2019-11-24 DIAGNOSIS — E7800 Pure hypercholesterolemia, unspecified: Secondary | ICD-10-CM | POA: Diagnosis not present

## 2019-11-24 DIAGNOSIS — E1151 Type 2 diabetes mellitus with diabetic peripheral angiopathy without gangrene: Secondary | ICD-10-CM | POA: Diagnosis not present

## 2019-11-24 DIAGNOSIS — E119 Type 2 diabetes mellitus without complications: Secondary | ICD-10-CM | POA: Diagnosis not present

## 2019-11-24 DIAGNOSIS — E1169 Type 2 diabetes mellitus with other specified complication: Secondary | ICD-10-CM | POA: Diagnosis not present

## 2019-11-24 DIAGNOSIS — K219 Gastro-esophageal reflux disease without esophagitis: Secondary | ICD-10-CM | POA: Diagnosis not present

## 2019-11-24 DIAGNOSIS — J42 Unspecified chronic bronchitis: Secondary | ICD-10-CM | POA: Diagnosis not present

## 2019-11-24 DIAGNOSIS — H409 Unspecified glaucoma: Secondary | ICD-10-CM | POA: Diagnosis not present

## 2019-11-24 DIAGNOSIS — J449 Chronic obstructive pulmonary disease, unspecified: Secondary | ICD-10-CM | POA: Diagnosis not present

## 2019-11-24 DIAGNOSIS — J441 Chronic obstructive pulmonary disease with (acute) exacerbation: Secondary | ICD-10-CM | POA: Diagnosis not present

## 2019-11-24 DIAGNOSIS — E785 Hyperlipidemia, unspecified: Secondary | ICD-10-CM | POA: Diagnosis not present

## 2019-11-24 DIAGNOSIS — I10 Essential (primary) hypertension: Secondary | ICD-10-CM | POA: Diagnosis not present

## 2019-11-24 MED ORDER — PROAIR RESPICLICK 108 (90 BASE) MCG/ACT IN AEPB: 2.0000 | INHALATION_SPRAY | RESPIRATORY_TRACT | 2 refills | Status: DC | PRN

## 2019-11-24 NOTE — Telephone Encounter (Deleted)
Error

## 2019-11-24 NOTE — Telephone Encounter (Signed)
rx refill for proair has [facility]

## 2019-11-25 ENCOUNTER — Other Ambulatory Visit: Payer: Self-pay | Admitting: Endocrinology

## 2019-12-03 DIAGNOSIS — H2513 Age-related nuclear cataract, bilateral: Secondary | ICD-10-CM | POA: Diagnosis not present

## 2019-12-03 DIAGNOSIS — H40053 Ocular hypertension, bilateral: Secondary | ICD-10-CM | POA: Diagnosis not present

## 2019-12-14 ENCOUNTER — Encounter: Payer: Self-pay | Admitting: Endocrinology

## 2019-12-14 ENCOUNTER — Ambulatory Visit: Payer: PPO | Admitting: Endocrinology

## 2019-12-14 ENCOUNTER — Other Ambulatory Visit: Payer: Self-pay

## 2019-12-14 VITALS — BP 138/88 | HR 95 | Wt 177.2 lb

## 2019-12-14 DIAGNOSIS — E1169 Type 2 diabetes mellitus with other specified complication: Secondary | ICD-10-CM

## 2019-12-14 DIAGNOSIS — E669 Obesity, unspecified: Secondary | ICD-10-CM | POA: Diagnosis not present

## 2019-12-14 LAB — POCT GLYCOSYLATED HEMOGLOBIN (HGB A1C): Hemoglobin A1C: 6.5 % — AB (ref 4.0–5.6)
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
## 2019-12-31 DIAGNOSIS — H409 Unspecified glaucoma: Secondary | ICD-10-CM | POA: Diagnosis not present

## 2019-12-31 DIAGNOSIS — E1169 Type 2 diabetes mellitus with other specified complication: Secondary | ICD-10-CM | POA: Diagnosis not present

## 2019-12-31 DIAGNOSIS — I10 Essential (primary) hypertension: Secondary | ICD-10-CM | POA: Diagnosis not present

## 2019-12-31 DIAGNOSIS — J42 Unspecified chronic bronchitis: Secondary | ICD-10-CM | POA: Diagnosis not present

## 2019-12-31 DIAGNOSIS — E785 Hyperlipidemia, unspecified: Secondary | ICD-10-CM | POA: Diagnosis not present

## 2019-12-31 DIAGNOSIS — K219 Gastro-esophageal reflux disease without esophagitis: Secondary | ICD-10-CM | POA: Diagnosis not present

## 2019-12-31 DIAGNOSIS — J441 Chronic obstructive pulmonary disease with (acute) exacerbation: Secondary | ICD-10-CM | POA: Diagnosis not present

## 2019-12-31 DIAGNOSIS — E1151 Type 2 diabetes mellitus with diabetic peripheral angiopathy without gangrene: Secondary | ICD-10-CM | POA: Diagnosis not present

## 2019-12-31 DIAGNOSIS — J449 Chronic obstructive pulmonary disease, unspecified: Secondary | ICD-10-CM | POA: Diagnosis not present

## 2019-12-31 DIAGNOSIS — E7800 Pure hypercholesterolemia, unspecified: Secondary | ICD-10-CM | POA: Diagnosis not present

## 2019-12-31 DIAGNOSIS — E119 Type 2 diabetes mellitus without complications: Secondary | ICD-10-CM | POA: Diagnosis not present

## 2020-01-03 DIAGNOSIS — I10 Essential (primary) hypertension: Secondary | ICD-10-CM | POA: Diagnosis not present

## 2020-01-03 DIAGNOSIS — Z23 Encounter for immunization: Secondary | ICD-10-CM | POA: Diagnosis not present

## 2020-01-03 DIAGNOSIS — J449 Chronic obstructive pulmonary disease, unspecified: Secondary | ICD-10-CM | POA: Diagnosis not present

## 2020-01-03 DIAGNOSIS — E7800 Pure hypercholesterolemia, unspecified: Secondary | ICD-10-CM | POA: Diagnosis not present

## 2020-01-03 DIAGNOSIS — G4730 Sleep apnea, unspecified: Secondary | ICD-10-CM | POA: Diagnosis not present

## 2020-01-03 DIAGNOSIS — I5189 Other ill-defined heart diseases: Secondary | ICD-10-CM | POA: Diagnosis not present

## 2020-01-03 DIAGNOSIS — H409 Unspecified glaucoma: Secondary | ICD-10-CM | POA: Diagnosis not present

## 2020-01-03 DIAGNOSIS — Z7984 Long term (current) use of oral hypoglycemic drugs: Secondary | ICD-10-CM | POA: Diagnosis not present

## 2020-01-03 DIAGNOSIS — I712 Thoracic aortic aneurysm, without rupture: Secondary | ICD-10-CM | POA: Diagnosis not present

## 2020-01-03 DIAGNOSIS — E1169 Type 2 diabetes mellitus with other specified complication: Secondary | ICD-10-CM | POA: Diagnosis not present

## 2020-01-03 DIAGNOSIS — Z1389 Encounter for screening for other disorder: Secondary | ICD-10-CM | POA: Diagnosis not present

## 2020-01-03 DIAGNOSIS — Z0000 Encounter for general adult medical examination without abnormal findings: Secondary | ICD-10-CM | POA: Diagnosis not present

## 2020-01-11 DIAGNOSIS — Z1231 Encounter for screening mammogram for malignant neoplasm of breast: Secondary | ICD-10-CM | POA: Diagnosis not present

## 2020-01-14 DIAGNOSIS — Z1211 Encounter for screening for malignant neoplasm of colon: Secondary | ICD-10-CM | POA: Diagnosis not present

## 2020-01-14 DIAGNOSIS — K5904 Chronic idiopathic constipation: Secondary | ICD-10-CM | POA: Diagnosis not present

## 2020-01-18 ENCOUNTER — Ambulatory Visit: Payer: PPO | Attending: Internal Medicine

## 2020-01-18 ENCOUNTER — Other Ambulatory Visit (HOSPITAL_BASED_OUTPATIENT_CLINIC_OR_DEPARTMENT_OTHER): Payer: Self-pay | Admitting: Internal Medicine

## 2020-01-18 DIAGNOSIS — E1151 Type 2 diabetes mellitus with diabetic peripheral angiopathy without gangrene: Secondary | ICD-10-CM | POA: Diagnosis not present

## 2020-01-18 DIAGNOSIS — J42 Unspecified chronic bronchitis: Secondary | ICD-10-CM | POA: Diagnosis not present

## 2020-01-18 DIAGNOSIS — E785 Hyperlipidemia, unspecified: Secondary | ICD-10-CM | POA: Diagnosis not present

## 2020-01-18 DIAGNOSIS — I10 Essential (primary) hypertension: Secondary | ICD-10-CM | POA: Diagnosis not present

## 2020-01-18 DIAGNOSIS — Z23 Encounter for immunization: Secondary | ICD-10-CM

## 2020-01-18 DIAGNOSIS — J441 Chronic obstructive pulmonary disease with (acute) exacerbation: Secondary | ICD-10-CM | POA: Diagnosis not present

## 2020-01-18 DIAGNOSIS — K219 Gastro-esophageal reflux disease without esophagitis: Secondary | ICD-10-CM | POA: Diagnosis not present

## 2020-01-18 DIAGNOSIS — H409 Unspecified glaucoma: Secondary | ICD-10-CM | POA: Diagnosis not present

## 2020-01-18 DIAGNOSIS — E1169 Type 2 diabetes mellitus with other specified complication: Secondary | ICD-10-CM | POA: Diagnosis not present

## 2020-01-18 MED FILL — MODERNA COVID-19 VACCINE 10: 100 | 1 days supply | Qty: 0 | Fill #0

## 2020-01-18 NOTE — Progress Notes (Signed)
° °  Covid-19 Vaccination Clinic  Name:  Angel Tanner    MRN: [id number] DOB: 11/08/1940  01/18/2020  [name] was observed post Covid-19 immunization for 15 minutes without incident. She was provided with Vaccine Information Sheet and instruction to access the V-Safe system.   [name] was instructed to call 911 with any severe reactions post vaccine:  Difficulty breathing   Swelling of face and throat   A fast heartbeat   A bad rash all over body   Dizziness and weakness   Immunizations Administered    No immunizations on file.

## 2020-02-15 ENCOUNTER — Other Ambulatory Visit: Payer: Self-pay | Admitting: *Deleted

## 2020-02-15 ENCOUNTER — Telehealth: Payer: Self-pay | Admitting: Endocrinology

## 2020-02-15 MED ORDER — METFORMIN HCL 850 MG PO TABS: ORAL_TABLET | ORAL | 1 refills | Status: DC

## 2020-02-15 NOTE — Telephone Encounter (Signed)
[facility] called to get a refill for patient's Metformin sent in to them for refill.

## 2020-02-15 NOTE — Telephone Encounter (Signed)
Rx has been sent  

## 2020-02-22 DIAGNOSIS — I10 Essential (primary) hypertension: Secondary | ICD-10-CM | POA: Diagnosis not present

## 2020-02-22 DIAGNOSIS — E1169 Type 2 diabetes mellitus with other specified complication: Secondary | ICD-10-CM | POA: Diagnosis not present

## 2020-02-22 DIAGNOSIS — E1151 Type 2 diabetes mellitus with diabetic peripheral angiopathy without gangrene: Secondary | ICD-10-CM | POA: Diagnosis not present

## 2020-02-22 DIAGNOSIS — K219 Gastro-esophageal reflux disease without esophagitis: Secondary | ICD-10-CM | POA: Diagnosis not present

## 2020-02-22 DIAGNOSIS — J449 Chronic obstructive pulmonary disease, unspecified: Secondary | ICD-10-CM | POA: Diagnosis not present

## 2020-02-22 DIAGNOSIS — J441 Chronic obstructive pulmonary disease with (acute) exacerbation: Secondary | ICD-10-CM | POA: Diagnosis not present

## 2020-02-22 DIAGNOSIS — E785 Hyperlipidemia, unspecified: Secondary | ICD-10-CM | POA: Diagnosis not present

## 2020-02-22 DIAGNOSIS — H409 Unspecified glaucoma: Secondary | ICD-10-CM | POA: Diagnosis not present

## 2020-02-22 DIAGNOSIS — J42 Unspecified chronic bronchitis: Secondary | ICD-10-CM | POA: Diagnosis not present

## 2020-02-22 DIAGNOSIS — E119 Type 2 diabetes mellitus without complications: Secondary | ICD-10-CM | POA: Diagnosis not present

## 2020-02-22 DIAGNOSIS — E7800 Pure hypercholesterolemia, unspecified: Secondary | ICD-10-CM | POA: Diagnosis not present

## 2020-03-13 DIAGNOSIS — E7800 Pure hypercholesterolemia, unspecified: Secondary | ICD-10-CM | POA: Diagnosis not present

## 2020-03-13 DIAGNOSIS — I10 Essential (primary) hypertension: Secondary | ICD-10-CM | POA: Diagnosis not present

## 2020-03-13 DIAGNOSIS — J449 Chronic obstructive pulmonary disease, unspecified: Secondary | ICD-10-CM | POA: Diagnosis not present

## 2020-03-13 DIAGNOSIS — E119 Type 2 diabetes mellitus without complications: Secondary | ICD-10-CM | POA: Diagnosis not present

## 2020-03-13 DIAGNOSIS — Z1159 Encounter for screening for other viral diseases: Secondary | ICD-10-CM | POA: Diagnosis not present

## 2020-03-13 DIAGNOSIS — E785 Hyperlipidemia, unspecified: Secondary | ICD-10-CM | POA: Diagnosis not present

## 2020-03-13 DIAGNOSIS — E1169 Type 2 diabetes mellitus with other specified complication: Secondary | ICD-10-CM | POA: Diagnosis not present

## 2020-03-13 DIAGNOSIS — J441 Chronic obstructive pulmonary disease with (acute) exacerbation: Secondary | ICD-10-CM | POA: Diagnosis not present

## 2020-03-13 DIAGNOSIS — E1151 Type 2 diabetes mellitus with diabetic peripheral angiopathy without gangrene: Secondary | ICD-10-CM | POA: Diagnosis not present

## 2020-03-13 DIAGNOSIS — K219 Gastro-esophageal reflux disease without esophagitis: Secondary | ICD-10-CM | POA: Diagnosis not present

## 2020-03-13 DIAGNOSIS — J42 Unspecified chronic bronchitis: Secondary | ICD-10-CM | POA: Diagnosis not present

## 2020-03-13 DIAGNOSIS — H409 Unspecified glaucoma: Secondary | ICD-10-CM | POA: Diagnosis not present

## 2020-03-16 DIAGNOSIS — K640 First degree hemorrhoids: Secondary | ICD-10-CM | POA: Diagnosis not present

## 2020-03-16 DIAGNOSIS — Z1211 Encounter for screening for malignant neoplasm of colon: Secondary | ICD-10-CM | POA: Diagnosis not present

## 2020-04-18 ENCOUNTER — Ambulatory Visit: Payer: PPO | Admitting: Endocrinology

## 2020-04-26 ENCOUNTER — Encounter: Payer: Self-pay | Admitting: Endocrinology

## 2020-04-26 ENCOUNTER — Other Ambulatory Visit: Payer: Self-pay

## 2020-04-26 ENCOUNTER — Ambulatory Visit: Payer: PPO | Admitting: Endocrinology

## 2020-04-26 VITALS — BP 128/82 | HR 91 | Ht 61.5 in | Wt 174.4 lb

## 2020-04-26 DIAGNOSIS — E669 Obesity, unspecified: Secondary | ICD-10-CM | POA: Diagnosis not present

## 2020-04-26 DIAGNOSIS — E1169 Type 2 diabetes mellitus with other specified complication: Secondary | ICD-10-CM

## 2020-04-26 LAB — COMPREHENSIVE METABOLIC PANEL
ALT: 8 U/L (ref 0–35)
AST: 12 U/L (ref 0–37)
Albumin: 4.3 g/dL (ref 3.5–5.2)
Alkaline Phosphatase: 33 U/L — ABNORMAL LOW (ref 39–117)
BUN: 33 mg/dL — ABNORMAL HIGH (ref 6–23)
CO2: 30 mEq/L (ref 19–32)
Calcium: 9.8 mg/dL (ref 8.4–10.5)
Chloride: 104 mEq/L (ref 96–112)
Creatinine, Ser: 1.44 mg/dL — ABNORMAL HIGH (ref 0.40–1.20)
GFR: 34.46 mL/min — ABNORMAL LOW (ref >60.00)
Glucose, Bld: 156 mg/dL — ABNORMAL HIGH (ref 70–99)
Potassium: 4.3 mEq/L (ref 3.5–5.1)
Sodium: 141 mEq/L (ref 135–145)
Total Bilirubin: 0.3 mg/dL (ref 0.2–1.2)
Total Protein: 7.7 g/dL (ref 6.0–8.3)

## 2020-04-26 LAB — POCT GLYCOSYLATED HEMOGLOBIN (HGB A1C): Hemoglobin A1C: 6.7 % — AB (ref 4.0–5.6)

## 2020-04-26 LAB — MICROALBUMIN / CREATININE URINE RATIO
Creatinine,U: 61.6 mg/dL
Microalb Creat Ratio: 3.3 mg/g (ref 0.0–30.0)
Microalb, Ur: 2 mg/dL — ABNORMAL HIGH (ref 0.0–1.9)
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
## 2020-05-01 ENCOUNTER — Ambulatory Visit: Payer: PPO | Admitting: Podiatry

## 2020-05-01 ENCOUNTER — Encounter: Payer: Self-pay | Admitting: Podiatry

## 2020-05-01 ENCOUNTER — Other Ambulatory Visit: Payer: Self-pay

## 2020-05-01 DIAGNOSIS — E119 Type 2 diabetes mellitus without complications: Secondary | ICD-10-CM | POA: Diagnosis not present

## 2020-05-01 DIAGNOSIS — I10 Essential (primary) hypertension: Secondary | ICD-10-CM | POA: Diagnosis not present

## 2020-05-01 DIAGNOSIS — J441 Chronic obstructive pulmonary disease with (acute) exacerbation: Secondary | ICD-10-CM | POA: Diagnosis not present

## 2020-05-01 DIAGNOSIS — E7800 Pure hypercholesterolemia, unspecified: Secondary | ICD-10-CM | POA: Diagnosis not present

## 2020-05-01 DIAGNOSIS — E1151 Type 2 diabetes mellitus with diabetic peripheral angiopathy without gangrene: Secondary | ICD-10-CM | POA: Diagnosis not present

## 2020-05-01 DIAGNOSIS — E785 Hyperlipidemia, unspecified: Secondary | ICD-10-CM | POA: Diagnosis not present

## 2020-05-01 DIAGNOSIS — M205X2 Other deformities of toe(s) (acquired), left foot: Secondary | ICD-10-CM

## 2020-05-01 DIAGNOSIS — J449 Chronic obstructive pulmonary disease, unspecified: Secondary | ICD-10-CM | POA: Diagnosis not present

## 2020-05-01 DIAGNOSIS — H409 Unspecified glaucoma: Secondary | ICD-10-CM | POA: Diagnosis not present

## 2020-05-01 DIAGNOSIS — K219 Gastro-esophageal reflux disease without esophagitis: Secondary | ICD-10-CM | POA: Diagnosis not present

## 2020-05-01 NOTE — Progress Notes (Signed)
This patient presents to the office with chief complaint of  diabetic feet.  .   Patient has no history of infection or drainage from both feet.     This patient presents  to the office today for  a foot evaluation due to history of  Diabetes. Patient was seen 4 months ago.  General Appearance  Alert, conversant and in no acute stress.  Vascular  Dorsalis pedis  are palpable  bilaterally. Posterior tibial pulses are absent  B/L. Capillary return is within normal limits  bilaterally. Temperature is within normal limits  bilaterally.  Neurologic  Senn-Weinstein monofilament wire test within normal limits  bilaterally. Muscle power within normal limits bilaterally.  Nails Thick disfigured discolored nails with subungual debris  from hallux to fifth toes bilaterally.  No pain or discomfort.. No evidence of bacterial infection or drainage bilaterally.  Orthopedic  No limitations of motion of motion feet .  No crepitus or effusions noted.  No bony pathology or digital deformities noted. S/P bunion/5th toes surgery  B/L.  Mallet toe 4th left.HAV 1st MPJ right foot is regrowing.  Skin  normotropic skin with no porokeratosis noted bilaterally.  No signs of infections or ulcers noted.  Corn  DIPJ left foot.   Diabetes with no foot complications  ROV  A diabetic foot exam was performed and there is no evidence of any vascular or neurologic pathology. Discussed findings with this patient.    RTC 1 year.   Gardiner Barefoot DPM

## 2020-05-02 NOTE — Progress Notes (Signed)
Her kidney blood test is slightly worse, she needs to discuss with PCP, labs have been faxed to Dr. Delfina Redwood.  May need to reduce her lisinopril

## 2020-05-04 ENCOUNTER — Encounter: Payer: Self-pay | Admitting: Emergency Medicine

## 2020-05-04 ENCOUNTER — Ambulatory Visit: Payer: PPO | Admitting: Emergency Medicine

## 2020-05-04 ENCOUNTER — Other Ambulatory Visit: Payer: Self-pay

## 2020-05-04 VITALS — BP 116/80 | HR 87 | Temp 97.5°F | Ht 61.5 in | Wt 174.8 lb

## 2020-05-04 DIAGNOSIS — J9811 Atelectasis: Secondary | ICD-10-CM | POA: Diagnosis not present

## 2020-05-04 DIAGNOSIS — R918 Other nonspecific abnormal finding of lung field: Secondary | ICD-10-CM | POA: Diagnosis not present

## 2020-05-04 DIAGNOSIS — G4733 Obstructive sleep apnea (adult) (pediatric): Secondary | ICD-10-CM | POA: Diagnosis not present

## 2020-05-04 DIAGNOSIS — J449 Chronic obstructive pulmonary disease, unspecified: Secondary | ICD-10-CM | POA: Diagnosis not present

## 2020-05-04 NOTE — Progress Notes (Signed)
Subjective:    Patient ID: Doyne Keel, female    DOB: 09-Sep-1940, 80y.o.   MRN: [id number]  HPI     ROV 04/24/2018 --Ms. Rasnick follows up today.  She is 63, has a history of COPD, benign pulmonary nodular disease that we followed previously with CT scans of the chest, obstructive sleep apnea on BiPAP.  She is intermittently had blood-tinged mucus, improved at least initially with increasing the humidity in her BiPAP.  It recurred earlier this month.  I performed bronchoscopy on 04/14/2018 that showed significant posterior pharyngeal and periglottic edema but no clear bleeding source.  There was some hypopigmented right middle lobe airway mucosa so that was brushed and was benign.  Bacterial culture shows normal flora, AFB and fungal smears negative, cultures pending.  She has daily mild GERD sx on omeprazole prn, on loratadine qd. Minimal cough, remains on lisinopril. She stopped her BiPAP about 2 months ago due to discomfort, waking her up. She is planning to go to [facility] to see if there are other mask options.   ROV 09/22/2018 --follow-up visit for 80 year old woman with moderate COPD, pulmonary nodular disease which we have followed with serial CT scans, obstructive sleep apnea for which she was previously on BiPAP but has not been able to tolerate for several months.  We performed bronchoscopy 04/14/2018 that was unrevealing.  She is not currently on any bronchodilator therapy.  She was referred to ENT Dr. Redmond Baseman for otalgia.  Her blood-tinged sputum was improved and so a laryngoscopy was deferred. She believes it may be dental. She has albuterol, believes it may help her some, uses almost once a day. Walks slowly, has trouble with stairs.   ROV 05/04/20 --annual follow-up visit for 80 year old woman with a history of COPD, obstructive sleep apnea previously on BiPAP but not currently, pulmonary nodular disease that we have followed with serial CT scans, negative bronchoscopy 04/14/2018.  Last seen  03/24/2019.  She is not on any scheduled bronchodilator therapy. Uses albuterol a couple times a month - usually for wheezing, good relief. Good functional capacity. Very little cough.   Most recent CT chest 03/31/2019 (following TAA) reviewed by me, showed no mediastinal adenopathy, 3 mm anterior right upper lobe nodule (stable), stable 8 mm subpleural posterior right lower lobe nodule.  There is an area of interval right middle lobe volume loss not seen on prior.  MDM: Reviewed thoracic surgery note from 03/31/2019 Reviewed internal medicine note from 04/26/2020      Objective:   Physical Exam Vitals:   05/04/20 1049  BP: 116/80  Pulse: 87  Temp: (!) 97.5 F (36.4 C)  TempSrc: Temporal  SpO2: 97%  Weight: 174 lb 12.8 oz (79.3 kg)  Height: 5' 1.5" (1.562 m)   Gen: Pleasant, obese woman, in no distress,  normal affect  ENT: No lesions,  mouth clear,  oropharynx clear, no postnasal drip  Neck: No JVD, no  stridor  Lungs: No use of accessory muscles, small volumes, no wheeze or crackles.   Cardiovascular: RRR, heart sounds normal, no murmur or gallops, no peripheral edema  Musculoskeletal: No deformities, no cyanosis or clubbing  Neuro: alert, non focal  Skin: Warm, no lesions or rashes       Assessment & Plan:   COPD (chronic obstructive pulmonary disease) ([facility]) Continues to have good functional capacity.  No exacerbations.  Using albuterol rarely but has it available.  We will hold off on scheduled bronchodilator therapy for now.  Continue to follow.  COVID-19 vaccine is up-to-date  Obstructive sleep apnea No longer using BiPAP.  Will consider repeat PSG going forward  Pulmonary nodules Her pulmonary nodules have been stable and her bronchoscopy was unrevealing.  That being said she has new right middle lobe volume loss unclear etiology.  She needs a repeat CT both to evaluate this and also follow her TAA.  We will arrange for this.    Baltazar Apo, MD, PhD 05/04/2020,  11:21 AM [facility] [phone] or if no answer [phone]

## 2020-05-04 NOTE — Addendum Note (Signed)
Addended by: Gavin Potters R on: 05/04/2020 11:38 AM   Modules accepted: Orders

## 2020-05-04 NOTE — Assessment & Plan Note (Signed)
Continues to have good functional capacity.  No exacerbations.  Using albuterol rarely but has it available.  We will hold off on scheduled bronchodilator therapy for now.  Continue to follow.  COVID-19 vaccine is up-to-date

## 2020-05-04 NOTE — Assessment & Plan Note (Signed)
Her pulmonary nodules have been stable and her bronchoscopy was unrevealing.  That being said she has new right middle lobe volume loss unclear etiology.  She needs a repeat CT both to evaluate this and also follow her TAA.  We will arrange for this.

## 2020-05-04 NOTE — Assessment & Plan Note (Signed)
No longer using BiPAP.  Will consider repeat PSG going forward

## 2020-05-04 NOTE — Patient Instructions (Signed)
We will obtain a repeat CT scan of her chest to evaluate your thoracic aortic aneurysm, right middle lobe and your pulmonary nodules. Keep your albuterol available use 2 puffs when you need if shortness of breath, chest tightness, wheezing. We will not start a scheduled inhaler medication at this time. Follow Dr. Lamonte Sakai next available after your CT scan so that we can review the results together

## 2020-05-15 DIAGNOSIS — N183 Chronic kidney disease, stage 3 unspecified: Secondary | ICD-10-CM | POA: Diagnosis not present

## 2020-05-17 ENCOUNTER — Other Ambulatory Visit: Payer: Self-pay

## 2020-05-17 ENCOUNTER — Ambulatory Visit
Admission: RE | Admit: 2020-05-17 | Discharge: 2020-05-17 | Disposition: A | Discharge status: Home / Self Care | Payer: PPO | Source: Ambulatory Visit | Attending: Emergency Medicine | Admitting: Emergency Medicine

## 2020-05-17 DIAGNOSIS — J432 Centrilobular emphysema: Secondary | ICD-10-CM | POA: Diagnosis not present

## 2020-05-17 DIAGNOSIS — J9811 Atelectasis: Secondary | ICD-10-CM | POA: Insufficient documentation

## 2020-05-17 DIAGNOSIS — I712 Thoracic aortic aneurysm, without rupture: Secondary | ICD-10-CM | POA: Diagnosis not present

## 2020-05-17 DIAGNOSIS — I2510 Atherosclerotic heart disease of native coronary artery without angina pectoris: Secondary | ICD-10-CM | POA: Diagnosis not present

## 2020-05-18 DIAGNOSIS — E785 Hyperlipidemia, unspecified: Secondary | ICD-10-CM | POA: Diagnosis not present

## 2020-05-18 DIAGNOSIS — E1169 Type 2 diabetes mellitus with other specified complication: Secondary | ICD-10-CM | POA: Diagnosis not present

## 2020-05-18 DIAGNOSIS — K219 Gastro-esophageal reflux disease without esophagitis: Secondary | ICD-10-CM | POA: Diagnosis not present

## 2020-05-18 DIAGNOSIS — I10 Essential (primary) hypertension: Secondary | ICD-10-CM | POA: Diagnosis not present

## 2020-05-18 DIAGNOSIS — J441 Chronic obstructive pulmonary disease with (acute) exacerbation: Secondary | ICD-10-CM | POA: Diagnosis not present

## 2020-05-18 DIAGNOSIS — N1831 Chronic kidney disease, stage 3a: Secondary | ICD-10-CM | POA: Diagnosis not present

## 2020-05-18 DIAGNOSIS — H409 Unspecified glaucoma: Secondary | ICD-10-CM | POA: Diagnosis not present

## 2020-05-18 DIAGNOSIS — J42 Unspecified chronic bronchitis: Secondary | ICD-10-CM | POA: Diagnosis not present

## 2020-05-18 DIAGNOSIS — E1151 Type 2 diabetes mellitus with diabetic peripheral angiopathy without gangrene: Secondary | ICD-10-CM | POA: Diagnosis not present

## 2020-05-18 DIAGNOSIS — J449 Chronic obstructive pulmonary disease, unspecified: Secondary | ICD-10-CM | POA: Diagnosis not present

## 2020-05-23 ENCOUNTER — Other Ambulatory Visit: Payer: Self-pay | Admitting: Endocrinology

## 2020-05-29 DIAGNOSIS — N1831 Chronic kidney disease, stage 3a: Secondary | ICD-10-CM | POA: Diagnosis not present

## 2020-05-29 DIAGNOSIS — I10 Essential (primary) hypertension: Secondary | ICD-10-CM | POA: Diagnosis not present

## 2020-05-29 DIAGNOSIS — E7800 Pure hypercholesterolemia, unspecified: Secondary | ICD-10-CM | POA: Diagnosis not present

## 2020-06-01 DIAGNOSIS — I10 Essential (primary) hypertension: Secondary | ICD-10-CM | POA: Diagnosis not present

## 2020-06-06 DIAGNOSIS — H40053 Ocular hypertension, bilateral: Secondary | ICD-10-CM | POA: Diagnosis not present

## 2020-06-06 DIAGNOSIS — H2513 Age-related nuclear cataract, bilateral: Secondary | ICD-10-CM | POA: Diagnosis not present

## 2020-06-06 DIAGNOSIS — H04123 Dry eye syndrome of bilateral lacrimal glands: Secondary | ICD-10-CM | POA: Diagnosis not present

## 2020-06-06 DIAGNOSIS — H5203 Hypermetropia, bilateral: Secondary | ICD-10-CM | POA: Diagnosis not present

## 2020-07-05 ENCOUNTER — Ambulatory Visit: Payer: PPO | Admitting: Emergency Medicine

## 2020-07-07 ENCOUNTER — Ambulatory Visit: Payer: PPO | Admitting: Emergency Medicine

## 2020-07-24 DIAGNOSIS — E1151 Type 2 diabetes mellitus with diabetic peripheral angiopathy without gangrene: Secondary | ICD-10-CM | POA: Diagnosis not present

## 2020-07-24 DIAGNOSIS — E1169 Type 2 diabetes mellitus with other specified complication: Secondary | ICD-10-CM | POA: Diagnosis not present

## 2020-07-24 DIAGNOSIS — N1831 Chronic kidney disease, stage 3a: Secondary | ICD-10-CM | POA: Diagnosis not present

## 2020-07-24 DIAGNOSIS — J42 Unspecified chronic bronchitis: Secondary | ICD-10-CM | POA: Diagnosis not present

## 2020-07-24 DIAGNOSIS — N183 Chronic kidney disease, stage 3 unspecified: Secondary | ICD-10-CM | POA: Diagnosis not present

## 2020-07-24 DIAGNOSIS — E785 Hyperlipidemia, unspecified: Secondary | ICD-10-CM | POA: Diagnosis not present

## 2020-07-24 DIAGNOSIS — J441 Chronic obstructive pulmonary disease with (acute) exacerbation: Secondary | ICD-10-CM | POA: Diagnosis not present

## 2020-07-24 DIAGNOSIS — K219 Gastro-esophageal reflux disease without esophagitis: Secondary | ICD-10-CM | POA: Diagnosis not present

## 2020-07-24 DIAGNOSIS — E119 Type 2 diabetes mellitus without complications: Secondary | ICD-10-CM | POA: Diagnosis not present

## 2020-07-24 DIAGNOSIS — I10 Essential (primary) hypertension: Secondary | ICD-10-CM | POA: Diagnosis not present

## 2020-07-24 DIAGNOSIS — J449 Chronic obstructive pulmonary disease, unspecified: Secondary | ICD-10-CM | POA: Diagnosis not present

## 2020-08-02 DIAGNOSIS — N1831 Chronic kidney disease, stage 3a: Secondary | ICD-10-CM | POA: Diagnosis not present

## 2020-08-10 ENCOUNTER — Other Ambulatory Visit: Payer: Self-pay | Admitting: *Deleted

## 2020-08-10 DIAGNOSIS — R911 Solitary pulmonary nodule: Secondary | ICD-10-CM

## 2020-08-10 DIAGNOSIS — I712 Thoracic aortic aneurysm, without rupture, unspecified: Secondary | ICD-10-CM

## 2020-08-11 ENCOUNTER — Other Ambulatory Visit: Payer: Self-pay | Admitting: Endocrinology

## 2020-08-23 DIAGNOSIS — J441 Chronic obstructive pulmonary disease with (acute) exacerbation: Secondary | ICD-10-CM | POA: Diagnosis not present

## 2020-08-23 DIAGNOSIS — J42 Unspecified chronic bronchitis: Secondary | ICD-10-CM | POA: Diagnosis not present

## 2020-08-23 DIAGNOSIS — J449 Chronic obstructive pulmonary disease, unspecified: Secondary | ICD-10-CM | POA: Diagnosis not present

## 2020-08-23 DIAGNOSIS — E1169 Type 2 diabetes mellitus with other specified complication: Secondary | ICD-10-CM | POA: Diagnosis not present

## 2020-08-23 DIAGNOSIS — H409 Unspecified glaucoma: Secondary | ICD-10-CM | POA: Diagnosis not present

## 2020-08-23 DIAGNOSIS — K219 Gastro-esophageal reflux disease without esophagitis: Secondary | ICD-10-CM | POA: Diagnosis not present

## 2020-08-23 DIAGNOSIS — E785 Hyperlipidemia, unspecified: Secondary | ICD-10-CM | POA: Diagnosis not present

## 2020-08-23 DIAGNOSIS — N1831 Chronic kidney disease, stage 3a: Secondary | ICD-10-CM | POA: Diagnosis not present

## 2020-08-23 DIAGNOSIS — I10 Essential (primary) hypertension: Secondary | ICD-10-CM | POA: Diagnosis not present

## 2020-08-23 DIAGNOSIS — E1151 Type 2 diabetes mellitus with diabetic peripheral angiopathy without gangrene: Secondary | ICD-10-CM | POA: Diagnosis not present

## 2020-08-24 ENCOUNTER — Other Ambulatory Visit: Payer: Self-pay

## 2020-08-24 ENCOUNTER — Ambulatory Visit (INDEPENDENT_AMBULATORY_CARE_PROVIDER_SITE_OTHER): Payer: PPO | Admitting: Endocrinology

## 2020-08-24 ENCOUNTER — Encounter: Payer: Self-pay | Admitting: Endocrinology

## 2020-08-24 VITALS — BP 118/70 | HR 94 | Ht 61.5 in | Wt 174.8 lb

## 2020-08-24 DIAGNOSIS — E1165 Type 2 diabetes mellitus with hyperglycemia: Secondary | ICD-10-CM | POA: Diagnosis not present

## 2020-08-24 LAB — POCT GLYCOSYLATED HEMOGLOBIN (HGB A1C): Hemoglobin A1C: 6.7 % — AB (ref 4.0–5.6)
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
## 2020-08-24 NOTE — Patient Instructions (Signed)
Indoor video exercise routine

## 2020-09-11 ENCOUNTER — Ambulatory Visit: Payer: PPO | Admitting: Podiatry

## 2020-09-11 ENCOUNTER — Ambulatory Visit (INDEPENDENT_AMBULATORY_CARE_PROVIDER_SITE_OTHER): Payer: PPO

## 2020-09-11 ENCOUNTER — Other Ambulatory Visit: Payer: Self-pay

## 2020-09-11 ENCOUNTER — Encounter: Payer: Self-pay | Admitting: Podiatry

## 2020-09-11 DIAGNOSIS — M205X2 Other deformities of toe(s) (acquired), left foot: Secondary | ICD-10-CM | POA: Diagnosis not present

## 2020-09-11 DIAGNOSIS — S92502A Displaced unspecified fracture of left lesser toe(s), initial encounter for closed fracture: Secondary | ICD-10-CM

## 2020-09-11 NOTE — Progress Notes (Signed)
  Subjective:  Patient ID: Angel Tanner, female    DOB: 1940/03/05,  MRN: [id number]  Chief Complaint  Patient presents with   Nail Problem     I HURT THE TOE BESIDE MY BIG TOE, IT'S SWOLLEN LT    80 y.o. female presents with the above complaint. History confirmed with patient.  Says she fell and stubbed the left foot possibly a few weeks ago  Objective:  Physical Exam: warm, good capillary refill, no trophic changes or ulcerative lesions, normal DP and PT pulses, and normal sensory exam. Left Foot: She has pain with range of motion and palpation of the middle phalanx and DIPJ and PIPJ of the second toe, it is edematous, none at the MTPJ  Radiographs: Multiple views x-ray of left feet: Transverse compression fracture of the middle phalanx second toe Assessment:   1. Closed fracture of phalanx of left second toe, initial encounter      Plan:  Patient was evaluated and treated and all questions answered.  Reviewed treatment options for her toe fracture with her.  Discussed that it would likely be sore and swollen for some time.  I demonstrated buddy taping for her showed her how to do this.  Return as needed.  Shoe gear as tolerated, likely more comfortable in open shoes this point.  Return if symptoms worsen or fail to improve.

## 2020-09-26 DIAGNOSIS — I10 Essential (primary) hypertension: Secondary | ICD-10-CM | POA: Diagnosis not present

## 2020-09-29 ENCOUNTER — Other Ambulatory Visit: Payer: Self-pay

## 2020-09-29 ENCOUNTER — Ambulatory Visit
Admission: RE | Admit: 2020-09-29 | Discharge: 2020-09-29 | Disposition: A | Discharge status: Home / Self Care | Payer: PPO | Source: Ambulatory Visit | Attending: Thoracic Surgery (Cardiothoracic Vascular Surgery) | Admitting: Thoracic Surgery (Cardiothoracic Vascular Surgery)

## 2020-09-29 ENCOUNTER — Ambulatory Visit: Payer: PPO | Admitting: Physician Assistant

## 2020-09-29 VITALS — BP 119/67 | HR 76 | Resp 20 | Ht 61.5 in | Wt 173.0 lb

## 2020-09-29 DIAGNOSIS — I712 Thoracic aortic aneurysm, without rupture, unspecified: Secondary | ICD-10-CM

## 2020-09-29 DIAGNOSIS — R911 Solitary pulmonary nodule: Secondary | ICD-10-CM

## 2020-09-29 DIAGNOSIS — I700 Atherosclerosis of aorta: Secondary | ICD-10-CM | POA: Diagnosis not present

## 2020-09-29 NOTE — Patient Instructions (Signed)
F/U in 1 year

## 2020-09-29 NOTE — Progress Notes (Signed)
         [address]       [place] [address]             [phone]     Referring Provider is No ref. provider found Primary Cardiologist is No primary care provider on file. PCP is Seward Carol, MD   HPI:  [name] is a an 80 year old pleasant female patient who presents to the office today for her annual surveillance CT scan of the chest for an ascending aortic aneurysm measuring 4.1 cm which has been stable since 2016.  She has been managing her hypertension with lisinopril which was recently decreased to 10 mg daily from 10 mg twice daily.  Her blood pressures have been well controlled at home with blood pressure ranging from 123456 systolic and 123XX123 diastolic.  She has been asymptomatic from her aortic aneurysm.  She also takes Crestor for hyperlipidemia management.  In reviewing her images today it shows no change in her ascending aortic aneurysm measuring 4.0 cm by report.  She has a stable 8 mm subpleural nodule located in the right lower lobe which has been seen on previous chest CT.   Current Outpatient Medications  Medication Sig Dispense Refill   Albuterol Sulfate (PROAIR RESPICLICK) 123XX123 (90 Base) MCG/ACT AEPB Inhale 2 puffs into the lungs every 4 (four) hours as needed (wheezing, tightness, shortness of breath). 1 each 5   aspirin 81 MG chewable tablet Chew 81 mg by mouth at bedtime.      Calcium Carb-Cholecalciferol (CALCIUM + D3 PO) Take 1 tablet by mouth daily.     dextromethorphan (DELSYM) 30 MG/5ML liquid Take 30 mg by mouth 2 (two) times daily.     famotidine (PEPCID) 10 MG tablet Take 10 mg by mouth 2 (two) times daily.     glucose blood test strip 1 each by Other route as needed. Use as instructed     INVOKANA 100 MG TABS tablet 1 TABLET BEFORE BREAKFAST 30 tablet 3   latanoprost (XALATAN) 0.005 % ophthalmic solution Place 1 drop into both eyes at bedtime.     lisinopril (PRINIVIL,ZESTRIL) 20 MG tablet Take 20 mg by mouth daily.      metFORMIN (GLUCOPHAGE) 850 MG tablet TAKE 2 TABLETS BY MOUTH EVERY DAY 180 tablet 1   Polyethyl Glycol-Propyl Glycol (SYSTANE) 0.4-0.3 % SOLN Place 1 drop into both eyes 2 (two) times daily as needed (dry/irritated eyes.).     rosuvastatin (CRESTOR) 10 MG tablet Take 10 mg by mouth at bedtime.      No current facility-administered medications for this visit.     Diagnostic Tests:  CT scan without contrast personally reviewed and discussed with patient showing a stable 4.0 cm fusiform ascending aneurysm  Impression:  Stable fusiform ascending aneurysm 4.0 cm Low risk for dissection less than 1% Best therapy is continued blood pressure control and surveillance scanning Plan:  We will arrange for a follow-up CT scan of the chest next year.She will continue to follow-up with her PCP for blood pressure management.     Nicholes Rough, PA-C

## 2020-11-15 DIAGNOSIS — I10 Essential (primary) hypertension: Secondary | ICD-10-CM | POA: Diagnosis not present

## 2020-11-15 DIAGNOSIS — K219 Gastro-esophageal reflux disease without esophagitis: Secondary | ICD-10-CM | POA: Diagnosis not present

## 2020-11-15 DIAGNOSIS — E785 Hyperlipidemia, unspecified: Secondary | ICD-10-CM | POA: Diagnosis not present

## 2020-11-15 DIAGNOSIS — J441 Chronic obstructive pulmonary disease with (acute) exacerbation: Secondary | ICD-10-CM | POA: Diagnosis not present

## 2020-11-15 DIAGNOSIS — E1151 Type 2 diabetes mellitus with diabetic peripheral angiopathy without gangrene: Secondary | ICD-10-CM | POA: Diagnosis not present

## 2020-11-15 DIAGNOSIS — J42 Unspecified chronic bronchitis: Secondary | ICD-10-CM | POA: Diagnosis not present

## 2020-11-15 DIAGNOSIS — E1169 Type 2 diabetes mellitus with other specified complication: Secondary | ICD-10-CM | POA: Diagnosis not present

## 2020-11-15 DIAGNOSIS — E119 Type 2 diabetes mellitus without complications: Secondary | ICD-10-CM | POA: Diagnosis not present

## 2020-11-15 DIAGNOSIS — N1831 Chronic kidney disease, stage 3a: Secondary | ICD-10-CM | POA: Diagnosis not present

## 2020-11-15 DIAGNOSIS — J449 Chronic obstructive pulmonary disease, unspecified: Secondary | ICD-10-CM | POA: Diagnosis not present

## 2020-12-26 ENCOUNTER — Other Ambulatory Visit: Payer: Self-pay

## 2020-12-26 ENCOUNTER — Encounter: Payer: Self-pay | Admitting: Endocrinology

## 2020-12-26 ENCOUNTER — Ambulatory Visit (INDEPENDENT_AMBULATORY_CARE_PROVIDER_SITE_OTHER): Payer: PPO | Admitting: Endocrinology

## 2020-12-26 VITALS — BP 130/78 | HR 83 | Ht 62.0 in | Wt 173.0 lb

## 2020-12-26 DIAGNOSIS — Z23 Encounter for immunization: Secondary | ICD-10-CM | POA: Diagnosis not present

## 2020-12-26 DIAGNOSIS — E1165 Type 2 diabetes mellitus with hyperglycemia: Secondary | ICD-10-CM

## 2020-12-26 LAB — POCT GLYCOSYLATED HEMOGLOBIN (HGB A1C): Hemoglobin A1C: 6.5 % — AB (ref 4.0–5.6)

## 2020-12-26 NOTE — Patient Instructions (Signed)
Check blood sugars on waking up 3 days a week  Also check blood sugars about 2 hours after meals and do this after different meals by rotation  Recommended blood sugar levels on waking up are 90-130 and about 2 hours after meal is 130-160  Please bring your blood sugar monitor to each visit, thank you  Start a walking routine
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
## 2021-01-04 DIAGNOSIS — Z0000 Encounter for general adult medical examination without abnormal findings: Secondary | ICD-10-CM | POA: Diagnosis not present

## 2021-01-04 DIAGNOSIS — Z1389 Encounter for screening for other disorder: Secondary | ICD-10-CM | POA: Diagnosis not present

## 2021-01-04 DIAGNOSIS — I5189 Other ill-defined heart diseases: Secondary | ICD-10-CM | POA: Diagnosis not present

## 2021-01-04 DIAGNOSIS — I10 Essential (primary) hypertension: Secondary | ICD-10-CM | POA: Diagnosis not present

## 2021-01-04 DIAGNOSIS — J449 Chronic obstructive pulmonary disease, unspecified: Secondary | ICD-10-CM | POA: Diagnosis not present

## 2021-01-04 DIAGNOSIS — Z7984 Long term (current) use of oral hypoglycemic drugs: Secondary | ICD-10-CM | POA: Diagnosis not present

## 2021-01-04 DIAGNOSIS — I7123 Aneurysm of the descending thoracic aorta, without rupture: Secondary | ICD-10-CM | POA: Diagnosis not present

## 2021-01-04 DIAGNOSIS — N1831 Chronic kidney disease, stage 3a: Secondary | ICD-10-CM | POA: Diagnosis not present

## 2021-01-04 DIAGNOSIS — E1151 Type 2 diabetes mellitus with diabetic peripheral angiopathy without gangrene: Secondary | ICD-10-CM | POA: Diagnosis not present

## 2021-01-15 ENCOUNTER — Telehealth: Payer: Self-pay | Admitting: *Deleted

## 2021-01-15 NOTE — Telephone Encounter (Signed)
"  I saw Dr. Sherryle Lis for a broke toe.  He told me to wrap the toe.  I have been wrapping it since July.  How long do I need to continue to wrap it?"

## 2021-01-16 DIAGNOSIS — Z1231 Encounter for screening mammogram for malignant neoplasm of breast: Secondary | ICD-10-CM | POA: Diagnosis not present

## 2021-02-01 ENCOUNTER — Other Ambulatory Visit: Payer: Self-pay | Admitting: Endocrinology

## 2021-02-06 DIAGNOSIS — J42 Unspecified chronic bronchitis: Secondary | ICD-10-CM | POA: Diagnosis not present

## 2021-02-06 DIAGNOSIS — J449 Chronic obstructive pulmonary disease, unspecified: Secondary | ICD-10-CM | POA: Diagnosis not present

## 2021-02-06 DIAGNOSIS — N1831 Chronic kidney disease, stage 3a: Secondary | ICD-10-CM | POA: Diagnosis not present

## 2021-02-06 DIAGNOSIS — E119 Type 2 diabetes mellitus without complications: Secondary | ICD-10-CM | POA: Diagnosis not present

## 2021-02-06 DIAGNOSIS — E1169 Type 2 diabetes mellitus with other specified complication: Secondary | ICD-10-CM | POA: Diagnosis not present

## 2021-02-06 DIAGNOSIS — K219 Gastro-esophageal reflux disease without esophagitis: Secondary | ICD-10-CM | POA: Diagnosis not present

## 2021-02-06 DIAGNOSIS — E1151 Type 2 diabetes mellitus with diabetic peripheral angiopathy without gangrene: Secondary | ICD-10-CM | POA: Diagnosis not present

## 2021-02-06 DIAGNOSIS — I10 Essential (primary) hypertension: Secondary | ICD-10-CM | POA: Diagnosis not present

## 2021-02-06 DIAGNOSIS — E785 Hyperlipidemia, unspecified: Secondary | ICD-10-CM | POA: Diagnosis not present

## 2021-02-06 DIAGNOSIS — H409 Unspecified glaucoma: Secondary | ICD-10-CM | POA: Diagnosis not present

## 2021-02-06 DIAGNOSIS — J441 Chronic obstructive pulmonary disease with (acute) exacerbation: Secondary | ICD-10-CM | POA: Diagnosis not present

## 2021-03-02 DIAGNOSIS — I10 Essential (primary) hypertension: Secondary | ICD-10-CM | POA: Diagnosis not present

## 2021-03-28 ENCOUNTER — Telehealth: Payer: Self-pay

## 2021-03-28 ENCOUNTER — Other Ambulatory Visit (HOSPITAL_COMMUNITY): Payer: Self-pay

## 2021-03-28 NOTE — Telephone Encounter (Signed)
Patient Advocate Encounter  Prior Authorization for Invokana 300mg  tabs has been approved.    PA# [id number]  Effective dates: 03/26/21 through 03/26/22  RTS for copay  Spoke with Pharmacy to Process.  Patient Advocate Fax: [phone]

## 2021-04-03 DIAGNOSIS — I10 Essential (primary) hypertension: Secondary | ICD-10-CM | POA: Diagnosis not present

## 2021-04-20 ENCOUNTER — Other Ambulatory Visit: Payer: Self-pay

## 2021-04-20 DIAGNOSIS — E1165 Type 2 diabetes mellitus with hyperglycemia: Secondary | ICD-10-CM

## 2021-04-20 MED ORDER — GLUCOSE BLOOD VI STRP: ORAL_STRIP | 3 refills | Status: DC

## 2021-04-20 NOTE — Telephone Encounter (Signed)
Pt left vm to get refill for onetouch meter. Rx sent to [facility] per patient request.

## 2021-04-30 ENCOUNTER — Other Ambulatory Visit: Payer: Self-pay | Admitting: Endocrinology

## 2021-04-30 ENCOUNTER — Other Ambulatory Visit: Payer: Self-pay

## 2021-04-30 ENCOUNTER — Other Ambulatory Visit (INDEPENDENT_AMBULATORY_CARE_PROVIDER_SITE_OTHER): Payer: PPO

## 2021-04-30 DIAGNOSIS — E1165 Type 2 diabetes mellitus with hyperglycemia: Secondary | ICD-10-CM

## 2021-04-30 DIAGNOSIS — E7800 Pure hypercholesterolemia, unspecified: Secondary | ICD-10-CM

## 2021-04-30 LAB — COMPREHENSIVE METABOLIC PANEL
ALT: 10 U/L (ref 0–35)
AST: 15 U/L (ref 0–37)
Albumin: 4.7 g/dL (ref 3.5–5.2)
Alkaline Phosphatase: 35 U/L — ABNORMAL LOW (ref 39–117)
BUN: 28 mg/dL — ABNORMAL HIGH (ref 6–23)
CO2: 27 mEq/L (ref 19–32)
Calcium: 10.1 mg/dL (ref 8.4–10.5)
Chloride: 101 mEq/L (ref 96–112)
Creatinine, Ser: 1.19 mg/dL (ref 0.40–1.20)
GFR: 43.01 mL/min — ABNORMAL LOW (ref >60.00)
Glucose, Bld: 134 mg/dL — ABNORMAL HIGH (ref 70–99)
Potassium: 4.9 mEq/L (ref 3.5–5.1)
Sodium: 139 mEq/L (ref 135–145)
Total Bilirubin: 0.4 mg/dL (ref 0.2–1.2)
Total Protein: 7.8 g/dL (ref 6.0–8.3)

## 2021-04-30 LAB — LIPID PANEL
Cholesterol: 131 mg/dL (ref 0–200)
HDL: 57.6 mg/dL (ref >39.00)
LDL Cholesterol: 60 mg/dL (ref 0–99)
NonHDL: 73.67
Total CHOL/HDL Ratio: 2
Triglycerides: 66 mg/dL (ref 0.0–149.0)
VLDL: 13.2 mg/dL (ref 0.0–40.0)

## 2021-04-30 LAB — HEMOGLOBIN A1C: Hgb A1c MFr Bld: 7.3 % — ABNORMAL HIGH (ref 4.6–6.5)

## 2021-05-01 DIAGNOSIS — I10 Essential (primary) hypertension: Secondary | ICD-10-CM | POA: Diagnosis not present

## 2021-05-03 ENCOUNTER — Encounter: Payer: Self-pay | Admitting: Endocrinology

## 2021-05-03 ENCOUNTER — Ambulatory Visit: Payer: PPO | Admitting: Endocrinology

## 2021-05-03 ENCOUNTER — Other Ambulatory Visit: Payer: Self-pay

## 2021-05-03 VITALS — BP 102/70 | HR 82 | Ht 60.5 in | Wt 177.2 lb

## 2021-05-03 DIAGNOSIS — R002 Palpitations: Secondary | ICD-10-CM | POA: Diagnosis not present

## 2021-05-03 DIAGNOSIS — E1165 Type 2 diabetes mellitus with hyperglycemia: Secondary | ICD-10-CM | POA: Diagnosis not present

## 2021-05-03 DIAGNOSIS — E7800 Pure hypercholesterolemia, unspecified: Secondary | ICD-10-CM

## 2021-05-03 DIAGNOSIS — I10 Essential (primary) hypertension: Secondary | ICD-10-CM

## 2021-05-03 DIAGNOSIS — N183 Chronic kidney disease, stage 3 unspecified: Secondary | ICD-10-CM | POA: Diagnosis not present

## 2021-05-03 DIAGNOSIS — R42 Dizziness and giddiness: Secondary | ICD-10-CM | POA: Diagnosis not present

## 2021-05-03 MED ORDER — METFORMIN HCL ER 500 MG PO TB24: 1000.0000 mg | ORAL_TABLET | Freq: Every day | ORAL | 3 refills | Status: DC

## 2021-05-03 MED ORDER — RYBELSUS 3 MG PO TABS
3.0000 mg | ORAL_TABLET | Freq: Every day | ORAL | 0 refills | Status: DC
Start: 2021-05-03 — End: 2021-05-22
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
## 2021-05-03 NOTE — Patient Instructions (Addendum)
Check blood sugars on waking up 2 days a week ? ?Also check blood sugars about 2 hours after meals and do this after different meals by rotation ? ?Recommended blood sugar levels on waking up are 90-130 and about 2 hours after meal is 130-160 ? ?Please bring your blood sugar monitor to each visit, thank you ? ?Rybelsus improves blood sugar control as well as can help with weight loss and reduces cardiovascular events. ?Need to take the capsules on empty stomach 30 minutes before breakfast with 4 ounces of water daily.   ? ?You may feel more fullness at mealtimes and try to keep portions at meals smaller ?Some people may have nausea or even vomiting that may occur in the first few days; usually the symptoms go away with time. ? ?Please call if nausea or vomiting does not improve within 2 weeks  ? ?Start new Rx for Metformin ?

## 2021-05-21 ENCOUNTER — Telehealth: Payer: Self-pay

## 2021-05-21 DIAGNOSIS — E1165 Type 2 diabetes mellitus with hyperglycemia: Secondary | ICD-10-CM

## 2021-05-21 NOTE — Telephone Encounter (Signed)
Attempted to call patient no answer. Left vm to call us back ?

## 2021-05-21 NOTE — Telephone Encounter (Signed)
Patient called about medications. Confirming she is supposed to be taking metformin, Invokana, and Rybelsus. Informed patient per last office note that is correct. Patient also wanted you to know that she has about 90 days of '850mg'$  of metformin left. Wants to know if its ok to take 1 tablet a day until finished and then go to '1000mg'$  a day? Please advise ?

## 2021-05-22 MED ORDER — RYBELSUS 7 MG PO TABS: 7.0000 mg | ORAL_TABLET | Freq: Every day | ORAL | 0 refills | Status: DC

## 2021-06-06 DIAGNOSIS — I10 Essential (primary) hypertension: Secondary | ICD-10-CM | POA: Diagnosis not present

## 2021-06-19 ENCOUNTER — Other Ambulatory Visit: Payer: Self-pay | Admitting: Endocrinology

## 2021-06-19 DIAGNOSIS — E1165 Type 2 diabetes mellitus with hyperglycemia: Secondary | ICD-10-CM

## 2021-06-20 DIAGNOSIS — I10 Essential (primary) hypertension: Secondary | ICD-10-CM | POA: Diagnosis not present

## 2021-06-28 ENCOUNTER — Other Ambulatory Visit (INDEPENDENT_AMBULATORY_CARE_PROVIDER_SITE_OTHER): Payer: PPO

## 2021-06-28 DIAGNOSIS — E1165 Type 2 diabetes mellitus with hyperglycemia: Secondary | ICD-10-CM

## 2021-06-28 LAB — MICROALBUMIN / CREATININE URINE RATIO
Creatinine,U: 52.7 mg/dL
Microalb Creat Ratio: 10.3 mg/g (ref 0.0–30.0)
Microalb, Ur: 5.4 mg/dL — ABNORMAL HIGH (ref 0.0–1.9)

## 2021-06-28 LAB — BASIC METABOLIC PANEL
BUN: 20 mg/dL (ref 6–23)
CO2: 28 mEq/L (ref 19–32)
Calcium: 9.7 mg/dL (ref 8.4–10.5)
Chloride: 100 mEq/L (ref 96–112)
Creatinine, Ser: 0.93 mg/dL (ref 0.40–1.20)
GFR: 57.75 mL/min — ABNORMAL LOW (ref >60.00)
Glucose, Bld: 128 mg/dL — ABNORMAL HIGH (ref 70–99)
Potassium: 4.3 mEq/L (ref 3.5–5.1)
Sodium: 137 mEq/L (ref 135–145)

## 2021-06-29 LAB — FRUCTOSAMINE: Fructosamine: 247 umol/L (ref 0–285)

## 2021-07-02 DIAGNOSIS — I712 Thoracic aortic aneurysm, without rupture, unspecified: Secondary | ICD-10-CM | POA: Diagnosis not present

## 2021-07-02 DIAGNOSIS — E1169 Type 2 diabetes mellitus with other specified complication: Secondary | ICD-10-CM | POA: Diagnosis not present

## 2021-07-02 DIAGNOSIS — E7800 Pure hypercholesterolemia, unspecified: Secondary | ICD-10-CM | POA: Diagnosis not present

## 2021-07-02 DIAGNOSIS — N1831 Chronic kidney disease, stage 3a: Secondary | ICD-10-CM | POA: Diagnosis not present

## 2021-07-02 DIAGNOSIS — I10 Essential (primary) hypertension: Secondary | ICD-10-CM | POA: Diagnosis not present

## 2021-07-05 ENCOUNTER — Encounter: Payer: Self-pay | Admitting: Endocrinology

## 2021-07-05 ENCOUNTER — Ambulatory Visit: Payer: PPO | Admitting: Endocrinology

## 2021-07-05 DIAGNOSIS — E1165 Type 2 diabetes mellitus with hyperglycemia: Secondary | ICD-10-CM

## 2021-07-05 MED ORDER — RYBELSUS 7 MG PO TABS: 1.0000 | ORAL_TABLET | Freq: Every day | ORAL | 3 refills | Status: DC
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
## 2021-07-05 NOTE — Patient Instructions (Signed)
Check blood sugars on waking up 2-3  days a week ? ?Also check blood sugars about 2 hours after meals and do this after different meals by rotation ? ?Recommended blood sugar levels on waking up are 90-130 and about 2 hours after meal is 130-160 ? ?Please bring your blood sugar monitor to each visit, thank you ? ?Walk daily ?

## 2021-07-13 DIAGNOSIS — K5900 Constipation, unspecified: Secondary | ICD-10-CM | POA: Diagnosis not present

## 2021-07-13 DIAGNOSIS — R1084 Generalized abdominal pain: Secondary | ICD-10-CM | POA: Diagnosis not present

## 2021-07-19 DIAGNOSIS — H40053 Ocular hypertension, bilateral: Secondary | ICD-10-CM | POA: Diagnosis not present

## 2021-07-19 DIAGNOSIS — E119 Type 2 diabetes mellitus without complications: Secondary | ICD-10-CM | POA: Diagnosis not present

## 2021-08-01 DIAGNOSIS — J449 Chronic obstructive pulmonary disease, unspecified: Secondary | ICD-10-CM | POA: Diagnosis not present

## 2021-08-01 DIAGNOSIS — E785 Hyperlipidemia, unspecified: Secondary | ICD-10-CM | POA: Diagnosis not present

## 2021-08-01 DIAGNOSIS — N1831 Chronic kidney disease, stage 3a: Secondary | ICD-10-CM | POA: Diagnosis not present

## 2021-08-01 DIAGNOSIS — K219 Gastro-esophageal reflux disease without esophagitis: Secondary | ICD-10-CM | POA: Diagnosis not present

## 2021-08-01 DIAGNOSIS — I10 Essential (primary) hypertension: Secondary | ICD-10-CM | POA: Diagnosis not present

## 2021-08-01 DIAGNOSIS — E1151 Type 2 diabetes mellitus with diabetic peripheral angiopathy without gangrene: Secondary | ICD-10-CM | POA: Diagnosis not present

## 2021-08-08 ENCOUNTER — Other Ambulatory Visit: Payer: Self-pay | Admitting: Thoracic Surgery (Cardiothoracic Vascular Surgery)

## 2021-08-08 DIAGNOSIS — I712 Thoracic aortic aneurysm, without rupture, unspecified: Secondary | ICD-10-CM

## 2021-08-20 DIAGNOSIS — E1151 Type 2 diabetes mellitus with diabetic peripheral angiopathy without gangrene: Secondary | ICD-10-CM | POA: Diagnosis not present

## 2021-08-20 DIAGNOSIS — I10 Essential (primary) hypertension: Secondary | ICD-10-CM | POA: Diagnosis not present

## 2021-08-20 DIAGNOSIS — E785 Hyperlipidemia, unspecified: Secondary | ICD-10-CM | POA: Diagnosis not present

## 2021-08-28 DIAGNOSIS — H6692 Otitis media, unspecified, left ear: Secondary | ICD-10-CM | POA: Diagnosis not present

## 2021-08-28 DIAGNOSIS — R051 Acute cough: Secondary | ICD-10-CM | POA: Diagnosis not present

## 2021-08-29 DIAGNOSIS — K5900 Constipation, unspecified: Secondary | ICD-10-CM | POA: Diagnosis not present

## 2021-09-25 ENCOUNTER — Ambulatory Visit: Payer: PPO | Admitting: Surgical

## 2021-09-25 ENCOUNTER — Ambulatory Visit
Admission: RE | Admit: 2021-09-25 | Discharge: 2021-09-25 | Disposition: A | Discharge status: Home / Self Care | Payer: PPO | Source: Ambulatory Visit | Attending: Thoracic Surgery (Cardiothoracic Vascular Surgery) | Admitting: Thoracic Surgery (Cardiothoracic Vascular Surgery)

## 2021-09-25 VITALS — BP 145/80 | HR 76 | Resp 20 | Ht 61.5 in | Wt 170.0 lb

## 2021-09-25 DIAGNOSIS — I712 Thoracic aortic aneurysm, without rupture, unspecified: Secondary | ICD-10-CM | POA: Diagnosis not present

## 2021-09-25 DIAGNOSIS — I7121 Aneurysm of the ascending aorta, without rupture: Secondary | ICD-10-CM

## 2021-09-25 NOTE — Patient Instructions (Signed)
Discussed nutrition, lifestyle and medication management of chronic conditions.

## 2021-09-25 NOTE — Progress Notes (Signed)
Subjective:     Patient ID: Angel Tanner, female    DOB: 03-19-1940, 81 y.o.   MRN: [id number]  Chief Complaint  Patient presents with   Thoracic Aortic Aneurysm    1 year f/u with Chest CT    HPI Patient is in today for follow-up surveillance on small thoracic ascending aortic aneurysm.  Last year it measured 4.0 cm.  On today's exam it is stable and the full report is listed below.  Cardiac risk factors are noted for hypertension, hypercholesterolemia as well as type 2 diabetes.  She also has significant pulmonary disease including COPD/emphysema.  Coronary calcifications are noted on the scan.  There is also a 12 mm pulmonary nodule which is stable in appearance.  He denies any chest pain or shortness of breath currently.  She stays quite active and says her blood pressure is typically better than on today's visit.  She had not taken her medication today yet.  ROS: She does have some constipation but is otherwise unremarkable     Objective:    BP (!) 145/80   Pulse 76   Resp 20   Ht 5' 1.5" (1.562 m)   Wt 170 lb (77.1 kg)   SpO2 91%   BMI 31.60 kg/m  BP Readings from Last 3 Encounters:  09/25/21 (!) 145/80  07/05/21 124/82  05/03/21 102/70   Wt Readings from Last 3 Encounters:  09/25/21 170 lb (77.1 kg)  07/05/21 170 lb 6.4 oz (77.3 kg)  05/03/21 177 lb 3.2 oz (80.4 kg)      Physical Exam Vitals reviewed.  Constitutional:      General: She is not in acute distress.    Appearance: Normal appearance. She is not ill-appearing.  HENT:     Head: Normocephalic and atraumatic.  Cardiovascular:     Rate and Rhythm: Normal rate and regular rhythm.     Heart sounds: No murmur heard.    No friction rub. No gallop.  Pulmonary:     Effort: Pulmonary effort is normal.     Breath sounds: Normal breath sounds.  Abdominal:     General: There is no distension.     Palpations: Abdomen is soft.     Tenderness: There is no abdominal tenderness.  Musculoskeletal:      Cervical back: Normal range of motion.     Right lower leg: No edema.     Left lower leg: No edema.     Comments: Spider veins lower extremities and ankles  Skin:    Capillary Refill: Capillary refill takes less than 2 seconds.     Coloration: Skin is not jaundiced or pale.  Neurological:     General: No focal deficit present.     Mental Status: She is alert and oriented to person, place, and time.  Psychiatric:        Mood and Affect: Mood normal.        Behavior: Behavior normal.        Thought Content: Thought content normal.        Judgment: Judgment normal.     Narrative & Impression  CLINICAL DATA:  Thoracic aortic aneurysm   EXAM: CT CHEST WITHOUT CONTRAST   TECHNIQUE: Multidetector CT imaging of the chest was performed following the standard protocol without IV contrast.   RADIATION DOSE REDUCTION: This exam was performed according to the departmental dose-optimization program which includes automated exposure control, adjustment of the mA and/or kV according to patient size and/or  use of iterative reconstruction technique.   COMPARISON:  Previous studies including the examination of 09/29/2020   FINDINGS: Cardiovascular: There is aneurysm in the ascending thoracic aorta measuring 4 cm in diameter with no significant interval change. Scattered calcifications are seen in thoracic aorta. Coronary artery calcifications are seen.   Mediastinum/Nodes: No new significant lymphadenopathy is seen.   Lungs/Pleura: Few blebs are seen in the right apex. There is 8 mm pleural-based nodule in right lower lobe with no significant change. There are patchy linear densities in lower lung fields with no significant interval change suggesting scarring. There is no new focal pulmonary consolidation. There is no pleural effusion or pneumothorax.   Upper Abdomen: Small hiatal hernia is seen. There is 12 mm low-density nodule in retrocrural region with no significant change.    Musculoskeletal: No acute findings are seen.   IMPRESSION: There is 4 cm aneurysm in ascending thoracic aorta which has not changed significantly since 09/29/2020. Recommend annual imaging followup by CTA or MRA. This recommendation follows 2010 ACCF/AHA/AATS/ACR/ASA/SCA/SCAI/SIR/STS/SVM Guidelines for the Diagnosis and Management of Patients with Thoracic Aortic Disease. Circulation. 2010; 121: O973-Z329. Aortic aneurysm NOS (ICD10-I71.9)   Coronary artery calcifications are seen. 8 mm nodule in right lower lobe appears stable. Small hiatal hernia.     Electronically Signed   By: Elmer Picker M.D.   On: 09/25/2021 12:54       Assessment & Plan:   Problem List Items Addressed This Visit   None Visit Diagnoses     Aneurysm of ascending aorta without rupture ([place])    -  Primary      A/P: Patient's aortic ascending thoracic aneurysm is stable.  We will continue to follow yearly with repeat scans for now.  He has not shown any significant growth in size in the past 3 years.  We discussed nutrition, lifestyle and medication management for aneurysmal disease.  She will keep a diary of her blood pressure with more frequent readings and discuss with PMD should blood pressure readings typically run higher than normal.   No orders of the defined types were placed in this encounter.   No follow-ups on file.  John Giovanni, PA-C

## 2021-09-26 ENCOUNTER — Other Ambulatory Visit: Payer: Self-pay | Admitting: Emergency Medicine

## 2021-10-15 ENCOUNTER — Other Ambulatory Visit (INDEPENDENT_AMBULATORY_CARE_PROVIDER_SITE_OTHER): Payer: PPO

## 2021-10-15 DIAGNOSIS — E1165 Type 2 diabetes mellitus with hyperglycemia: Secondary | ICD-10-CM | POA: Diagnosis not present

## 2021-10-15 LAB — BASIC METABOLIC PANEL
BUN: 18 mg/dL (ref 6–23)
CO2: 29 mEq/L (ref 19–32)
Calcium: 10.3 mg/dL (ref 8.4–10.5)
Chloride: 100 mEq/L (ref 96–112)
Creatinine, Ser: 0.86 mg/dL (ref 0.40–1.20)
GFR: 63.31 mL/min (ref >60.00)
Glucose, Bld: 98 mg/dL (ref 70–99)
Potassium: 4.3 mEq/L (ref 3.5–5.1)
Sodium: 138 mEq/L (ref 135–145)

## 2021-10-15 LAB — HEMOGLOBIN A1C: Hgb A1c MFr Bld: 7.2 % — ABNORMAL HIGH (ref 4.6–6.5)

## 2021-10-18 ENCOUNTER — Ambulatory Visit (INDEPENDENT_AMBULATORY_CARE_PROVIDER_SITE_OTHER): Payer: PPO | Admitting: Endocrinology

## 2021-10-18 ENCOUNTER — Encounter: Payer: Self-pay | Admitting: Endocrinology

## 2021-10-18 VITALS — BP 142/78 | HR 87 | Ht 61.5 in | Wt 169.6 lb

## 2021-10-18 DIAGNOSIS — E7800 Pure hypercholesterolemia, unspecified: Secondary | ICD-10-CM

## 2021-10-18 DIAGNOSIS — I10 Essential (primary) hypertension: Secondary | ICD-10-CM | POA: Diagnosis not present

## 2021-10-18 DIAGNOSIS — E1165 Type 2 diabetes mellitus with hyperglycemia: Secondary | ICD-10-CM

## 2021-10-18 MED ORDER — RYBELSUS 14 MG PO TABS: 14.0000 mg | ORAL_TABLET | Freq: Every day | ORAL | 5 refills | Status: DC

## 2021-10-18 NOTE — Patient Instructions (Signed)
Take '14mg'$  Rybelsus on next Rx
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
## 2021-10-24 ENCOUNTER — Telehealth: Payer: Self-pay

## 2021-10-24 NOTE — Telephone Encounter (Signed)
Patient called to check status of patient assistance application for Rybelsus. I called [employer] and states that it is approved until 03/03/2022. Medication will ship 10-14 days from 10/22/21. I notified patient and she is aware.

## 2021-11-08 NOTE — Telephone Encounter (Signed)
Called to inform patient that 4 boxes of Rybelsus '7mg'$  is ready for pickup. Vm left

## 2021-11-13 NOTE — Telephone Encounter (Signed)
Patient's son, Shawnae Leiva, came in office and picked up 4 boxes of Rybelsus 7 mg.

## 2021-11-27 DIAGNOSIS — N1831 Chronic kidney disease, stage 3a: Secondary | ICD-10-CM | POA: Diagnosis not present

## 2021-11-27 DIAGNOSIS — E1151 Type 2 diabetes mellitus with diabetic peripheral angiopathy without gangrene: Secondary | ICD-10-CM | POA: Diagnosis not present

## 2021-11-27 DIAGNOSIS — K219 Gastro-esophageal reflux disease without esophagitis: Secondary | ICD-10-CM | POA: Diagnosis not present

## 2021-11-27 DIAGNOSIS — E785 Hyperlipidemia, unspecified: Secondary | ICD-10-CM | POA: Diagnosis not present

## 2021-11-27 DIAGNOSIS — I10 Essential (primary) hypertension: Secondary | ICD-10-CM | POA: Diagnosis not present

## 2021-12-06 DIAGNOSIS — E785 Hyperlipidemia, unspecified: Secondary | ICD-10-CM | POA: Diagnosis not present

## 2021-12-06 DIAGNOSIS — N1831 Chronic kidney disease, stage 3a: Secondary | ICD-10-CM | POA: Diagnosis not present

## 2021-12-06 DIAGNOSIS — K219 Gastro-esophageal reflux disease without esophagitis: Secondary | ICD-10-CM | POA: Diagnosis not present

## 2021-12-06 DIAGNOSIS — E1151 Type 2 diabetes mellitus with diabetic peripheral angiopathy without gangrene: Secondary | ICD-10-CM | POA: Diagnosis not present

## 2021-12-06 DIAGNOSIS — H409 Unspecified glaucoma: Secondary | ICD-10-CM | POA: Diagnosis not present

## 2021-12-06 DIAGNOSIS — I10 Essential (primary) hypertension: Secondary | ICD-10-CM | POA: Diagnosis not present

## 2021-12-06 DIAGNOSIS — J449 Chronic obstructive pulmonary disease, unspecified: Secondary | ICD-10-CM | POA: Diagnosis not present

## 2022-01-02 DIAGNOSIS — E7800 Pure hypercholesterolemia, unspecified: Secondary | ICD-10-CM | POA: Diagnosis not present

## 2022-01-02 DIAGNOSIS — I10 Essential (primary) hypertension: Secondary | ICD-10-CM | POA: Diagnosis not present

## 2022-01-02 DIAGNOSIS — N1831 Chronic kidney disease, stage 3a: Secondary | ICD-10-CM | POA: Diagnosis not present

## 2022-01-02 DIAGNOSIS — I7121 Aneurysm of the ascending aorta, without rupture: Secondary | ICD-10-CM | POA: Diagnosis not present

## 2022-01-02 DIAGNOSIS — E1169 Type 2 diabetes mellitus with other specified complication: Secondary | ICD-10-CM | POA: Diagnosis not present

## 2022-01-22 DIAGNOSIS — Z1231 Encounter for screening mammogram for malignant neoplasm of breast: Secondary | ICD-10-CM | POA: Diagnosis not present

## 2022-02-11 ENCOUNTER — Other Ambulatory Visit (INDEPENDENT_AMBULATORY_CARE_PROVIDER_SITE_OTHER): Payer: PPO

## 2022-02-11 DIAGNOSIS — E1165 Type 2 diabetes mellitus with hyperglycemia: Secondary | ICD-10-CM

## 2022-02-11 DIAGNOSIS — E7800 Pure hypercholesterolemia, unspecified: Secondary | ICD-10-CM

## 2022-02-11 LAB — BASIC METABOLIC PANEL
BUN: 17 mg/dL (ref 6–23)
CO2: 28 mEq/L (ref 19–32)
Calcium: 10 mg/dL (ref 8.4–10.5)
Chloride: 101 mEq/L (ref 96–112)
Creatinine, Ser: 0.79 mg/dL (ref 0.40–1.20)
GFR: 69.94 mL/min (ref >60.00)
Glucose, Bld: 130 mg/dL — ABNORMAL HIGH (ref 70–99)
Potassium: 3.8 mEq/L (ref 3.5–5.1)
Sodium: 138 mEq/L (ref 135–145)

## 2022-02-11 LAB — LIPID PANEL
Cholesterol: 130 mg/dL (ref 0–200)
HDL: 59.5 mg/dL (ref >39.00)
LDL Cholesterol: 58 mg/dL (ref 0–99)
NonHDL: 70.35
Total CHOL/HDL Ratio: 2
Triglycerides: 61 mg/dL (ref 0.0–149.0)
VLDL: 12.2 mg/dL (ref 0.0–40.0)

## 2022-02-11 LAB — HEMOGLOBIN A1C: Hgb A1c MFr Bld: 7.1 % — ABNORMAL HIGH (ref 4.6–6.5)

## 2022-02-14 ENCOUNTER — Other Ambulatory Visit: Payer: Self-pay | Admitting: Endocrinology

## 2022-02-15 ENCOUNTER — Encounter: Payer: Self-pay | Admitting: Endocrinology

## 2022-02-15 ENCOUNTER — Ambulatory Visit: Payer: PPO | Admitting: Endocrinology

## 2022-02-15 VITALS — BP 130/86 | HR 89 | Ht 61.5 in | Wt 170.6 lb

## 2022-02-15 DIAGNOSIS — E1165 Type 2 diabetes mellitus with hyperglycemia: Secondary | ICD-10-CM

## 2022-02-15 DIAGNOSIS — E7800 Pure hypercholesterolemia, unspecified: Secondary | ICD-10-CM
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
## 2022-02-15 NOTE — Patient Instructions (Addendum)
Please  start taking 14 mg Rybelsus   Walk daily  Check blood sugars on waking up 2 days a week  Also check blood sugars about 2 hours after meals and do this after different meals by rotation  Recommended blood sugar levels on waking up are 90-130 and about 2 hours after meal is 130-160  Please bring your blood sugar monitor to each visit, thank you

## 2022-02-18 DIAGNOSIS — I10 Essential (primary) hypertension: Secondary | ICD-10-CM | POA: Diagnosis not present

## 2022-02-18 DIAGNOSIS — E1151 Type 2 diabetes mellitus with diabetic peripheral angiopathy without gangrene: Secondary | ICD-10-CM | POA: Diagnosis not present

## 2022-02-18 DIAGNOSIS — K219 Gastro-esophageal reflux disease without esophagitis: Secondary | ICD-10-CM | POA: Diagnosis not present

## 2022-02-18 DIAGNOSIS — N1831 Chronic kidney disease, stage 3a: Secondary | ICD-10-CM | POA: Diagnosis not present

## 2022-02-18 DIAGNOSIS — E785 Hyperlipidemia, unspecified: Secondary | ICD-10-CM | POA: Diagnosis not present

## 2022-02-18 DIAGNOSIS — J449 Chronic obstructive pulmonary disease, unspecified: Secondary | ICD-10-CM | POA: Diagnosis not present

## 2022-02-19 ENCOUNTER — Other Ambulatory Visit: Payer: Self-pay | Admitting: Endocrinology

## 2022-02-27 ENCOUNTER — Other Ambulatory Visit: Payer: Self-pay

## 2022-02-27 DIAGNOSIS — E1165 Type 2 diabetes mellitus with hyperglycemia: Secondary | ICD-10-CM

## 2022-02-27 MED ORDER — METFORMIN HCL ER 500 MG PO TB24: 500.0000 mg | ORAL_TABLET | Freq: Every day | ORAL | 3 refills | Status: DC

## 2022-02-27 MED ORDER — METFORMIN HCL ER 500 MG PO TB24: 1000.0000 mg | ORAL_TABLET | Freq: Every day | ORAL | 3 refills | Status: DC

## 2022-02-27 NOTE — Addendum Note (Signed)
Addended by: Cinda Quest on: 02/27/2022 02:46 PM   Modules accepted: Orders

## 2022-03-07 DIAGNOSIS — H16202 Unspecified keratoconjunctivitis, left eye: Secondary | ICD-10-CM | POA: Diagnosis not present

## 2022-03-13 DIAGNOSIS — H16202 Unspecified keratoconjunctivitis, left eye: Secondary | ICD-10-CM | POA: Diagnosis not present

## 2022-03-13 DIAGNOSIS — H25813 Combined forms of age-related cataract, bilateral: Secondary | ICD-10-CM | POA: Diagnosis not present

## 2022-03-20 DIAGNOSIS — H2000 Unspecified acute and subacute iridocyclitis: Secondary | ICD-10-CM | POA: Diagnosis not present

## 2022-03-20 DIAGNOSIS — H5712 Ocular pain, left eye: Secondary | ICD-10-CM | POA: Diagnosis not present

## 2022-03-20 DIAGNOSIS — H15102 Unspecified episcleritis, left eye: Secondary | ICD-10-CM | POA: Diagnosis not present

## 2022-03-22 DIAGNOSIS — H15102 Unspecified episcleritis, left eye: Secondary | ICD-10-CM | POA: Diagnosis not present

## 2022-03-22 DIAGNOSIS — H2000 Unspecified acute and subacute iridocyclitis: Secondary | ICD-10-CM | POA: Diagnosis not present

## 2022-03-25 DIAGNOSIS — I7121 Aneurysm of the ascending aorta, without rupture: Secondary | ICD-10-CM | POA: Diagnosis not present

## 2022-03-25 DIAGNOSIS — N1831 Chronic kidney disease, stage 3a: Secondary | ICD-10-CM | POA: Diagnosis not present

## 2022-03-25 DIAGNOSIS — Z1331 Encounter for screening for depression: Secondary | ICD-10-CM | POA: Diagnosis not present

## 2022-03-25 DIAGNOSIS — G4730 Sleep apnea, unspecified: Secondary | ICD-10-CM | POA: Diagnosis not present

## 2022-03-25 DIAGNOSIS — Z0000 Encounter for general adult medical examination without abnormal findings: Secondary | ICD-10-CM | POA: Diagnosis not present

## 2022-03-25 DIAGNOSIS — E1169 Type 2 diabetes mellitus with other specified complication: Secondary | ICD-10-CM | POA: Diagnosis not present

## 2022-03-25 DIAGNOSIS — J449 Chronic obstructive pulmonary disease, unspecified: Secondary | ICD-10-CM | POA: Diagnosis not present

## 2022-04-08 DIAGNOSIS — H2000 Unspecified acute and subacute iridocyclitis: Secondary | ICD-10-CM | POA: Diagnosis not present

## 2022-04-15 DIAGNOSIS — H2000 Unspecified acute and subacute iridocyclitis: Secondary | ICD-10-CM | POA: Diagnosis not present

## 2022-04-22 DIAGNOSIS — H2000 Unspecified acute and subacute iridocyclitis: Secondary | ICD-10-CM | POA: Diagnosis not present

## 2022-04-30 DIAGNOSIS — E1151 Type 2 diabetes mellitus with diabetic peripheral angiopathy without gangrene: Secondary | ICD-10-CM | POA: Diagnosis not present

## 2022-04-30 DIAGNOSIS — K219 Gastro-esophageal reflux disease without esophagitis: Secondary | ICD-10-CM | POA: Diagnosis not present

## 2022-04-30 DIAGNOSIS — N1831 Chronic kidney disease, stage 3a: Secondary | ICD-10-CM | POA: Diagnosis not present

## 2022-04-30 DIAGNOSIS — I10 Essential (primary) hypertension: Secondary | ICD-10-CM | POA: Diagnosis not present

## 2022-04-30 DIAGNOSIS — J449 Chronic obstructive pulmonary disease, unspecified: Secondary | ICD-10-CM | POA: Diagnosis not present

## 2022-04-30 DIAGNOSIS — E785 Hyperlipidemia, unspecified: Secondary | ICD-10-CM | POA: Diagnosis not present

## 2022-05-16 DIAGNOSIS — H16223 Keratoconjunctivitis sicca, not specified as Sjogren's, bilateral: Secondary | ICD-10-CM | POA: Diagnosis not present

## 2022-05-16 DIAGNOSIS — H02834 Dermatochalasis of left upper eyelid: Secondary | ICD-10-CM | POA: Diagnosis not present

## 2022-05-16 DIAGNOSIS — H02831 Dermatochalasis of right upper eyelid: Secondary | ICD-10-CM | POA: Diagnosis not present

## 2022-06-13 DIAGNOSIS — H16223 Keratoconjunctivitis sicca, not specified as Sjogren's, bilateral: Secondary | ICD-10-CM | POA: Diagnosis not present

## 2022-07-11 ENCOUNTER — Ambulatory Visit: Payer: PPO | Admitting: Podiatry

## 2022-07-11 ENCOUNTER — Encounter: Payer: Self-pay | Admitting: Podiatry

## 2022-07-11 VITALS — BP 144/87 | HR 85

## 2022-07-11 DIAGNOSIS — L600 Ingrowing nail: Secondary | ICD-10-CM | POA: Diagnosis not present

## 2022-07-11 NOTE — Progress Notes (Signed)
Subjective:  Patient ID: Angel Tanner, female    DOB: 1941-01-01,  MRN: [id number]  Chief Complaint  Patient presents with   Nail Problem    "I want him to take care of that ingrown toenail on the left big toe."    82 y.o. female presents with the above complaint. Patient presents with left lateral border ingrown worse worse with ambulation worse with pressure she would like for me to remove she is not able to do it herself pain scale 7 out of 10 dull achy in nature.  She would like me to remove it.  Pain scale 7-she is diabetic but controlled   Review of Systems: Negative except as noted in the HPI. Denies N/V/F/Ch.  Past Medical History:  Diagnosis Date   Asthma    COPD (chronic obstructive pulmonary disease) (HCC)    Diabetes mellitus    Emphysema of lung (HCC)    Hypercholesteremia    Hypertension     Current Outpatient Medications:    Albuterol Sulfate (PROAIR RESPICLICK) 108 (90 Base) MCG/ACT AEPB, INHALE TWO PUFFS into lungs EVERY 4 HOURS AS NEEDED FOR WHEEZING, FOR SHORTNESS OF BREATH AND FOR tightness, Disp: 1 each, Rfl: 0   aspirin 81 MG chewable tablet, Chew 81 mg by mouth at bedtime. , Disp: , Rfl:    Calcium Carb-Cholecalciferol (CALCIUM + D3 PO), Take 1 tablet by mouth daily., Disp: , Rfl:    glucose blood test strip, Use OneTouch Verio test strips to check blood sugar once a day. DX:E11.65, Disp: 100 each, Rfl: 3   hydrochlorothiazide (HYDRODIURIL) 12.5 MG tablet, Take 12.5 mg by mouth every morning., Disp: , Rfl:    INVOKANA 300 MG TABS tablet, TAKE ONE TABLET BY MOUTH BEFORE BREAKFAST, Disp: 90 tablet, Rfl: 3   latanoprost (XALATAN) 0.005 % ophthalmic solution, Place 1 drop into both eyes at bedtime., Disp: , Rfl:    lisinopril (ZESTRIL) 10 MG tablet, Take 1 tablet by mouth daily., Disp: , Rfl:    metFORMIN (GLUCOPHAGE-XR) 500 MG 24 hr tablet, Take 1 tablet (500 mg total) by mouth daily with supper., Disp: 90 tablet, Rfl: 3   omeprazole (PRILOSEC) 20 MG  capsule, Take 20 mg by mouth daily., Disp: , Rfl:    Polyethyl Glycol-Propyl Glycol 0.4-0.3 % SOLN, Place 1 drop into both eyes 2 (two) times daily as needed (dry/irritated eyes.)., Disp: , Rfl:    rosuvastatin (CRESTOR) 10 MG tablet, Take 10 mg by mouth at bedtime. , Disp: , Rfl:    RYBELSUS 14 MG TABS, TAKE ONE TABLET BY MOUTH DAILY 30 MINUTES BEFORE eating, Disp: 30 tablet, Rfl: 5  Social History   Tobacco Use  Smoking Status Former   Packs/day: 0.50   Years: 45.00   Additional pack years: 0.00   Total pack years: 22.50   Types: Cigarettes   Quit date: 04/05/2017   Years since quitting: 5.2  Smokeless Tobacco Never    Allergies  Allergen Reactions   Iodine Nausea And Vomiting    Occurred with last two CT scans   Objective:   Vitals:   07/11/22 1005  BP: (!) 144/87  Pulse: 85   There is no height or weight on file to calculate BMI. Constitutional Well developed. Well nourished.  Vascular Dorsalis pedis pulses palpable bilaterally. Posterior tibial pulses palpable bilaterally. Capillary refill normal to all digits.  No cyanosis or clubbing noted. Pedal hair growth normal.  Neurologic Normal speech. Oriented to person, place, and time. Epicritic sensation to light  touch grossly present bilaterally.  Dermatologic Painful ingrowing nail at lateral nail borders of the hallux nail left. No other open wounds. No skin lesions.  Orthopedic: Normal joint ROM without pain or crepitus bilaterally. No visible deformities. No bony tenderness.   Radiographs: None Assessment:   1. Ingrown left big toenail    Plan:  Patient was evaluated and treated and all questions answered.  Ingrown Nail, left -Patient elects to proceed with minor surgery to remove ingrown toenail removal today. Consent reviewed and signed by patient. -Ingrown nail excised. See procedure note. -Educated on post-procedure care including soaking. Written instructions provided and reviewed. -Patient to  follow up in 2 weeks for nail check.  Procedure: Excision of Ingrown Toenail Location: Left 1st toe lateral nail borders. Anesthesia: Lidocaine 1% plain; 1.5 mL and Marcaine 0.5% plain; 1.5 mL, digital block. Skin Prep: Betadine. Dressing: Silvadene; telfa; dry, sterile, compression dressing. Technique: Following skin prep, the toe was exsanguinated and a tourniquet was secured at the base of the toe. The affected nail border was freed, split with a nail splitter, and excised. Chemical matrixectomy was then performed with phenol and irrigated out with alcohol. The tourniquet was then removed and sterile dressing applied. Disposition: Patient tolerated procedure well. Patient to return in 2 weeks for follow-up.   No follow-ups on file.

## 2022-07-23 DIAGNOSIS — H2513 Age-related nuclear cataract, bilateral: Secondary | ICD-10-CM | POA: Diagnosis not present

## 2022-07-23 DIAGNOSIS — H40053 Ocular hypertension, bilateral: Secondary | ICD-10-CM | POA: Diagnosis not present

## 2022-08-08 ENCOUNTER — Other Ambulatory Visit: Payer: Self-pay | Admitting: Thoracic Surgery (Cardiothoracic Vascular Surgery)

## 2022-08-08 DIAGNOSIS — I7121 Aneurysm of the ascending aorta, without rupture: Secondary | ICD-10-CM

## 2022-09-12 ENCOUNTER — Telehealth: Payer: Self-pay

## 2022-09-12 NOTE — Telephone Encounter (Signed)
Patient would like to know why she is taking 3 different diabetic medications.

## 2022-09-12 NOTE — Telephone Encounter (Signed)
Please schedule patient with Dr. Erroll Luna once his schedule opens please

## 2022-09-16 ENCOUNTER — Other Ambulatory Visit: Payer: Self-pay | Admitting: Endocrinology

## 2022-09-16 DIAGNOSIS — E1165 Type 2 diabetes mellitus with hyperglycemia: Secondary | ICD-10-CM
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
## 2022-09-23 DIAGNOSIS — N1831 Chronic kidney disease, stage 3a: Secondary | ICD-10-CM | POA: Diagnosis not present

## 2022-09-23 DIAGNOSIS — I5189 Other ill-defined heart diseases: Secondary | ICD-10-CM | POA: Diagnosis not present

## 2022-09-23 DIAGNOSIS — E1122 Type 2 diabetes mellitus with diabetic chronic kidney disease: Secondary | ICD-10-CM | POA: Diagnosis not present

## 2022-09-23 DIAGNOSIS — E7800 Pure hypercholesterolemia, unspecified: Secondary | ICD-10-CM | POA: Diagnosis not present

## 2022-09-23 DIAGNOSIS — I10 Essential (primary) hypertension: Secondary | ICD-10-CM | POA: Diagnosis not present

## 2022-09-23 DIAGNOSIS — Z5181 Encounter for therapeutic drug level monitoring: Secondary | ICD-10-CM | POA: Diagnosis not present

## 2022-09-23 DIAGNOSIS — I7121 Aneurysm of the ascending aorta, without rupture: Secondary | ICD-10-CM | POA: Diagnosis not present

## 2022-09-23 DIAGNOSIS — G4730 Sleep apnea, unspecified: Secondary | ICD-10-CM | POA: Diagnosis not present

## 2022-09-30 ENCOUNTER — Ambulatory Visit (INDEPENDENT_AMBULATORY_CARE_PROVIDER_SITE_OTHER): Payer: PPO | Admitting: Physician Assistant

## 2022-09-30 ENCOUNTER — Ambulatory Visit
Admission: RE | Admit: 2022-09-30 | Discharge: 2022-09-30 | Disposition: A | Discharge status: Home / Self Care | Payer: PPO | Source: Ambulatory Visit | Attending: Thoracic Surgery (Cardiothoracic Vascular Surgery) | Admitting: Thoracic Surgery (Cardiothoracic Vascular Surgery)

## 2022-09-30 VITALS — BP 134/86 | HR 91 | Resp 18 | Ht 61.0 in | Wt 173.0 lb

## 2022-09-30 DIAGNOSIS — I7121 Aneurysm of the ascending aorta, without rupture: Secondary | ICD-10-CM

## 2022-09-30 MED ORDER — IOPAMIDOL (ISOVUE-370) INJECTION 76%
75.0000 mL | Freq: Once | INTRAVENOUS | Status: AC | PRN
  Administered 2022-09-30: 75 mL via INTRAVENOUS

## 2022-09-30 NOTE — Patient Instructions (Signed)
Risk Modification in those with ascending thoracic aortic aneurysm:  Continue good control of blood pressure (prefer SBP 130/80 or less)-continue Lisinopril and HCTZ  2. Avoid fluoroquinolone antibiotics (I.e Ciprofloxacin, Avelox, Levofloxacin, Ofloxacin)  3.  Use of statin (to decrease cardiovascular risk)-continue Crestor (Rosuvastatin)  4.  Exercise and activity limitations is individualized, but in general, contact sports are to be avoided and one should avoid heavy lifting (defined as half of ideal body weight) and exercises involving sustained Valsalva maneuver.  5. Counseling for those suspected of having genetically mediated disease. First-degree relatives of those with TAA disease should be screened as well as those who have a connective tissue disease (I.e  ith Marfan syndrome, Ehlers-Danlos syndrome,  and Loeys-Dietz syndrome) or a  bicuspid aortic valve,have an increased risk for complications related to TAA. There is no history of connective tissue disease.Echo done in June 2019 showed trivial aortic regurgitation   6. Patient with a remote history of tobacco abuse (quit 2019)

## 2022-10-01 ENCOUNTER — Encounter: Payer: Self-pay | Admitting: Physician Assistant

## 2022-10-04 ENCOUNTER — Other Ambulatory Visit: Payer: Self-pay

## 2022-10-04 ENCOUNTER — Inpatient Hospital Stay
Admission: EM | Admit: 2022-10-04 | Discharge: 2022-10-07 | DRG: 439 | Disposition: A | Discharge status: Home / Self Care | Payer: PPO | Attending: Hospitalist | Admitting: Hospitalist

## 2022-10-04 ENCOUNTER — Emergency Department: Payer: PPO

## 2022-10-04 ENCOUNTER — Encounter: Payer: Self-pay | Admitting: Emergency Medicine

## 2022-10-04 DIAGNOSIS — Z91041 Radiographic dye allergy status: Secondary | ICD-10-CM

## 2022-10-04 DIAGNOSIS — J4489 Other specified chronic obstructive pulmonary disease: Secondary | ICD-10-CM | POA: Diagnosis present

## 2022-10-04 DIAGNOSIS — K853 Drug induced acute pancreatitis without necrosis or infection: Principal | ICD-10-CM | POA: Diagnosis present

## 2022-10-04 DIAGNOSIS — Z79899 Other long term (current) drug therapy: Secondary | ICD-10-CM

## 2022-10-04 DIAGNOSIS — I719 Aortic aneurysm of unspecified site, without rupture: Secondary | ICD-10-CM | POA: Diagnosis not present

## 2022-10-04 DIAGNOSIS — I2510 Atherosclerotic heart disease of native coronary artery without angina pectoris: Secondary | ICD-10-CM | POA: Diagnosis present

## 2022-10-04 DIAGNOSIS — I77819 Aortic ectasia, unspecified site: Secondary | ICD-10-CM | POA: Diagnosis present

## 2022-10-04 DIAGNOSIS — I77810 Thoracic aortic ectasia: Secondary | ICD-10-CM | POA: Diagnosis present

## 2022-10-04 DIAGNOSIS — Z800 Family history of malignant neoplasm of digestive organs: Secondary | ICD-10-CM

## 2022-10-04 DIAGNOSIS — J9819 Other pulmonary collapse: Secondary | ICD-10-CM | POA: Diagnosis present

## 2022-10-04 DIAGNOSIS — I2721 Secondary pulmonary arterial hypertension: Secondary | ICD-10-CM | POA: Diagnosis present

## 2022-10-04 DIAGNOSIS — Y92009 Unspecified place in unspecified non-institutional (private) residence as the place of occurrence of the external cause: Secondary | ICD-10-CM

## 2022-10-04 DIAGNOSIS — K852 Alcohol induced acute pancreatitis without necrosis or infection: Secondary | ICD-10-CM | POA: Diagnosis not present

## 2022-10-04 DIAGNOSIS — R1013 Epigastric pain: Secondary | ICD-10-CM | POA: Diagnosis not present

## 2022-10-04 DIAGNOSIS — I10 Essential (primary) hypertension: Secondary | ICD-10-CM | POA: Diagnosis present

## 2022-10-04 DIAGNOSIS — Z8249 Family history of ischemic heart disease and other diseases of the circulatory system: Secondary | ICD-10-CM

## 2022-10-04 DIAGNOSIS — Z87891 Personal history of nicotine dependence: Secondary | ICD-10-CM

## 2022-10-04 DIAGNOSIS — K859 Acute pancreatitis without necrosis or infection, unspecified: Secondary | ICD-10-CM | POA: Diagnosis not present

## 2022-10-04 DIAGNOSIS — Z833 Family history of diabetes mellitus: Secondary | ICD-10-CM

## 2022-10-04 DIAGNOSIS — J449 Chronic obstructive pulmonary disease, unspecified: Secondary | ICD-10-CM | POA: Diagnosis present

## 2022-10-04 DIAGNOSIS — I4510 Unspecified right bundle-branch block: Secondary | ICD-10-CM | POA: Diagnosis present

## 2022-10-04 DIAGNOSIS — Z801 Family history of malignant neoplasm of trachea, bronchus and lung: Secondary | ICD-10-CM

## 2022-10-04 DIAGNOSIS — E785 Hyperlipidemia, unspecified: Secondary | ICD-10-CM | POA: Diagnosis present

## 2022-10-04 DIAGNOSIS — E7800 Pure hypercholesterolemia, unspecified: Secondary | ICD-10-CM | POA: Diagnosis present

## 2022-10-04 DIAGNOSIS — R079 Chest pain, unspecified: Secondary | ICD-10-CM | POA: Diagnosis not present

## 2022-10-04 DIAGNOSIS — T50995A Adverse effect of other drugs, medicaments and biological substances, initial encounter: Secondary | ICD-10-CM | POA: Diagnosis present

## 2022-10-04 DIAGNOSIS — I440 Atrioventricular block, first degree: Secondary | ICD-10-CM | POA: Diagnosis present

## 2022-10-04 DIAGNOSIS — E119 Type 2 diabetes mellitus without complications: Secondary | ICD-10-CM

## 2022-10-04 DIAGNOSIS — Z7984 Long term (current) use of oral hypoglycemic drugs: Secondary | ICD-10-CM

## 2022-10-04 DIAGNOSIS — J432 Centrilobular emphysema: Secondary | ICD-10-CM | POA: Diagnosis present

## 2022-10-04 DIAGNOSIS — Z888 Allergy status to other drugs, medicaments and biological substances status: Secondary | ICD-10-CM

## 2022-10-04 LAB — COMPREHENSIVE METABOLIC PANEL
ALT: 13 U/L (ref 0–44)
AST: 21 U/L (ref 15–41)
Albumin: 4.3 g/dL (ref 3.5–5.0)
Alkaline Phosphatase: 45 U/L (ref 38–126)
Anion gap: 11 (ref 5–15)
BUN: 14 mg/dL (ref 8–23)
CO2: 25 mmol/L (ref 22–32)
Calcium: 9.2 mg/dL (ref 8.9–10.3)
Chloride: 99 mmol/L (ref 98–111)
Creatinine, Ser: 0.93 mg/dL (ref 0.44–1.00)
GFR, Estimated: >60 mL/min (ref >60)
Glucose, Bld: 148 mg/dL — ABNORMAL HIGH (ref 70–99)
Potassium: 4.4 mmol/L (ref 3.5–5.1)
Sodium: 135 mmol/L (ref 135–145)
Total Bilirubin: 0.9 mg/dL (ref 0.3–1.2)
Total Protein: 8.5 g/dL — ABNORMAL HIGH (ref 6.5–8.1)

## 2022-10-04 LAB — TROPONIN I (HIGH SENSITIVITY): Troponin I (High Sensitivity): 6 ng/L (ref <18)

## 2022-10-04 LAB — CBC WITH DIFFERENTIAL/PLATELET
Abs Immature Granulocytes: 0.03 10*3/uL (ref 0.00–0.07)
Basophils Absolute: 0 10*3/uL (ref 0.0–0.1)
Basophils Relative: 0 %
Eosinophils Absolute: 0.1 10*3/uL (ref 0.0–0.5)
Eosinophils Relative: 1 %
HCT: 52.4 % — ABNORMAL HIGH (ref 36.0–46.0)
Hemoglobin: 15.8 g/dL — ABNORMAL HIGH (ref 12.0–15.0)
Immature Granulocytes: 1 %
Lymphocytes Relative: 26 %
Lymphs Abs: 1.5 10*3/uL (ref 0.7–4.0)
MCH: 28.7 pg (ref 26.0–34.0)
MCHC: 30.2 g/dL (ref 30.0–36.0)
MCV: 95.1 fL (ref 80.0–100.0)
Monocytes Absolute: 0.3 10*3/uL (ref 0.1–1.0)
Monocytes Relative: 5 %
Neutro Abs: 4 10*3/uL (ref 1.7–7.7)
Neutrophils Relative %: 67 %
Platelets: 183 10*3/uL (ref 150–400)
RBC: 5.51 MIL/uL — ABNORMAL HIGH (ref 3.87–5.11)
RDW: 13.2 % (ref 11.5–15.5)
WBC: 5.8 10*3/uL (ref 4.0–10.5)
nRBC: 0 % (ref 0.0–0.2)

## 2022-10-04 NOTE — ED Triage Notes (Signed)
  Patient comes in with epigastric pain that started yesterday morning after she woke up.  Patient states she has not taken anything OTC but states it was worse after eating a meal around 2100.  Denies any reflux symptoms.  Denies any SOB/diaphoresis.  Pain 8.5/10, pressure in epigastric area.  No cardiac hx.

## 2022-10-05 ENCOUNTER — Emergency Department: Payer: PPO

## 2022-10-05 DIAGNOSIS — J4489 Other specified chronic obstructive pulmonary disease: Secondary | ICD-10-CM | POA: Diagnosis not present

## 2022-10-05 DIAGNOSIS — Z7984 Long term (current) use of oral hypoglycemic drugs: Secondary | ICD-10-CM | POA: Diagnosis not present

## 2022-10-05 DIAGNOSIS — Z801 Family history of malignant neoplasm of trachea, bronchus and lung: Secondary | ICD-10-CM | POA: Diagnosis not present

## 2022-10-05 DIAGNOSIS — J9819 Other pulmonary collapse: Secondary | ICD-10-CM | POA: Diagnosis not present

## 2022-10-05 DIAGNOSIS — K859 Acute pancreatitis without necrosis or infection, unspecified: Secondary | ICD-10-CM

## 2022-10-05 DIAGNOSIS — E119 Type 2 diabetes mellitus without complications: Secondary | ICD-10-CM | POA: Diagnosis not present

## 2022-10-05 DIAGNOSIS — Y92009 Unspecified place in unspecified non-institutional (private) residence as the place of occurrence of the external cause: Secondary | ICD-10-CM | POA: Diagnosis not present

## 2022-10-05 DIAGNOSIS — I2510 Atherosclerotic heart disease of native coronary artery without angina pectoris: Secondary | ICD-10-CM | POA: Diagnosis not present

## 2022-10-05 DIAGNOSIS — I440 Atrioventricular block, first degree: Secondary | ICD-10-CM | POA: Diagnosis not present

## 2022-10-05 DIAGNOSIS — K853 Drug induced acute pancreatitis without necrosis or infection: Secondary | ICD-10-CM | POA: Diagnosis not present

## 2022-10-05 DIAGNOSIS — I2721 Secondary pulmonary arterial hypertension: Secondary | ICD-10-CM | POA: Diagnosis not present

## 2022-10-05 DIAGNOSIS — Z87891 Personal history of nicotine dependence: Secondary | ICD-10-CM | POA: Diagnosis not present

## 2022-10-05 DIAGNOSIS — I77810 Thoracic aortic ectasia: Secondary | ICD-10-CM

## 2022-10-05 DIAGNOSIS — I10 Essential (primary) hypertension: Secondary | ICD-10-CM | POA: Diagnosis not present

## 2022-10-05 DIAGNOSIS — K8689 Other specified diseases of pancreas: Secondary | ICD-10-CM | POA: Diagnosis not present

## 2022-10-05 DIAGNOSIS — Z800 Family history of malignant neoplasm of digestive organs: Secondary | ICD-10-CM | POA: Diagnosis not present

## 2022-10-05 DIAGNOSIS — I4510 Unspecified right bundle-branch block: Secondary | ICD-10-CM | POA: Diagnosis not present

## 2022-10-05 DIAGNOSIS — Z79899 Other long term (current) drug therapy: Secondary | ICD-10-CM | POA: Diagnosis not present

## 2022-10-05 DIAGNOSIS — J432 Centrilobular emphysema: Secondary | ICD-10-CM | POA: Diagnosis not present

## 2022-10-05 DIAGNOSIS — E785 Hyperlipidemia, unspecified: Secondary | ICD-10-CM | POA: Diagnosis not present

## 2022-10-05 DIAGNOSIS — Z833 Family history of diabetes mellitus: Secondary | ICD-10-CM | POA: Diagnosis not present

## 2022-10-05 DIAGNOSIS — Z91041 Radiographic dye allergy status: Secondary | ICD-10-CM | POA: Diagnosis not present

## 2022-10-05 DIAGNOSIS — Z888 Allergy status to other drugs, medicaments and biological substances status: Secondary | ICD-10-CM | POA: Diagnosis not present

## 2022-10-05 DIAGNOSIS — Z8249 Family history of ischemic heart disease and other diseases of the circulatory system: Secondary | ICD-10-CM | POA: Diagnosis not present

## 2022-10-05 DIAGNOSIS — E7800 Pure hypercholesterolemia, unspecified: Secondary | ICD-10-CM | POA: Diagnosis not present

## 2022-10-05 DIAGNOSIS — R911 Solitary pulmonary nodule: Secondary | ICD-10-CM | POA: Diagnosis not present

## 2022-10-05 DIAGNOSIS — I77819 Aortic ectasia, unspecified site: Secondary | ICD-10-CM | POA: Diagnosis not present

## 2022-10-05 DIAGNOSIS — R109 Unspecified abdominal pain: Secondary | ICD-10-CM | POA: Diagnosis not present

## 2022-10-05 DIAGNOSIS — T50995A Adverse effect of other drugs, medicaments and biological substances, initial encounter: Secondary | ICD-10-CM | POA: Diagnosis not present

## 2022-10-05 HISTORY — DX: Acute pancreatitis without necrosis or infection, unspecified: K85.90

## 2022-10-05 LAB — CREATININE, SERUM
Creatinine, Ser: 0.86 mg/dL (ref 0.44–1.00)
GFR, Estimated: >60 mL/min (ref >60)

## 2022-10-05 LAB — GLUCOSE, CAPILLARY
Glucose-Capillary: 129 mg/dL — ABNORMAL HIGH (ref 70–99)
Glucose-Capillary: 174 mg/dL — ABNORMAL HIGH (ref 70–99)
Glucose-Capillary: 121 mg/dL — ABNORMAL HIGH (ref 70–99)
Glucose-Capillary: 161 mg/dL — ABNORMAL HIGH (ref 70–99)

## 2022-10-05 LAB — CBC
HCT: 46.1 % — ABNORMAL HIGH (ref 36.0–46.0)
Hemoglobin: 14.5 g/dL (ref 12.0–15.0)
MCH: 28.7 pg (ref 26.0–34.0)
MCHC: 31.5 g/dL (ref 30.0–36.0)
MCV: 91.3 fL (ref 80.0–100.0)
Platelets: 192 10*3/uL (ref 150–400)
RBC: 5.05 MIL/uL (ref 3.87–5.11)
RDW: 13.3 % (ref 11.5–15.5)
WBC: 5.3 10*3/uL (ref 4.0–10.5)
nRBC: 0 % (ref 0.0–0.2)

## 2022-10-05 LAB — LIPASE, BLOOD: Lipase: 66 U/L — ABNORMAL HIGH (ref 11–51)

## 2022-10-05 LAB — HEMOGLOBIN A1C
Hgb A1c MFr Bld: 7.3 % — ABNORMAL HIGH (ref 4.8–5.6)
Mean Plasma Glucose: 162.81 mg/dL

## 2022-10-05 LAB — TROPONIN I (HIGH SENSITIVITY): Troponin I (High Sensitivity): 4 ng/L (ref <18)

## 2022-10-05 MED ORDER — ALBUTEROL SULFATE (2.5 MG/3ML) 0.083% IN NEBU: 2.5000 mg | INHALATION_SOLUTION | Freq: Four times a day (QID) | RESPIRATORY_TRACT | Status: DC | PRN

## 2022-10-05 MED ORDER — ROSUVASTATIN CALCIUM 10 MG PO TABS
10.0000 mg | ORAL_TABLET | Freq: Every day | ORAL | Status: DC
  Administered 2022-10-05 – 2022-10-06 (×2): 10 mg via ORAL
  Filled 2022-10-05 (×2): qty 1

## 2022-10-05 MED ORDER — ONDANSETRON HCL 4 MG/2ML IJ SOLN
4.0000 mg | Freq: Once | INTRAMUSCULAR | Status: AC
  Administered 2022-10-05: 4 mg via INTRAVENOUS
  Filled 2022-10-05: qty 2

## 2022-10-05 MED ORDER — HYDROCODONE-ACETAMINOPHEN 5-325 MG PO TABS
1.0000 | ORAL_TABLET | ORAL | Status: DC | PRN
  Administered 2022-10-06: 1 via ORAL
  Filled 2022-10-05: qty 1

## 2022-10-05 MED ORDER — ASPIRIN 81 MG PO CHEW
81.0000 mg | CHEWABLE_TABLET | Freq: Every day | ORAL | Status: DC
  Administered 2022-10-05 – 2022-10-06 (×2): 81 mg via ORAL
  Filled 2022-10-05 (×2): qty 1

## 2022-10-05 MED ORDER — ONDANSETRON HCL 4 MG PO TABS: 4.0000 mg | ORAL_TABLET | Freq: Four times a day (QID) | ORAL | Status: DC | PRN

## 2022-10-05 MED ORDER — MORPHINE SULFATE (PF) 4 MG/ML IV SOLN
4.0000 mg | Freq: Once | INTRAVENOUS | Status: AC
  Administered 2022-10-05: 4 mg via INTRAVENOUS
  Filled 2022-10-05: qty 1

## 2022-10-05 MED ORDER — SODIUM CHLORIDE 0.9 % IV SOLN: Freq: Once | INTRAVENOUS | Status: AC

## 2022-10-05 MED ORDER — ENOXAPARIN SODIUM 40 MG/0.4ML IJ SOSY
0.5000 mg/kg | PREFILLED_SYRINGE | INTRAMUSCULAR | Status: DC
  Administered 2022-10-05 – 2022-10-07 (×3): 40 mg via SUBCUTANEOUS
  Filled 2022-10-05 (×3): qty 0.4

## 2022-10-05 MED ORDER — ONDANSETRON HCL 4 MG/2ML IJ SOLN
4.0000 mg | Freq: Four times a day (QID) | INTRAMUSCULAR | Status: DC | PRN
  Administered 2022-10-05: 4 mg via INTRAVENOUS
  Filled 2022-10-05 (×2): qty 2

## 2022-10-05 MED ORDER — LACTATED RINGERS IV SOLN: INTRAVENOUS | Status: DC

## 2022-10-05 MED ORDER — PANTOPRAZOLE 80MG IVPB - SIMPLE MED
80.0000 mg | Freq: Once | INTRAVENOUS | Status: AC
  Administered 2022-10-05: 80 mg via INTRAVENOUS
  Filled 2022-10-05: qty 100

## 2022-10-05 MED ORDER — DIPHENHYDRAMINE HCL 50 MG/ML IJ SOLN
12.5000 mg | Freq: Once | INTRAMUSCULAR | Status: AC
  Administered 2022-10-05: 12.5 mg via INTRAVENOUS
  Filled 2022-10-05: qty 1

## 2022-10-05 MED ORDER — ACETAMINOPHEN 325 MG PO TABS: 650.0000 mg | ORAL_TABLET | Freq: Four times a day (QID) | ORAL | Status: DC | PRN

## 2022-10-05 MED ORDER — INSULIN ASPART 100 UNIT/ML IJ SOLN: 0.0000 [IU] | Freq: Every day | INTRAMUSCULAR | Status: DC

## 2022-10-05 MED ORDER — ACETAMINOPHEN 650 MG RE SUPP: 650.0000 mg | Freq: Four times a day (QID) | RECTAL | Status: DC | PRN

## 2022-10-05 MED ORDER — INSULIN ASPART 100 UNIT/ML IJ SOLN
0.0000 [IU] | Freq: Three times a day (TID) | INTRAMUSCULAR | Status: DC
  Administered 2022-10-05 (×2): 2 [IU] via SUBCUTANEOUS
  Administered 2022-10-05: 3 [IU] via SUBCUTANEOUS
  Administered 2022-10-06: 2 [IU] via SUBCUTANEOUS
  Filled 2022-10-05 (×4): qty 1

## 2022-10-05 MED ORDER — MORPHINE SULFATE (PF) 2 MG/ML IV SOLN
2.0000 mg | INTRAVENOUS | Status: DC | PRN
  Administered 2022-10-05: 2 mg via INTRAVENOUS
  Filled 2022-10-05: qty 1

## 2022-10-05 MED ORDER — IOHEXOL 350 MG/ML SOLN
100.0000 mL | Freq: Once | INTRAVENOUS | Status: AC | PRN
  Administered 2022-10-05: 100 mL via INTRAVENOUS

## 2022-10-05 MED ORDER — LISINOPRIL 10 MG PO TABS
10.0000 mg | ORAL_TABLET | Freq: Every day | ORAL | Status: DC
  Administered 2022-10-05 – 2022-10-07 (×3): 10 mg via ORAL
  Filled 2022-10-05 (×3): qty 1

## 2022-10-05 NOTE — Assessment & Plan Note (Signed)
Not acutely exacerbated.  Albuterol as needed 

## 2022-10-05 NOTE — Assessment & Plan Note (Signed)
Sliding scale insulin coverage Hold semaglutide, Invokana and metformin Sliding scale coverage Patient is followed by endocrinology
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
## 2022-10-05 NOTE — Assessment & Plan Note (Addendum)
Follows with cardiothoracic surgery every 6 months.  Last seen 7/29

## 2022-10-05 NOTE — ED Provider Notes (Signed)
[facility] Provider Note    Event Date/Time   First MD Initiated Contact with Patient 10/04/22 2359     (approximate)   History   Abdominal Pain   HPI  Angel Tanner is a 83 y.o. female with extensive past medical history including history of ascending aortic aneurysm here with severe epigastric pain.  The patient states that starting last night, she developed gradual onset progressive worsening aching, cramping, epigastric abdominal pain.  Over the last 24 hours, this is progressively worsened.  It is severe, worse with any kind ofeating.  She had some mild nausea but no vomiting.  No significant radiation.     Physical Exam   Triage Vital Signs: ED Triage Vitals  Encounter Vitals Group     BP 10/04/22 2235 (!) 148/87     Systolic BP Percentile --      Diastolic BP Percentile --      Pulse Rate 10/04/22 2235 89     Resp 10/04/22 2235 20     Temp 10/04/22 2235 98.4 F (36.9 C)     Temp Source 10/04/22 2235 Oral     SpO2 10/04/22 2235 94 %     Weight 10/04/22 2236 173 lb (78.5 kg)     Height 10/04/22 2236 5\' 1"  (1.549 m)     Head Circumference --      Peak Flow --      Pain Score 10/04/22 2236 9     Pain Loc --      Pain Education --      Exclude from Growth Chart --     Most recent vital signs: Vitals:   10/05/22 0400 10/05/22 0515  BP: 128/75 (!) 149/79  Pulse: 88 90  Resp:  18  Temp:  98.7 F (37.1 C)  SpO2: 91% 96%     General: Awake, no distress.  CV:  Good peripheral perfusion.  Regular rate and rhythm. Resp:  Normal work of breathing.  Lungs clear to auscultation bilaterally. Abd:  No distention.  Moderate epigastric tenderness.  No rebound or guarding. Other:  Pulses 2+ and symmetric   ED Results / Procedures / Treatments   Labs (all labs ordered are listed, but only abnormal results are displayed) Labs Reviewed  COMPREHENSIVE METABOLIC PANEL - Abnormal; Notable for the following components:      Result Value    Glucose, Bld 148 (*)    Total Protein 8.5 (*)    All other components within normal limits  CBC WITH DIFFERENTIAL/PLATELET - Abnormal; Notable for the following components:   RBC 5.51 (*)    Hemoglobin 15.8 (*)    HCT 52.4 (*)    All other components within normal limits  URINALYSIS, ROUTINE W REFLEX MICROSCOPIC - Abnormal; Notable for the following components:   Color, Urine YELLOW (*)    APPearance CLEAR (*)    Specific Gravity, Urine 1.041 (*)    Glucose, UA >=500 (*)    All other components within normal limits  LIPASE, BLOOD - Abnormal; Notable for the following components:   Lipase 66 (*)    All other components within normal limits  CBC  CREATININE, SERUM  HEMOGLOBIN A1C  TROPONIN I (HIGH SENSITIVITY)  TROPONIN I (HIGH SENSITIVITY)     EKG Normal sinus rhythm, trickle rate 87.  PR 244, QRS 136, QTc 46.  No acute ST elevations or depressions.  Right bundle branch block.  Nonspecific changes, no acute ST elevations.   RADIOLOGY Chest x-ray:  Chronic right middle lobe collapse CT angio: moderate CAD, mild emphysema, inflammatory stranding around pancreas c/w pancreatitis, ? duodenitis   I also independently reviewed and agree with radiologist interpretations.   PROCEDURES:  Critical Care performed: No  .1-3 Lead EKG Interpretation  Performed by: Shaune Pollack, MD Authorized by: Shaune Pollack, MD     Interpretation: normal     ECG rate:  80-90   ECG rate assessment: normal     Rhythm: sinus rhythm     Ectopy: none     Conduction: normal   Comments:     Indication: Chest pain   MEDICATIONS ORDERED IN ED: Medications  aspirin chewable tablet 81 mg (has no administration in time range)  lisinopril (ZESTRIL) tablet 10 mg (has no administration in time range)  rosuvastatin (CRESTOR) tablet 10 mg (has no administration in time range)  enoxaparin (LOVENOX) injection 40 mg (has no administration in time range)  acetaminophen (TYLENOL) tablet 650 mg (has no  administration in time range)    Or  acetaminophen (TYLENOL) suppository 650 mg (has no administration in time range)  ondansetron (ZOFRAN) tablet 4 mg (has no administration in time range)    Or  ondansetron (ZOFRAN) injection 4 mg (has no administration in time range)  insulin aspart (novoLOG) injection 0-15 Units (has no administration in time range)  insulin aspart (novoLOG) injection 0-5 Units (has no administration in time range)  lactated ringers infusion ( Intravenous New Bag/Given 10/05/22 0544)  morphine (PF) 2 MG/ML injection 2 mg (2 mg Intravenous Incomplete 10/05/22 0448)  HYDROcodone-acetaminophen (NORCO/VICODIN) 5-325 MG per tablet 1 tablet (has no administration in time range)  albuterol (PROVENTIL) (2.5 MG/3ML) 0.083% nebulizer solution 2.5 mg (has no administration in time range)  morphine (PF) 4 MG/ML injection 4 mg (4 mg Intravenous Given 10/05/22 0024)  ondansetron (ZOFRAN) injection 4 mg (4 mg Intravenous Given 10/05/22 0024)  ondansetron (ZOFRAN) injection 4 mg (4 mg Intravenous Given 10/05/22 0046)  diphenhydrAMINE (BENADRYL) injection 12.5 mg (12.5 mg Intravenous Given 10/05/22 0046)  iohexol (OMNIPAQUE) 350 MG/ML injection 100 mL (100 mLs Intravenous Contrast Given 10/05/22 0118)  pantoprazole (PROTONIX) 80 mg /NS 100 mL IVPB (0 mg Intravenous Stopped 10/05/22 0340)  0.9 %  sodium chloride infusion (0 mLs Intravenous Stopped 10/05/22 0531)     IMPRESSION / MDM / ASSESSMENT AND PLAN / ED COURSE  I reviewed the triage vital signs and the nursing notes.                              Differential diagnosis includes, but is not limited to, pancreatitis, gastritis, cholecystitis, AAA, dissection, PE, PNA, atelectasis  Patient's presentation is most consistent with acute presentation with potential threat to life or bodily function.  The patient is on the cardiac monitor to evaluate for evidence of arrhythmia and/or significant heart rate changes  82 yo F with PMHx HTN, HLD, COPD,  DM, here with abdominal pain. Suspect acute pancreatitis, likely 2/2 her diabetic medications. Lipase minimally elevated but CT obtained, and is c/w acute pancreatitis. Pt has a h/o ascending aneurysm so Angio obtained, fortunately shows no dissection or worsening of aneurysm. CMP normal, LFTs normal, renal function at baseline. CBC shows no anemia, leukocytosis.   Will admit for pancreatitis.   FINAL CLINICAL IMPRESSION(S) / ED DIAGNOSES   Final diagnoses:  Drug-induced acute pancreatitis without infection or necrosis     Rx / DC Orders   ED Discharge Orders  None        Note:  This document was prepared using Dragon voice recognition software and may include unintentional dictation errors.   Shaune Pollack, MD 10/05/22 [phone]

## 2022-10-05 NOTE — Assessment & Plan Note (Signed)
History of pancreatitis related to Januvia 2010 Suspect medication related Hold current diabetic meds Clear liquid diet IV pain meds, antiemetics

## 2022-10-05 NOTE — Assessment & Plan Note (Signed)
Continue lisinopril

## 2022-10-05 NOTE — ED Notes (Signed)
Nd back with stand by assist.

## 2022-10-05 NOTE — Plan of Care (Signed)

## 2022-10-06 DIAGNOSIS — K859 Acute pancreatitis without necrosis or infection, unspecified: Secondary | ICD-10-CM | POA: Diagnosis not present

## 2022-10-06 LAB — BASIC METABOLIC PANEL WITH GFR
Anion gap: 10 (ref 5–15)
BUN: 9 mg/dL (ref 8–23)
CO2: 26 mmol/L (ref 22–32)
Calcium: 8.8 mg/dL — ABNORMAL LOW (ref 8.9–10.3)
Chloride: 102 mmol/L (ref 98–111)
Creatinine, Ser: 0.65 mg/dL (ref 0.44–1.00)
GFR, Estimated: >60 mL/min (ref >60)
Glucose, Bld: 101 mg/dL — ABNORMAL HIGH (ref 70–99)
Potassium: 3.8 mmol/L (ref 3.5–5.1)
Sodium: 138 mmol/L (ref 135–145)

## 2022-10-06 LAB — CBC
HCT: 43.6 % (ref 36.0–46.0)
Hemoglobin: 13.7 g/dL (ref 12.0–15.0)
MCH: 28.8 pg (ref 26.0–34.0)
MCHC: 31.4 g/dL (ref 30.0–36.0)
MCV: 91.8 fL (ref 80.0–100.0)
Platelets: 191 10*3/uL (ref 150–400)
RBC: 4.75 MIL/uL (ref 3.87–5.11)
RDW: 13.2 % (ref 11.5–15.5)
WBC: 4.8 10*3/uL (ref 4.0–10.5)
nRBC: 0 % (ref 0.0–0.2)

## 2022-10-06 LAB — TRIGLYCERIDES: Triglycerides: 54 mg/dL (ref <150)

## 2022-10-06 LAB — GLUCOSE, CAPILLARY
Glucose-Capillary: 95 mg/dL (ref 70–99)
Glucose-Capillary: 113 mg/dL — ABNORMAL HIGH (ref 70–99)
Glucose-Capillary: 139 mg/dL — ABNORMAL HIGH (ref 70–99)
Glucose-Capillary: 152 mg/dL — ABNORMAL HIGH (ref 70–99)

## 2022-10-06 LAB — MAGNESIUM: Magnesium: 2.1 mg/dL (ref 1.7–2.4)

## 2022-10-06 MED ORDER — LACTATED RINGERS IV SOLN: INTRAVENOUS | Status: DC

## 2022-10-06 MED ORDER — HYDROCHLOROTHIAZIDE 12.5 MG PO TABS
12.5000 mg | ORAL_TABLET | Freq: Every morning | ORAL | Status: DC
  Administered 2022-10-06 – 2022-10-07 (×2): 12.5 mg via ORAL
  Filled 2022-10-06 (×2): qty 1

## 2022-10-06 MED ORDER — POLYETHYLENE GLYCOL 3350 17 G PO PACK
34.0000 g | PACK | ORAL | Status: AC
  Administered 2022-10-06 (×2): 34 g via ORAL
  Filled 2022-10-06 (×2): qty 2

## 2022-10-06 MED ORDER — POLYETHYLENE GLYCOL 3350 17 G PO PACK
34.0000 g | PACK | Freq: Once | ORAL | Status: AC
  Administered 2022-10-06: 34 g via ORAL
  Filled 2022-10-06: qty 2

## 2022-10-06 NOTE — Progress Notes (Signed)
  PROGRESS NOTE    Angel Tanner  WUJ:[id number] DOB: 07-27-1940 DOA: 10/04/2022 PCP: Renford Dills, MD  [id number]-BB  LOS: 1 day   Brief hospital course:   Assessment & Plan: Angel Tanner is a 82 y.o. female with medical history significant for hypertension, diabetes on Invokana and Rybelsus, hypercholesterolemia, emphysema/COPD, ascending thoracic aortic aneurysm on surveillance, last seen 7/29, history of pancreatitis in 2010 related to Januvia, who presents to the ED with epigastric pain starting the day prior waking her up from sleep.    * PANCREATITIS, ACUTE History of pancreatitis related to Januvia 2010 --CT showed Inflammatory stranding surrounding the uncinate process and head of the pancreas. --no gallstones, no alcohol use, triglyceride normal.  Maybe related to diabetic medications, however, pt has been taking them for a while. Plan: --cont MIVF --low fat diet --Norco PRN for pain --f/u with pt's own outpatient endocrin to determine whether to stop any of her diabetic medications.  Diabetes mellitus, type II (HCC) --on semaglutide, Invokana and metformin PTA, all on hold --BG's have been within goal.   --d/c BG checks  Ascending aorta dilation (HCC) Follows with cardiothoracic surgery every 6 months.  Last seen 7/29  COPD (chronic obstructive pulmonary disease) (HCC) Not acutely exacerbated  Essential hypertension --cont hydrochlorothiazide and Lisinopril   DVT prophylaxis: Lovenox SQ Code Status: Full code  Family Communication: sister updated at bedside today Level of care: Med-Surg Dispo:   The patient is from: home Anticipated d/c is to: home Anticipated d/c date is: tomorrow   Subjective and Interval History:  Pt tolerated diet but still complained of epigastric pain.  Sister worried about no BM since presentation (2 days ago).   Objective: Vitals:   10/05/22 2000 10/06/22 0411 10/06/22 0821 10/06/22 1600  BP: 137/76 (!) 160/85 139/80  136/73  Pulse: 80 95 80 75  Resp: 18 18 18 18   Temp: 97.8 F (36.6 C) 98.9 F (37.2 C) 98.5 F (36.9 C) 98 F (36.7 C)  TempSrc:  Oral Oral   SpO2: 94% 91% 93% 92%  Weight:      Height:        Intake/Output Summary (Last 24 hours) at 10/06/2022 1908 Last data filed at 10/06/2022 0810 Gross per 24 hour  Intake 2153.08 ml  Output --  Net 2153.08 ml   Filed Weights   10/04/22 2236  Weight: 78.5 kg    Examination:   Constitutional: NAD, AAOx3 HEENT: conjunctivae and lids normal, EOMI CV: No cyanosis.   RESP: normal respiratory effort, on RA Neuro: II - XII grossly intact.   Psych: Normal mood and affect.  Appropriate judgement and reason   Data Reviewed: I have personally reviewed labs and imaging studies  Time spent: 35 minutes  Darlin Priestly, MD [facility] If 7PM-7AM, please contact night-coverage 10/06/2022, 7:08 PM

## 2022-10-06 NOTE — Plan of Care (Addendum)
Patient took PRN to successfully treat epigastric pain and at 1800 was treated for nausea with zofran patient reports relief. Was also provided and discussed diet management for pancreatitis. Bed locked in lowest position, call bed in reach. Nursing will continue to monitor.  Problem: Education: Goal: Knowledge of General Education information will improve Description: Including pain rating scale, medication(s)/side effects and non-pharmacologic comfort measures Outcome: Progressing   Problem: Health Behavior/Discharge Planning: Goal: Ability to manage health-related needs will improve Outcome: Progressing   Problem: Clinical Measurements: Goal: Will remain free from infection Outcome: Progressing Goal: Diagnostic test results will improve Outcome: Progressing Goal: Cardiovascular complication will be avoided Outcome: Progressing   Problem: Activity: Goal: Risk for activity intolerance will decrease Outcome: Progressing   Problem: Coping: Goal: Level of anxiety will decrease Outcome: Progressing   Problem: Elimination: Goal: Will not experience complications related to bowel motility Outcome: Progressing   Problem: Skin Integrity: Goal: Risk for impaired skin integrity will decrease Outcome: Progressing

## 2022-10-07 DIAGNOSIS — K859 Acute pancreatitis without necrosis or infection, unspecified: Secondary | ICD-10-CM | POA: Diagnosis not present

## 2022-10-07 MED ORDER — POLYETHYLENE GLYCOL 3350 17 G PO PACK
34.0000 g | PACK | Freq: Once | ORAL | Status: AC
  Administered 2022-10-07: 17 g via ORAL
  Filled 2022-10-07: qty 2

## 2022-10-07 NOTE — Discharge Summary (Signed)
Physician Discharge Summary   Angel Tanner  female DOB: 1940/09/21  ONG:[id number]  PCP: Renford Dills, MD  Admit date: 10/04/2022 Discharge date: 10/07/2022  Admitted From: home Disposition:  home Sister updated at bedside prior to discharge. CODE STATUS: Full code  Discharge Instructions     Diet - low sodium heart healthy   Complete by: As directed       Hospital Course:  For full details, please see H&P, progress notes, consult notes and ancillary notes.  Briefly,  Angel Tanner is a 82 y.o. female with medical history significant for hypertension, diabetes, COPD, ascending thoracic aortic aneurysm on surveillance, history of pancreatitis in 2010 related to Januvia, who presented to the ED with epigastric pain starting the day prior, waking her up from sleep.    * PANCREATITIS, ACUTE History of pancreatitis related to Januvia 2010 --CT showed Inflammatory stranding surrounding the uncinate process and head of the pancreas. --no gallstones, no alcohol use, triglyceride normal.  Maybe related to diabetic medications, however, pt has been taking them for a while. --Pt received MIVF.  Abdominal pain improved and pt tolerating low-fat diet prior to discharge. --f/u with pt's own outpatient endocrin provider to determine whether to stop any of her diabetic medications.   Diabetes mellitus, type II (HCC) --on semaglutide, Invokana and metformin PTA --BG's have been within goal.   --f/u with pt's own outpatient endocrin provider to determine whether to stop any of her diabetic medications.   Ascending aorta dilation (HCC) Follows with cardiothoracic surgery every 6 months.  Last seen 7/29   COPD (chronic obstructive pulmonary disease) (HCC) Not acutely exacerbated   Essential hypertension --cont hydrochlorothiazide and Lisinopril   Discharge Diagnoses:  Principal Problem:   PANCREATITIS, ACUTE Active Problems:   Diabetes mellitus, type II (HCC)   Essential  hypertension   COPD (chronic obstructive pulmonary disease) (HCC)   Ascending aorta dilation (HCC)   Pancreatitis   30 Day Unplanned Readmission Risk Score    Flowsheet Row ED to Hosp-Admission (Current) from 10/04/2022 in [facility] 1C MEDICAL TELEMETRY  30 Day Unplanned Readmission Risk Score (%) 11.74 Filed at 10/07/2022 0801       This score is the patient's risk of an unplanned readmission within 30 days of being discharged (0 -100%). The score is based on dignosis, age, lab data, medications, orders, and past utilization.   Low:  0-14.9   Medium: 15-21.9   High: 22-29.9   Extreme: 30 and above         Discharge Instructions:  Allergies as of 10/07/2022       Reactions   Iodine Nausea And Vomiting   With CT scans, tolerated with Zofran and 12.5 mg benadryl (no other premeds) 8/3. Likely adverse reaction rather than allergy   Iodinated Contrast Media Nausea And Vomiting   Nausea and vomiting even at slow injection rate Other Reaction(s): GI Intolerance Other Reaction(s): vomiting/nauseasted        Medication List     TAKE these medications    Anusol-HC 25 MG suppository Generic drug: hydrocortisone Place 25 mg rectally daily as needed for hemorrhoids.   aspirin 81 MG chewable tablet Chew 81 mg by mouth at bedtime.   CALCIUM + D3 PO Take 1 tablet by mouth daily.   hydrochlorothiazide 12.5 MG tablet Commonly known as: HYDRODIURIL Take 12.5 mg by mouth every morning.   Invokana 300 MG Tabs tablet Generic drug: canagliflozin TAKE ONE TABLET BY MOUTH BEFORE BREAKFAST  latanoprost 0.005 % ophthalmic solution Commonly known as: XALATAN Place 1 drop into both eyes at bedtime.   lisinopril 10 MG tablet Commonly known as: ZESTRIL Take 1 tablet by mouth daily.   metFORMIN 500 MG 24 hr tablet Commonly known as: GLUCOPHAGE-XR Take 1 tablet (500 mg total) by mouth daily with supper.   OneTouch Verio test strip Generic drug: glucose  blood USE TO CHECK BLOOD GLUCOSE ONCE DAILY   Polyethyl Glycol-Propyl Glycol 0.4-0.3 % Soln Place 1 drop into both eyes 2 (two) times daily as needed (dry/irritated eyes.).   ProAir RespiClick 108 (90 Base) MCG/ACT Aepb Generic drug: Albuterol Sulfate INHALE TWO PUFFS into lungs EVERY 4 HOURS AS NEEDED FOR WHEEZING, FOR SHORTNESS OF BREATH AND FOR tightness   rosuvastatin 10 MG tablet Commonly known as: CRESTOR Take 10 mg by mouth at bedtime.   Rybelsus 7 MG Tabs Generic drug: Semaglutide Take 7 mg by mouth daily with breakfast.         Follow-up Information     Reather Littler, MD Follow up in 1 week(s).   Specialty: Endocrinology Contact information: [address] [place] [address] [phone]                 Allergies  Allergen Reactions   Iodine Nausea And Vomiting    With CT scans, tolerated with Zofran and 12.5 mg benadryl (no other premeds) 8/3. Likely adverse reaction rather than allergy   Iodinated Contrast Media Nausea And Vomiting    Nausea and vomiting even at slow injection rate  Other Reaction(s): GI Intolerance  Other Reaction(s): vomiting/nauseasted     The results of significant diagnostics from this hospitalization (including imaging, microbiology, ancillary and laboratory) are listed below for reference.   Consultations:   Procedures/Studies: CT Angio Chest/Abd/Pel for Dissection W and/or Wo Contrast  Result Date: 10/05/2022 CLINICAL DATA:  Aortic aneurysm. Acute aortic syndrome. Epigastric abdominal pain. EXAM: CT ANGIOGRAPHY CHEST, ABDOMEN AND PELVIS TECHNIQUE: Non-contrast CT of the chest was initially obtained. Multidetector CT imaging through the chest, abdomen and pelvis was performed using the standard protocol during bolus administration of intravenous contrast. Multiplanar reconstructed images and MIPs were obtained and reviewed to evaluate the vascular anatomy. RADIATION DOSE REDUCTION: This exam was performed  according to the departmental dose-optimization program which includes automated exposure control, adjustment of the mA and/or kV according to patient size and/or use of iterative reconstruction technique. CONTRAST:  OMNIPAQUE IOHEXOL 350 MG/ML SOLN COMPARISON:  11/04/2013 FINDINGS: CTA CHEST FINDINGS Cardiovascular: Moderate coronary artery calcification global cardiac size is within normal limits. No pericardial effusion. Central pulmonary arteries are enlarged in keeping with changes of pulmonary arterial hypertension. Mild atherosclerotic calcification within the thoracic aorta. No intramural hematoma, dissection, or aneurysm. Arch vasculature demonstrates normal anatomic configuration and is widely patent proximally. Mediastinum/Nodes: No enlarged mediastinal, hilar, or axillary lymph nodes. Thyroid gland, trachea, and esophagus demonstrate no significant findings. Lungs/Pleura: Stable 8 mm subpleural pulmonary nodule within the right lower lobe, axial image # 104/6, safely considered benign. There is complete collapse of the right middle lobe. No central obstructing lesion identified. Mild centrilobular emphysema. No pneumothorax or pleural effusion. Musculoskeletal: No chest wall abnormality. No acute or significant osseous findings. Review of the MIP images confirms the above findings. CTA ABDOMEN AND PELVIS FINDINGS VASCULAR Aorta: Normal caliber aorta without aneurysm, dissection, vasculitis or significant stenosis. Moderate atherosclerotic calcification. Celiac: Patent without evidence of aneurysm, dissection, vasculitis or significant stenosis. SMA: Patent without evidence of aneurysm, dissection,  vasculitis or significant stenosis. Renals: Both renal arteries are patent without evidence of aneurysm, dissection, vasculitis, fibromuscular dysplasia or significant stenosis. Tiny accessory renal arteries are noted bilaterally. IMA: Less than 50% stenosis of the origin.  Distally widely patent. Inflow:  Patent without evidence of aneurysm, dissection, vasculitis or significant stenosis. Moderate atherosclerotic calcification. Veins: No obvious venous abnormality within the limitations of this arterial phase study. Review of the MIP images confirms the above findings. NON-VASCULAR Hepatobiliary: No focal liver abnormality is seen. No gallstones, gallbladder wall thickening, or biliary dilatation. Pancreas: There is inflammatory stranding surrounding the uncinate process and head of the pancreas which may reflect changes of acute interstitial/edematous pancreatitis. Normal enhancement on the pancreatic parenchyma on this limited examination. There is effacement of the intervening fat between the pancreas and third portion of the duodenum which also appears thickened and demonstrates periduodenal inflammatory stranding. While this may relate to acute pancreatitis, a primary duodenitis could appear similarly. No loculated peripancreatic fluid collections are identified. The pancreatic duct is not dilated. Spleen: Unremarkable Adrenals/Urinary Tract: Adrenal glands are unremarkable. Kidneys are normal, without renal calculi, focal lesion, or hydronephrosis. Bladder is unremarkable. Stomach/Bowel: As noted above, there is thickening and surrounding inflammatory stranding involving the third portion of the duodenum which may relate to adjacent acute pancreatitis or a primary duodenitis. There is no evidence of obstruction. Inflammatory changes extend into the retroperitoneum anterior to the aortoiliac vasculature. The stomach, small bowel, and large bowel are otherwise unremarkable. Appendix normal. No free intraperitoneal gas or fluid. Lymphatic: No pathologic adenopathy within the abdomen and pelvis. Reproductive: Uterus and bilateral adnexa are unremarkable. Other: No abdominal wall hernia. Musculoskeletal: 6.6 cm intramuscular lipoma within the right iliopsoas muscle distally. No acute bone abnormality. No lytic or  blastic bone lesion. Osseous structures are age-appropriate. Review of the MIP images confirms the above findings. IMPRESSION: 1. No evidence of aortic aneurysm or dissection. 2. Moderate coronary artery calcification. 3. Enlargement of the central pulmonary arteries in keeping with changes of pulmonary arterial hypertension. 4. Complete collapse of the right middle lobe. No central obstructing lesion identified. 5. Mild centrilobular emphysema. 6. Stable 8 mm subpleural pulmonary nodule within the right lower lobe, safely considered benign. 7. Inflammatory stranding surrounding the uncinate process and head of the pancreas which may reflect changes of acute interstitial/edematous pancreatitis. There is effacement of the intervening fat between the pancreas and third portion of the duodenum which also appears thickened and demonstrates periduodenal inflammatory stranding. While this may relate to adjacent acute pancreatitis, a primary duodenitis could appear similarly. Correlation with serum amylase and lipase levels would be helpful for further evaluation. Aortic Atherosclerosis (ICD10-I70.0) and Emphysema (ICD10-J43.9). Electronically Signed   By: Helyn Numbers M.D.   On: 10/05/2022 01:45   DG Chest 2 View  Result Date: 10/04/2022 CLINICAL DATA:  Chest pain EXAM: CHEST - 2 VIEW COMPARISON:  CT 09/30/2022 FINDINGS: Right middle lobe collapse noted similar to prior CT examination. Lungs are otherwise clear. No pneumothorax or pleural effusion. The aorta is aneurysmal, better assessed on prior CT examination. Cardiac size within normal limits. No acute bone abnormality. IMPRESSION: 1. Chronic right middle lobe collapse. No active cardiopulmonary disease. 2. Aortic aneurysm, better assessed on prior CT examination. Electronically Signed   By: Helyn Numbers M.D.   On: 10/04/2022 23:09   CT ANGIO CHEST AORTA W/CM & OR WO/CM  Result Date: 09/30/2022 CLINICAL DATA:  Ascending aortic aneurysm EXAM: CT ANGIOGRAPHY  CHEST WITH CONTRAST TECHNIQUE: Multidetector CT imaging of the  chest was performed using the standard protocol during bolus administration of intravenous contrast. Multiplanar CT image reconstructions and MIPs were obtained to evaluate the vascular anatomy. RADIATION DOSE REDUCTION: This exam was performed according to the departmental dose-optimization program which includes automated exposure control, adjustment of the mA and/or kV according to patient size and/or use of iterative reconstruction technique. CONTRAST:  75mL ISOVUE-370 IOPAMIDOL (ISOVUE-370) INJECTION 76% COMPARISON:  Multiple priors, most recent chest CT dated September 25, 2021 FINDINGS: Cardiovascular: Normal heart size. No pericardial effusion. Mildly dilated ascending thoracic aorta, measuring up to 4.0 cm, unchanged when compared with the prior exam. Moderate coronary artery calcifications. Mediastinum/Nodes: Esophagus and thyroid are unremarkable. No enlarged lymph nodes seen in the chest. Lungs/Pleura: Central airways are patent. Mild centrilobular emphysema. Unchanged right middle lobe atelectasis, no evidence of central obstructing mass. Solid right middle lobe pulmonary nodules measuring 7 mm on series 11, image 94, 9 x 6 mm on image 112 and 4 mm on image 115. No pleural effusion. Upper Abdomen: Enhancing lesion of the left lobe of the liver measuring 1.7 cm on series 4, image 83. no acute abnormality. Musculoskeletal: No chest wall abnormality. No acute or significant osseous findings. Review of the MIP images confirms the above findings. IMPRESSION: 1. Stable mildly dilated ascending thoracic aorta, measuring up to 4.0 cm. Recommend annual imaging followup by CTA or MRA. This recommendation follows 2010 ACCF/AHA/AATS/ACR/ASA/SCA/SCAI/SIR/STS/SVM Guidelines for the Diagnosis and Management of Patients with Thoracic Aortic Disease. Circulation. 2010; 121: Z610-R604. Aortic aneurysm NOS (ICD10-I71.9) 2. Chronic right middle lobe atelectasis. 3.  Stable solid right lower lobe pulmonary nodules. Recommend attention on follow-up. 4. Indeterminate enhancing lesion of the left lobe of the liver measuring 1.7 cm. Recommend further evaluation with contrast-enhanced abdominal MRI. 5. Coronary artery calcifications, aortic Atherosclerosis (ICD10-I70.0) and Emphysema (ICD10-J43.9). Electronically Signed   By: Allegra  M.D.   On: 09/30/2022 14:05      Labs: BNP (last 3 results) No results for input(s): "BNP" in the last 8760 hours. Basic Metabolic Panel: Recent Labs  Lab 10/04/22 2243 10/05/22 0604 10/06/22 0433 10/07/22 0426  NA 135  --  138 138  K 4.4  --  3.8 4.2  CL 99  --  102 101  CO2 25  --  26 25  GLUCOSE 148*  --  101* 81  BUN 14  --  9 12  CREATININE 0.93 0.86 0.65 0.75  CALCIUM 9.2  --  8.8* 8.8*  MG  --   --  2.1 2.3   Liver Function Tests: Recent Labs  Lab 10/04/22 2243  AST 21  ALT 13  ALKPHOS 45  BILITOT 0.9  PROT 8.5*  ALBUMIN 4.3   Recent Labs  Lab 10/05/22 0022  LIPASE 66*   No results for input(s): "AMMONIA" in the last 168 hours. CBC: Recent Labs  Lab 10/04/22 2243 10/05/22 0604 10/06/22 0433 10/07/22 0426  WBC 5.8 5.3 4.8 4.2  NEUTROABS 4.0  --   --   --   HGB 15.8* 14.5 13.7 13.7  HCT 52.4* 46.1* 43.6 44.5  MCV 95.1 91.3 91.8 93.5  PLT 183 192 191 202   Cardiac Enzymes: No results for input(s): "CKTOTAL", "CKMB", "CKMBINDEX", "TROPONINI" in the last 168 hours. BNP: Invalid input(s): "POCBNP" CBG: Recent Labs  Lab 10/05/22 2020 10/06/22 0749 10/06/22 1126 10/06/22 1559 10/06/22 2200  GLUCAP 161* 95 113* 139* 152*   D-Dimer No results for input(s): "DDIMER" in the last 72 hours. Hgb A1c Recent Labs  10/05/22 0604  HGBA1C 7.3*   Lipid Profile Recent Labs    10/06/22 0433  TRIG 54   Thyroid function studies No results for input(s): "TSH", "T4TOTAL", "T3FREE", "THYROIDAB" in the last 72 hours.  Invalid input(s): "FREET3" Anemia work up No results for  input(s): "VITAMINB12", "FOLATE", "FERRITIN", "TIBC", "IRON", "RETICCTPCT" in the last 72 hours. Urinalysis    Component Value Date/Time   COLORURINE YELLOW (A) 10/05/2022 0022   APPEARANCEUR CLEAR (A) 10/05/2022 0022   LABSPEC 1.041 (H) 10/05/2022 0022   PHURINE 6.0 10/05/2022 0022   GLUCOSEU >=500 (A) 10/05/2022 0022   GLUCOSEU NEGATIVE 05/04/2018 1518   HGBUR NEGATIVE 10/05/2022 0022   BILIRUBINUR NEGATIVE 10/05/2022 0022   KETONESUR NEGATIVE 10/05/2022 0022   PROTEINUR NEGATIVE 10/05/2022 0022   UROBILINOGEN 0.2 05/04/2018 1518   NITRITE NEGATIVE 10/05/2022 0022   LEUKOCYTESUR NEGATIVE 10/05/2022 0022   Sepsis Labs Recent Labs  Lab 10/04/22 2243 10/05/22 0604 10/06/22 0433 10/07/22 0426  WBC 5.8 5.3 4.8 4.2   Microbiology No results found for this or any previous visit (from the past 240 hour(s)).   Total time spend on discharging this patient, including the last patient exam, discussing the hospital stay, instructions for ongoing care as it relates to all pertinent caregivers, as well as preparing the medical discharge records, prescriptions, and/or referrals as applicable, is 30 minutes.    Darlin Priestly, MD  [facility] 10/07/2022, 8:03 AM

## 2022-10-07 NOTE — Progress Notes (Signed)
Went through the discharge paperwork with pt, verbalize understanding of follow up appointment and meds. She left with her family member

## 2022-10-08 ENCOUNTER — Ambulatory Visit: Payer: PPO | Admitting: Endocrinology

## 2022-10-09 ENCOUNTER — Other Ambulatory Visit: Payer: Self-pay

## 2022-10-09 ENCOUNTER — Encounter (HOSPITAL_COMMUNITY): Payer: Self-pay

## 2022-10-09 ENCOUNTER — Emergency Department (HOSPITAL_COMMUNITY)
Admission: EM | Admit: 2022-10-09 | Discharge: 2022-10-09 | Disposition: A | Discharge status: Home / Self Care | Payer: PPO | Attending: Emergency Medicine | Admitting: Emergency Medicine

## 2022-10-09 DIAGNOSIS — E119 Type 2 diabetes mellitus without complications: Secondary | ICD-10-CM | POA: Diagnosis not present

## 2022-10-09 DIAGNOSIS — Z7984 Long term (current) use of oral hypoglycemic drugs: Secondary | ICD-10-CM | POA: Insufficient documentation

## 2022-10-09 DIAGNOSIS — Z7982 Long term (current) use of aspirin: Secondary | ICD-10-CM | POA: Insufficient documentation

## 2022-10-09 DIAGNOSIS — R1033 Periumbilical pain: Secondary | ICD-10-CM | POA: Diagnosis present

## 2022-10-09 DIAGNOSIS — J449 Chronic obstructive pulmonary disease, unspecified: Secondary | ICD-10-CM | POA: Diagnosis not present

## 2022-10-09 DIAGNOSIS — R9431 Abnormal electrocardiogram [ECG] [EKG]: Secondary | ICD-10-CM | POA: Insufficient documentation

## 2022-10-09 DIAGNOSIS — I4510 Unspecified right bundle-branch block: Secondary | ICD-10-CM | POA: Diagnosis not present

## 2022-10-09 DIAGNOSIS — R600 Localized edema: Secondary | ICD-10-CM | POA: Insufficient documentation

## 2022-10-09 DIAGNOSIS — K852 Alcohol induced acute pancreatitis without necrosis or infection: Secondary | ICD-10-CM | POA: Diagnosis not present

## 2022-10-09 DIAGNOSIS — R1013 Epigastric pain: Secondary | ICD-10-CM | POA: Diagnosis not present

## 2022-10-09 DIAGNOSIS — I10 Essential (primary) hypertension: Secondary | ICD-10-CM | POA: Diagnosis not present

## 2022-10-09 DIAGNOSIS — K853 Drug induced acute pancreatitis without necrosis or infection: Secondary | ICD-10-CM | POA: Insufficient documentation

## 2022-10-09 DIAGNOSIS — R1084 Generalized abdominal pain: Secondary | ICD-10-CM | POA: Diagnosis not present

## 2022-10-09 LAB — CBC WITH DIFFERENTIAL/PLATELET
Abs Immature Granulocytes: 0.03 10*3/uL (ref 0.00–0.07)
Basophils Absolute: 0 10*3/uL (ref 0.0–0.1)
Basophils Relative: 1 %
Eosinophils Absolute: 0.1 10*3/uL (ref 0.0–0.5)
Eosinophils Relative: 1 %
HCT: 48.1 % — ABNORMAL HIGH (ref 36.0–46.0)
Hemoglobin: 14.7 g/dL (ref 12.0–15.0)
Immature Granulocytes: 1 %
Lymphocytes Relative: 30 %
Lymphs Abs: 1.6 10*3/uL (ref 0.7–4.0)
MCH: 28.3 pg (ref 26.0–34.0)
MCHC: 30.6 g/dL (ref 30.0–36.0)
MCV: 92.7 fL (ref 80.0–100.0)
Monocytes Absolute: 0.3 10*3/uL (ref 0.1–1.0)
Monocytes Relative: 6 %
Neutro Abs: 3.2 10*3/uL (ref 1.7–7.7)
Neutrophils Relative %: 61 %
Platelets: 235 10*3/uL (ref 150–400)
RBC: 5.19 MIL/uL — ABNORMAL HIGH (ref 3.87–5.11)
RDW: 13 % (ref 11.5–15.5)
WBC: 5.2 10*3/uL (ref 4.0–10.5)
nRBC: 0 % (ref 0.0–0.2)

## 2022-10-09 LAB — URINALYSIS, ROUTINE W REFLEX MICROSCOPIC
Bacteria, UA: NONE SEEN
Bilirubin Urine: NEGATIVE
Glucose, UA: >=500 mg/dL — AB
Hgb urine dipstick: NEGATIVE
Ketones, ur: NEGATIVE mg/dL
Leukocytes,Ua: NEGATIVE
Nitrite: NEGATIVE
Protein, ur: NEGATIVE mg/dL
Specific Gravity, Urine: 1.016 (ref 1.005–1.030)
pH: 6 (ref 5.0–8.0)

## 2022-10-09 LAB — COMPREHENSIVE METABOLIC PANEL
ALT: 14 U/L (ref 0–44)
AST: 26 U/L (ref 15–41)
Albumin: 3.7 g/dL (ref 3.5–5.0)
Alkaline Phosphatase: 34 U/L — ABNORMAL LOW (ref 38–126)
Anion gap: 14 (ref 5–15)
BUN: 12 mg/dL (ref 8–23)
CO2: 24 mmol/L (ref 22–32)
Calcium: 9.2 mg/dL (ref 8.9–10.3)
Chloride: 97 mmol/L — ABNORMAL LOW (ref 98–111)
Creatinine, Ser: 0.84 mg/dL (ref 0.44–1.00)
GFR, Estimated: >60 mL/min (ref >60)
Glucose, Bld: 91 mg/dL (ref 70–99)
Potassium: 3.8 mmol/L (ref 3.5–5.1)
Sodium: 135 mmol/L (ref 135–145)
Total Bilirubin: 0.8 mg/dL (ref 0.3–1.2)
Total Protein: 7.5 g/dL (ref 6.5–8.1)

## 2022-10-09 LAB — TROPONIN I (HIGH SENSITIVITY): Troponin I (High Sensitivity): 9 ng/L (ref <18)

## 2022-10-09 LAB — LIPASE, BLOOD: Lipase: 148 U/L — ABNORMAL HIGH (ref 11–51)

## 2022-10-09 MED ORDER — OXYCODONE HCL 5 MG PO TABS: 2.5000 mg | ORAL_TABLET | ORAL | 0 refills | Status: DC | PRN

## 2022-10-09 MED ORDER — ONDANSETRON 4 MG PO TBDP: 4.0000 mg | ORAL_TABLET | Freq: Three times a day (TID) | ORAL | 0 refills | Status: DC | PRN

## 2022-10-09 NOTE — ED Triage Notes (Signed)
Pt BIBEMS w/ c/o epigastric pain x2 days w/ some N/V, sent from UC due to EKG changes; Rt bundle block.

## 2022-10-09 NOTE — Discharge Instructions (Signed)
You were seen for pancreatitis in the emergency department.   At home, please take Tylenol for your pain.  Take Zofran for your nausea.  Take oxycodone for any breakthrough pain but do not take it before driving because it can make you drowsy.    Check your MyChart online for the results of any tests that had not resulted by the time you left the emergency department.   Follow-up with your primary doctor in 2-3 days regarding your visit.    Return immediately to the emergency department if you experience any of the following: Severe pain, vomiting despite the medication, or any other concerning symptoms.    Thank you for visiting our Emergency Department. It was a pleasure taking care of you today.

## 2022-10-09 NOTE — ED Provider Notes (Signed)
[place] EMERGENCY DEPARTMENT AT [facility] Provider Note   CSN: [id number] Arrival date & time: 10/09/22  1929     History {Add pertinent medical, surgical, social history, OB history to HPI:1} Chief Complaint  Patient presents with   Abdominal Pain    Angel Tanner is a 82 y.o. female.  82 year old female with a history of diabetes, COPD, ascending thoracic aortic aneurysm, and pancreatitis who presents to the emergency department periumbilical abdominal pain.  Was admitted and discharged on 10/04/2022 for pancreatitis that was thought to be due to her diabetic medications.  Reports that none of her medications were changed and she was not sent home with pain medication.  Went to urgent care today with persistent abdominal pain that was mildly worse today and they obtained an EKG that was abnormal and referred her to the emergency department.  She denies any chest pain, shortness of breath, diaphoresis, vomiting or nausea.  Says that the abdominal pain is currently mild and is periumbilical and is worsened with eating but then also fasting.  Says that it is positional as well.        Home Medications Prior to Admission medications   Medication Sig Start Date End Date Taking? Authorizing Provider  Albuterol Sulfate (PROAIR RESPICLICK) 108 (90 Base) MCG/ACT AEPB INHALE TWO PUFFS into lungs EVERY 4 HOURS AS NEEDED FOR WHEEZING, FOR SHORTNESS OF BREATH AND FOR tightness 09/28/21   Byrum, Les Pou, MD  aspirin 81 MG chewable tablet Chew 81 mg by mouth at bedtime.     [provider]  Calcium Carb-Cholecalciferol (CALCIUM + D3 PO) Take 1 tablet by mouth daily.    [provider]  hydrochlorothiazide (HYDRODIURIL) 12.5 MG tablet Take 12.5 mg by mouth every morning. 11/27/21   [provider]  hydrocortisone (ANUSOL-HC) 25 MG suppository Place 25 mg rectally daily as needed for hemorrhoids. 03/16/20   [provider]  INVOKANA 300 MG TABS tablet  TAKE ONE TABLET BY MOUTH BEFORE BREAKFAST 02/14/22   Reather Littler, MD  latanoprost (XALATAN) 0.005 % ophthalmic solution Place 1 drop into both eyes at bedtime. 03/22/18   [provider]  lisinopril (ZESTRIL) 10 MG tablet Take 1 tablet by mouth daily.    [provider]  metFORMIN (GLUCOPHAGE-XR) 500 MG 24 hr tablet Take 1 tablet (500 mg total) by mouth daily with supper. 02/27/22   Reather Littler, MD  [facility] VERIO test strip USE TO CHECK BLOOD GLUCOSE ONCE DAILY 09/16/22   Reather Littler, MD  Polyethyl Glycol-Propyl Glycol 0.4-0.3 % SOLN Place 1 drop into both eyes 2 (two) times daily as needed (dry/irritated eyes.).    [provider]  rosuvastatin (CRESTOR) 10 MG tablet Take 10 mg by mouth at bedtime.     [provider]  Semaglutide (RYBELSUS) 7 MG TABS Take 7 mg by mouth daily with breakfast.    [provider]      Allergies    Iodine and Iodinated contrast media    Review of Systems   Review of Systems  Physical Exam Updated Vital Signs BP (!) 178/92   Pulse 79   Temp 99 F (37.2 C) (Oral)   Resp 17   Ht 5\' 1"  (1.549 m)   Wt 78.5 kg   SpO2 100%   BMI 32.69 kg/m  Physical Exam Vitals and nursing note reviewed.  Constitutional:      General: She is not in acute distress.    Appearance: She is well-developed.  HENT:  Head: Normocephalic and atraumatic.     Right Ear: External ear normal.     Left Ear: External ear normal.     Nose: Nose normal.  Eyes:     Extraocular Movements: Extraocular movements intact.     Conjunctiva/sclera: Conjunctivae normal.     Pupils: Pupils are equal, round, and reactive to light.  Cardiovascular:     Rate and Rhythm: Normal rate and regular rhythm.     Heart sounds: No murmur heard. Pulmonary:     Effort: Pulmonary effort is normal. No respiratory distress.     Breath sounds: Normal breath sounds.  Abdominal:     General: Abdomen is flat. There is no distension.     Palpations: Abdomen is  soft. There is no mass.     Tenderness: There is abdominal tenderness (Periumbilical, epigastric). There is no guarding.  Musculoskeletal:     Cervical back: Normal range of motion and neck supple.     Right lower leg: Edema present.     Left lower leg: Edema present.  Skin:    General: Skin is warm and dry.  Neurological:     Mental Status: She is alert and oriented to person, place, and time. Mental status is at baseline.  Psychiatric:        Mood and Affect: Mood normal.     ED Results / Procedures / Treatments   Labs (all labs ordered are listed, but only abnormal results are displayed) Labs Reviewed  COMPREHENSIVE METABOLIC PANEL  LIPASE, BLOOD  CBC WITH DIFFERENTIAL/PLATELET  URINALYSIS, ROUTINE W REFLEX MICROSCOPIC  TROPONIN I (HIGH SENSITIVITY)    EKG None  Radiology No results found.  Procedures Procedures  {Document cardiac monitor, telemetry assessment procedure when appropriate:1}  Medications Ordered in ED Medications - No data to display  ED Course/ Medical Decision Making/ A&P   {   Click here for ABCD2, HEART and other calculatorsREFRESH Note before signing :1}                              Medical Decision Making Amount and/or Complexity of Data Reviewed Labs: ordered.   ***  {Document critical care time when appropriate:1} {Document review of labs and clinical decision tools ie heart score, Chads2Vasc2 etc:1}  {Document your independent review of radiology images, and any outside records:1} {Document your discussion with family members, caretakers, and with consultants:1} {Document social determinants of health affecting pt's care:1} {Document your decision making why or why not admission, treatments were needed:1} Final Clinical Impression(s) / ED Diagnoses Final diagnoses:  None    Rx / DC Orders ED Discharge Orders     None

## 2022-10-10 ENCOUNTER — Telehealth (HOSPITAL_COMMUNITY): Payer: Self-pay | Admitting: Emergency Medicine

## 2022-10-10 DIAGNOSIS — K853 Drug induced acute pancreatitis without necrosis or infection: Secondary | ICD-10-CM | POA: Diagnosis not present

## 2022-10-10 DIAGNOSIS — R9431 Abnormal electrocardiogram [ECG] [EKG]: Secondary | ICD-10-CM

## 2022-10-10 NOTE — Telephone Encounter (Signed)
Called the patient to let her know that I was placing a cardiology referral for her abnormal EKG.  She states that she is not having any chest pain, shortness of breath or dizziness or other concerning symptoms since leaving the department yesterday.

## 2022-10-16 ENCOUNTER — Ambulatory Visit: Payer: PPO | Admitting: Endocrinology

## 2022-10-16 ENCOUNTER — Encounter: Payer: Self-pay | Admitting: Endocrinology

## 2022-10-16 DIAGNOSIS — E119 Type 2 diabetes mellitus without complications: Secondary | ICD-10-CM

## 2022-10-16 DIAGNOSIS — Z7984 Long term (current) use of oral hypoglycemic drugs: Secondary | ICD-10-CM | POA: Diagnosis not present

## 2022-10-16 MED ORDER — METFORMIN HCL ER 500 MG PO TB24
1000.0000 mg | ORAL_TABLET | Freq: Every day | ORAL | 3 refills | Status: DC
Start: 2022-10-16 — End: 2023-01-03
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
## 2022-10-16 NOTE — Patient Instructions (Signed)
Diabetes medications: Stop rybelsus due to pancreatitis. Continue invokana 300 mg daily. Increase metfromin ER 500 mg from 1 tab to 2 tabs daily with supper.

## 2022-10-17 DIAGNOSIS — R413 Other amnesia: Secondary | ICD-10-CM | POA: Diagnosis not present

## 2022-10-17 DIAGNOSIS — K859 Acute pancreatitis without necrosis or infection, unspecified: Secondary | ICD-10-CM | POA: Diagnosis not present

## 2022-10-17 DIAGNOSIS — R932 Abnormal findings on diagnostic imaging of liver and biliary tract: Secondary | ICD-10-CM | POA: Diagnosis not present

## 2022-10-18 ENCOUNTER — Other Ambulatory Visit: Payer: Self-pay | Admitting: Internal Medicine

## 2022-10-18 DIAGNOSIS — R932 Abnormal findings on diagnostic imaging of liver and biliary tract: Secondary | ICD-10-CM

## 2022-10-21 ENCOUNTER — Emergency Department: Payer: PPO

## 2022-10-21 ENCOUNTER — Other Ambulatory Visit: Payer: Self-pay

## 2022-10-21 ENCOUNTER — Encounter: Payer: Self-pay | Admitting: *Deleted

## 2022-10-21 ENCOUNTER — Emergency Department
Admission: EM | Admit: 2022-10-21 | Discharge: 2022-10-22 | Disposition: A | Discharge status: Home / Self Care | Payer: PPO | Attending: Emergency Medicine | Admitting: Emergency Medicine

## 2022-10-21 DIAGNOSIS — K824 Cholesterolosis of gallbladder: Secondary | ICD-10-CM | POA: Diagnosis not present

## 2022-10-21 DIAGNOSIS — R1013 Epigastric pain: Secondary | ICD-10-CM | POA: Diagnosis present

## 2022-10-21 DIAGNOSIS — K851 Biliary acute pancreatitis without necrosis or infection: Secondary | ICD-10-CM | POA: Diagnosis not present

## 2022-10-21 DIAGNOSIS — E119 Type 2 diabetes mellitus without complications: Secondary | ICD-10-CM | POA: Diagnosis not present

## 2022-10-21 DIAGNOSIS — I10 Essential (primary) hypertension: Secondary | ICD-10-CM | POA: Diagnosis not present

## 2022-10-21 DIAGNOSIS — K859 Acute pancreatitis without necrosis or infection, unspecified: Secondary | ICD-10-CM | POA: Diagnosis not present

## 2022-10-21 LAB — URINALYSIS, ROUTINE W REFLEX MICROSCOPIC
Bilirubin Urine: NEGATIVE
Glucose, UA: >=500 mg/dL — AB
Hgb urine dipstick: NEGATIVE
Ketones, ur: 5 mg/dL — AB
Leukocytes,Ua: NEGATIVE
Nitrite: NEGATIVE
Protein, ur: NEGATIVE mg/dL
Specific Gravity, Urine: 1.028 (ref 1.005–1.030)
pH: 5 (ref 5.0–8.0)

## 2022-10-21 LAB — COMPREHENSIVE METABOLIC PANEL
ALT: 16 U/L (ref 0–44)
AST: 17 U/L (ref 15–41)
Albumin: 4.4 g/dL (ref 3.5–5.0)
Alkaline Phosphatase: 37 U/L — ABNORMAL LOW (ref 38–126)
Anion gap: 13 (ref 5–15)
BUN: 17 mg/dL (ref 8–23)
CO2: 27 mmol/L (ref 22–32)
Calcium: 9.6 mg/dL (ref 8.9–10.3)
Chloride: 97 mmol/L — ABNORMAL LOW (ref 98–111)
Creatinine, Ser: 0.93 mg/dL (ref 0.44–1.00)
GFR, Estimated: >60 mL/min (ref >60)
Glucose, Bld: 109 mg/dL — ABNORMAL HIGH (ref 70–99)
Potassium: 3.8 mmol/L (ref 3.5–5.1)
Sodium: 137 mmol/L (ref 135–145)
Total Bilirubin: 0.7 mg/dL (ref 0.3–1.2)
Total Protein: 8 g/dL (ref 6.5–8.1)

## 2022-10-21 LAB — TROPONIN I (HIGH SENSITIVITY): Troponin I (High Sensitivity): 6 ng/L (ref <18)

## 2022-10-21 LAB — CBC
HCT: 49.3 % — ABNORMAL HIGH (ref 36.0–46.0)
Hemoglobin: 15 g/dL (ref 12.0–15.0)
MCH: 28.8 pg (ref 26.0–34.0)
MCHC: 30.4 g/dL (ref 30.0–36.0)
MCV: 94.6 fL (ref 80.0–100.0)
Platelets: 245 10*3/uL (ref 150–400)
RBC: 5.21 MIL/uL — ABNORMAL HIGH (ref 3.87–5.11)
RDW: 13.2 % (ref 11.5–15.5)
WBC: 4.6 10*3/uL (ref 4.0–10.5)
nRBC: 0 % (ref 0.0–0.2)

## 2022-10-21 LAB — LIPASE, BLOOD: Lipase: 102 U/L — ABNORMAL HIGH (ref 11–51)

## 2022-10-21 MED ORDER — ONDANSETRON HCL 4 MG/2ML IJ SOLN
4.0000 mg | Freq: Once | INTRAMUSCULAR | Status: AC
  Administered 2022-10-21: 4 mg via INTRAVENOUS
  Filled 2022-10-21: qty 2

## 2022-10-21 MED ORDER — MORPHINE SULFATE (PF) 4 MG/ML IV SOLN
4.0000 mg | Freq: Once | INTRAVENOUS | Status: AC
  Administered 2022-10-21: 4 mg via INTRAVENOUS
  Filled 2022-10-21: qty 1

## 2022-10-21 MED ORDER — LACTATED RINGERS IV BOLUS
1000.0000 mL | Freq: Once | INTRAVENOUS | Status: AC
  Administered 2022-10-21: 1000 mL via INTRAVENOUS

## 2022-10-21 NOTE — ED Triage Notes (Signed)
Pt has abd pain with nausea.  Pt has nausea.  Pt denies chest pain or sob.  Sx began yesterday   hx pancreatitis   pt alert

## 2022-10-21 NOTE — ED Provider Notes (Signed)
[facility] Provider Note    Event Date/Time   First MD Initiated Contact with Patient 10/21/22 2204     (approximate)   History   Abdominal Pain   HPI Angel Tanner is a 82 y.o. female with recurrent pancreatitis, DM2, HTN who presents today for epigastric abdominal pain.  Patient has had 2 prior emergency department visits for the same symptoms in the last 2 weeks when she was diagnosed with pancreatitis originally on 10/05/2022.  She required admission the first time and the second time was able to be discharged from the ED after symptom resolution.  She notes over the past 12 days since the last visit she has good days and bad days with the pain symptoms.  Pain got worse again tonight and she came in for further evaluation.  Otherwise, denying fevers, chest pain, shortness of breath, cough, nausea, vomiting, diarrhea, constipation.  Pain symptoms are the same as her pancreatitis symptoms.  Reviewing prior notes, visit on 10/05/2022 with admission had CTA abdomen and pelvis which showed inflammatory changes around the pancreas concerning for pancreatitis.  At this point, it was assumed concern for diabetic medication related however patient has been on these for a long time.  They had plan for outpatient MRCP.     Physical Exam   Triage Vital Signs: ED Triage Vitals  Encounter Vitals Group     BP 10/21/22 2047 (!) 148/90     Systolic BP Percentile --      Diastolic BP Percentile --      Pulse Rate 10/21/22 2047 93     Resp 10/21/22 2047 18     Temp 10/21/22 2047 98.5 F (36.9 C)     Temp Source 10/21/22 2047 Oral     SpO2 10/21/22 2047 95 %     Weight 10/21/22 2044 170 lb (77.1 kg)     Height 10/21/22 2044 5\' 1"  (1.549 m)     Head Circumference --      Peak Flow --      Pain Score 10/21/22 2044 8     Pain Loc --      Pain Education --      Exclude from Growth Chart --     Most recent vital signs: Vitals:   10/21/22 2047  BP: (!) 148/90   Pulse: 93  Resp: 18  Temp: 98.5 F (36.9 C)  SpO2: 95%    Physical Exam: I have reviewed the vital signs and nursing notes. General: Awake, alert, no acute distress.  Nontoxic appearing. Head:  Atraumatic, normocephalic.   ENT:  EOM intact, PERRL. Oral mucosa is pink and moist with no lesions. Neck: Neck is supple with full range of motion, No meningeal signs. Cardiovascular:  RRR, No murmurs. Peripheral pulses palpable and equal bilaterally. Respiratory:  Symmetrical chest wall expansion.  No rhonchi, rales, or wheezes.  Good air movement throughout.  No use of accessory muscles.   Musculoskeletal:  No cyanosis or edema. Moving extremities with full ROM Abdomen:  Soft, tenderness to palpation in epigastric region, nondistended. Neuro:  GCS 15, moving all four extremities, interacting appropriately. Speech clear. Psych:  Calm, appropriate.   Skin:  Warm, dry, no rash.    ED Results / Procedures / Treatments   Labs (all labs ordered are listed, but only abnormal results are displayed) Labs Reviewed  LIPASE, BLOOD - Abnormal; Notable for the following components:      Result Value   Lipase 102 (*)  All other components within normal limits  COMPREHENSIVE METABOLIC PANEL - Abnormal; Notable for the following components:   Chloride 97 (*)    Glucose, Bld 109 (*)    Alkaline Phosphatase 37 (*)    All other components within normal limits  CBC - Abnormal; Notable for the following components:   RBC 5.21 (*)    HCT 49.3 (*)    All other components within normal limits  URINALYSIS, ROUTINE W REFLEX MICROSCOPIC - Abnormal; Notable for the following components:   Color, Urine YELLOW (*)    APPearance CLEAR (*)    Glucose, UA >=500 (*)    Ketones, ur 5 (*)    Bacteria, UA RARE (*)    All other components within normal limits  TROPONIN I (HIGH SENSITIVITY)  TROPONIN I (HIGH SENSITIVITY)     EKG    RADIOLOGY    PROCEDURES:  Critical Care performed:  No  Procedures   MEDICATIONS ORDERED IN ED: Medications  lactated ringers bolus 1,000 mL (1,000 mLs Intravenous New Bag/Given 10/21/22 2246)  morphine (PF) 4 MG/ML injection 4 mg (4 mg Intravenous Given 10/21/22 2248)  ondansetron (ZOFRAN) injection 4 mg (4 mg Intravenous Given 10/21/22 2247)     IMPRESSION / MDM / ASSESSMENT AND PLAN / ED COURSE  I reviewed the triage vital signs and the nursing notes.                              Differential diagnosis includes, but is not limited to, pancreatitis, gastritis, cholecystitis, viral gastroenteritis.  Patient's presentation is most consistent with acute presentation with potential threat to life or bodily function.  Patient is an 82 year old female presenting today for recurrent pancreatitis symptoms.  Symptoms are the same as her previous 2 admissions in the past 15 days.  Prior CT imaging showed evidence of pancreatitis but no further changes.  Pain symptoms of the same today and do not suspect she needs additional imaging at this time.  Lipase mildly elevated at 102 but is decreased from recent labs.  CMP and CBC otherwise reassuring.  Troponin of 6 with no concern for ACS at this time.  Patient was given morphine, Zofran, and 1 L of fluids.  Family also concerned about possible gallstones as a source of her pancreatitis.  Right upper quadrant ultrasound was ordered.  Patient was signed out to Dr. Modesto Charon while awaiting reassessment after medication and results of right upper quadrant ultrasound.  The patient is on the cardiac monitor to evaluate for evidence of arrhythmia and/or significant heart rate changes.     FINAL CLINICAL IMPRESSION(S) / ED DIAGNOSES   Final diagnoses:  Acute pancreatitis, unspecified complication status, unspecified pancreatitis type     Rx / DC Orders   ED Discharge Orders     None        Note:  This document was prepared using Dragon voice recognition software and may include unintentional dictation  errors.   Janith Lima, MD 10/21/22 [phone]

## 2022-10-22 LAB — TROPONIN I (HIGH SENSITIVITY): Troponin I (High Sensitivity): 7 ng/L (ref <18)

## 2022-10-22 MED ORDER — OXYCODONE HCL 5 MG PO TABS
5.0000 mg | ORAL_TABLET | Freq: Three times a day (TID) | ORAL | 0 refills | Status: DC | PRN
Start: 2022-10-22 — End: 2023-02-18

## 2022-10-22 MED ORDER — ACETAMINOPHEN 325 MG PO TABS
650.0000 mg | ORAL_TABLET | Freq: Once | ORAL | Status: AC
  Administered 2022-10-22: 650 mg via ORAL
  Filled 2022-10-22: qty 2

## 2022-10-22 MED ORDER — IBUPROFEN 400 MG PO TABS
400.0000 mg | ORAL_TABLET | Freq: Once | ORAL | Status: AC
  Administered 2022-10-22: 400 mg via ORAL
  Filled 2022-10-22: qty 1

## 2022-10-22 MED ORDER — ONDANSETRON HCL 4 MG PO TABS: 4.0000 mg | ORAL_TABLET | Freq: Every day | ORAL | 1 refills | Status: DC | PRN

## 2022-10-22 NOTE — Discharge Instructions (Addendum)
Thank you for choosing [place] for your health care today!  Take acetaminophen 650 mg and ibuprofen 400 mg every 6 hours for pain.  Take with food. Take oxycodone as prescribed for severe pain only.  Take zofran for nausea as prescribed.  Please see your primary doctor this week for a follow up appointment.   If you have any new, worsening, or unexpected symptoms call your doctor right away or come back to the emergency department for reevaluation.  It was my pleasure to care for you today.

## 2022-10-22 NOTE — ED Provider Notes (Signed)
Right upper quadrant ultrasound negative.  Patient feels much better after pain medications, plan is for discharge close PMD/GI follow-up.   Pilar Jarvis, MD 10/22/22 [phone]

## 2022-10-23 ENCOUNTER — Telehealth: Payer: Self-pay

## 2022-10-23 DIAGNOSIS — K859 Acute pancreatitis without necrosis or infection, unspecified: Secondary | ICD-10-CM | POA: Diagnosis not present

## 2022-10-23 DIAGNOSIS — R932 Abnormal findings on diagnostic imaging of liver and biliary tract: Secondary | ICD-10-CM | POA: Diagnosis not present

## 2022-10-23 NOTE — Telephone Encounter (Signed)
Pt called stated that she follow up with her PCP today because she was having a lot stomach pain, and nausea . Her PCP  advised her to stop taking the Invokana. Because this is reason why she is have  issues with her stomach. Pt wants to what other medication that she can take in place of the Invokana . Please advise

## 2022-10-24 NOTE — Telephone Encounter (Signed)
Called and spoke w/ pt to inform of this message , pt is going to hold medication and call us if she has any high reading. And advised patient to call the office patient stated that she understood this.

## 2022-11-20 ENCOUNTER — Encounter: Payer: Self-pay | Admitting: Physician Assistant

## 2022-11-20 DIAGNOSIS — K859 Acute pancreatitis without necrosis or infection, unspecified: Secondary | ICD-10-CM | POA: Diagnosis not present

## 2022-11-20 DIAGNOSIS — R413 Other amnesia: Secondary | ICD-10-CM | POA: Diagnosis not present

## 2022-11-20 DIAGNOSIS — Z23 Encounter for immunization: Secondary | ICD-10-CM | POA: Diagnosis not present

## 2022-11-20 DIAGNOSIS — R932 Abnormal findings on diagnostic imaging of liver and biliary tract: Secondary | ICD-10-CM | POA: Diagnosis not present

## 2022-11-29 ENCOUNTER — Inpatient Hospital Stay: Admission: RE | Admit: 2022-11-29 | Payer: PPO | Source: Ambulatory Visit

## 2022-12-07 NOTE — Progress Notes (Signed)
Assessment/Plan:     Angel Tanner is a very pleasant 82 y.o. year old RH female with a history of hypertension, hyperlipidemia, DM2, asthma, CKD3,  COPD asthma, glaucoma, OSA unable to tolerate CPAP, Ascending Aortic Aneurysm, GERD, seen today for evaluation of memory loss. MoCA today is 20/30. Etiology is unclear. Workup is in progress. Patient is able to participate on his ADLs and to drive without difficulties    Memory Impairment  MRI brain without contrast to assess for underlying structural abnormality and assess vascular load  Neurocognitive testing to further evaluate cognitive concerns and determine other underlying cause of memory changes, including potential contribution from sleep, anxiety, attention, or depression among others  Check  TSH and B12 Recommend good control of cardiovascular risk factors.   Continue to control mood as per PCP Folllow up in 1 month  Subjective:    The patient is here alone  How long did patient have memory difficulties?  For about 1 -2 months.  Patient reports some difficulty remembering new information, recent conversations, names. She forgets appointments.Writes sticky notes and hints to remember . Likes to do crossword puzzles and online games.  Likes going to [employer].   repeats oneself?   Denies  Disoriented when walking into a room?  Patient denies.    Leaving objects in unusual places?  denies   Wandering behavior? denies .  Any personality changes, or depression, anxiety? Denies  Hallucinations or paranoia? Denies.   Seizures? Denies.    Any sleep changes?  Sleeps well, but I don't sleep more than 6 hours, denies vivid dreams, REM behavior or sleepwalking   Sleep apnea? Denies.   Any hygiene concerns?  Denies.   Independent of bathing and dressing? Endorsed  Does the patient need help with medications? Patient is in charge   Who is in charge of the finances? Patient is in charge     Any changes in appetite?   Denies.      Patient have trouble swallowing?  Denies.   Does the patient cook? Yes. denies any kitchen accidents  Any headaches?  Denies.   Chronic pain? Denies.   Ambulates with difficulty? Denies. Does not participate on physical activity classes  Recent falls or head injuries? Denies.     Vision changes?  Denies any new issues. She is a  " glaucoma watch patient " Any strokelike symptoms? Denies.   Any tremors? Denies.   Any anosmia? Denies.   Any incontinence of urine? Denies. Uses past sometimes "just in case" Any bowel dysfunction? Denies  Patient lives with her sister   History of heavy alcohol intake? Denies.   History of heavy tobacco use? Denies.   Family history of dementia?  Denies  Drives? Yes, denies any issues  Retired from Restaurant manager, fast food.  Allergies  Allergen Reactions   Iodine Nausea And Vomiting    With CT scans, tolerated with Zofran and 12.5 mg benadryl (no other premeds) 8/3. Likely adverse reaction rather than allergy   Iodinated Contrast Media Nausea And Vomiting    Nausea and vomiting even at slow injection rate  Other Reaction(s): GI Intolerance  Other Reaction(s): vomiting/nauseasted    Current Outpatient Medications  Medication Instructions   Albuterol Sulfate (PROAIR RESPICLICK) 108 (90 Base) MCG/ACT AEPB INHALE TWO PUFFS into lungs EVERY 4 HOURS AS NEEDED FOR WHEEZING, FOR SHORTNESS OF BREATH AND FOR tightness   aspirin 81 mg, Oral, Daily at bedtime   Calcium Carb-Cholecalciferol (CALCIUM + D3 PO) 1 tablet,  Oral, Daily   hydrochlorothiazide (HYDRODIURIL) 12.5 mg, Oral, Every morning   hydrocortisone (ANUSOL-HC) 25 mg, Daily PRN   INVOKANA 300 MG TABS tablet TAKE ONE TABLET BY MOUTH BEFORE BREAKFAST   latanoprost (XALATAN) 0.005 % ophthalmic solution 1 drop, Both Eyes, Daily at bedtime   lisinopril (ZESTRIL) 10 MG tablet 1 tablet, Oral, Daily   metFORMIN (GLUCOPHAGE-XR) 1,000 mg, Oral, Daily with supper   ondansetron (ZOFRAN) 4 mg, Oral, Daily PRN    ONETOUCH VERIO test strip USE TO CHECK BLOOD GLUCOSE ONCE DAILY   oxyCODONE (ROXICODONE) 5 mg, Oral, Every 8 hours PRN   Polyethyl Glycol-Propyl Glycol 0.4-0.3 % SOLN 1 drop, Both Eyes, 2 times daily PRN   rosuvastatin (CRESTOR) 10 mg, Oral, Daily at bedtime     VITALS:   Vitals:   12/12/22 0916 12/12/22 1046  BP: (!) 143/83 137/84  Pulse: 98   Resp: 20   SpO2: 100%   Weight: 175 lb (79.4 kg)   Height: 5\' 1"  (1.549 m)       PHYSICAL EXAM   HEENT:  Normocephalic, atraumatic.  The superficial temporal arteries are without ropiness or tenderness. Cardiovascular: Regular rate and rhythm. Lungs: Clear to auscultation bilaterally. Neck: There are no carotid bruits noted bilaterally.  NEUROLOGICAL:    12/12/2022    9:00 AM  Montreal Cognitive Assessment   Visuospatial/ Executive (0/5) 4  Naming (0/3) 3  Attention: Read list of digits (0/2) 2  Attention: Read list of letters (0/1) 1  Attention: Serial 7 subtraction starting at 100 (0/3) 1  Language: Repeat phrase (0/2) 2  Language : Fluency (0/1) 1  Abstraction (0/2) 1  Delayed Recall (0/5) 0  Orientation (0/6) 5  Total 20  Adjusted Score (based on education) 20        No data to display           Orientation:  Alert and oriented to person, place and not to time . No aphasia or dysarthria. Fund of knowledge is appropriate. Recent and remote memory impaired.  Attention and concentration are reduced .  Able to name objects and repeat phrases . Delayed recall  0/5 Cranial nerves: There is good facial symmetry. Extraocular muscles are intact and visual fields are full to confrontational testing. Speech is fluent and clear. No tongue deviation. Hearing is intact to conversational tone. Tone: Tone is good throughout. Sensation: Sensation is intact to light touch.  Vibration is intact at the bilateral big toe.  Coordination: The patient has no difficulty with RAM's or FNF bilaterally. Normal finger to nose  Motor: Strength  is 5/5 in the bilateral upper and lower extremities. There is no pronator drift. There are no fasciculations noted. DTR's: Deep tendon reflexes are 2/4 bilaterally. Gait and Station: The patient is able to ambulate without difficulty. Gait is cautious and narrow. Stride length is normal        Thank you for allowing [place] the opportunity to participate in the care of this nice patient. Please do not hesitate to contact us for any questions or concerns.   Total time spent on today's visit was 30 minutes dedicated to this patient today, preparing to see patient, examining the patient, ordering tests and/or medications and counseling the patient, documenting clinical information in the EHR or other health record, independently interpreting results and communicating results to the patient/family, discussing treatment and goals, answering patient's questions and coordinating care.  Cc:  Renford Dills, MD  [name] 12/12/2022 12:12 PM

## 2022-12-11 DIAGNOSIS — R0600 Dyspnea, unspecified: Secondary | ICD-10-CM | POA: Diagnosis not present

## 2022-12-11 DIAGNOSIS — R600 Localized edema: Secondary | ICD-10-CM | POA: Diagnosis not present

## 2022-12-12 ENCOUNTER — Encounter: Payer: Self-pay | Admitting: Physician Assistant

## 2022-12-12 ENCOUNTER — Other Ambulatory Visit (HOSPITAL_COMMUNITY): Payer: Self-pay | Admitting: Internal Medicine

## 2022-12-12 ENCOUNTER — Other Ambulatory Visit: Payer: PPO

## 2022-12-12 ENCOUNTER — Ambulatory Visit (HOSPITAL_COMMUNITY)
Admission: RE | Admit: 2022-12-12 | Discharge: 2022-12-12 | Disposition: A | Discharge status: Home / Self Care | Payer: PPO | Source: Ambulatory Visit | Attending: Vascular Surgery | Admitting: Vascular Surgery

## 2022-12-12 ENCOUNTER — Ambulatory Visit (INDEPENDENT_AMBULATORY_CARE_PROVIDER_SITE_OTHER): Payer: PPO | Admitting: Physician Assistant

## 2022-12-12 VITALS — BP 137/84 | HR 98 | Resp 20 | Ht 61.0 in | Wt 175.0 lb

## 2022-12-12 DIAGNOSIS — R600 Localized edema: Secondary | ICD-10-CM

## 2022-12-12 DIAGNOSIS — R413 Other amnesia: Secondary | ICD-10-CM

## 2022-12-12 LAB — TSH: TSH: 1.81 u[IU]/mL (ref 0.35–5.50)

## 2022-12-12 LAB — VITAMIN B12: Vitamin B-12: 218 pg/mL (ref 211–911)

## 2022-12-12 NOTE — Progress Notes (Signed)
vitamin B12 is low.  Recommend starting on vitamin B12 1000 mcg daily.  Follow-up with PCP. The thyroid levels are normal  Thank you

## 2022-12-12 NOTE — Patient Instructions (Addendum)
It was a pleasure to see you today at our office.   Recommendations:  Neurocognitive evaluation at our office   MRI of the brain, the radiology office will call you to arrange you appointment  [phone] Check labs today  [address] Follow up in Nov 13 10 am     For psychiatric meds, mood meds: Please have your primary care physician manage these medications.  If you have any severe symptoms of a stroke, or other severe issues such as confusion,severe chills or fever, etc call 911 or go to the ER as you may need to be evaluated further   For guidance regarding [facility] and if placement were needed at the facility, contact Social Worker tel: [phone]  For assessment of decision of mental capacity and competency:  Call Dr. Erick Blinks, geriatric psychiatrist at [phone]  Counseling regarding caregiver distress, including caregiver depression, anxiety and issues regarding community resources, adult day care programs, adult living facilities, or memory care questions:  please contact your  Primary Doctor's Social Worker   Whom to call: Memory  decline, memory medications: Call our office [phone]    [email or web address]   RECOMMENDATIONS FOR ALL PATIENTS WITH MEMORY PROBLEMS: 1. Continue to exercise (Recommend 30 minutes of walking everyday, or 3 hours every week) 2. Increase social interactions - continue going to [place] and enjoy social gatherings with friends and family 3. Eat healthy, avoid fried foods and eat more fruits and vegetables 4. Maintain adequate blood pressure, blood sugar, and blood cholesterol level. Reducing the risk of stroke and cardiovascular disease also helps promoting better memory. 5. Avoid stressful situations. Live a simple life and avoid aggravations. Organize your time and prepare for the next day in anticipation. 6. Sleep well, avoid any interruptions of sleep and  avoid any distractions in the bedroom that may interfere with adequate sleep quality 7. Avoid sugar, avoid sweets as there is a strong link between excessive sugar intake, diabetes, and cognitive impairment We discussed the Mediterranean diet, which has been shown to help patients reduce the risk of progressive memory disorders and reduces cardiovascular risk. This includes eating fish, eat fruits and green leafy vegetables, nuts like almonds and hazelnuts, walnuts, and also use olive oil. Avoid fast foods and fried foods as much as possible. Avoid sweets and sugar as sugar use has been linked to worsening of memory function.  There is always a concern of gradual progression of memory problems. If this is the case, then we may need to adjust level of care according to patient needs. Support, both to the patient and caregiver, should then be put into place.      You have been referred for a neuropsychological evaluation (i.e., evaluation of memory and thinking abilities). Please bring someone with you to this appointment if possible, as it is helpful for the doctor to hear from both you and another adult who knows you well. Please bring eyeglasses and hearing aids if you wear them.    The evaluation will take approximately 3 hours and has two parts:   The first part is a clinical interview with the neuropsychologist (Dr. Milbert Coulter or Dr. Roseanne Reno). During the interview, the neuropsychologist will speak with you and the individual you brought to the appointment.    The second part of the evaluation is testing with the doctor's technician Annabelle Harman or Selena Batten). During the testing, the technician will ask you to remember different types of material, solve problems, and answer some questionnaires. Your family member  will not be present for this portion of the evaluation.   Please note: We must reserve several hours of the neuropsychologist's time and the psychometrician's time for your evaluation appointment. As such,  there is a No-Show fee of $100. If you are unable to attend any of your appointments, please contact our office as soon as possible to reschedule.      DRIVING: Regarding driving, in patients with progressive memory problems, driving will be impaired. We advise to have someone else do the driving if trouble finding directions or if minor accidents are reported. Independent driving assessment is available to determine safety of driving.   If you are interested in the driving assessment, you can contact the following:  [facility] in [place] [phone]  [facility] [phone]  [facility] [phone]  [facility] [place] [phone] or [phone]   FALL PRECAUTIONS: Be cautious when walking. Scan the area for obstacles that may increase the risk of trips and falls. When getting up in the mornings, sit up at the edge of the bed for a few minutes before getting out of bed. Consider elevating the bed at the head end to avoid drop of blood pressure when getting up. Walk always in a well-lit room (use night lights in the walls). Avoid area rugs or power cords from appliances in the middle of the walkways. Use a walker or a cane if necessary and consider physical therapy for balance exercise. Get your eyesight checked regularly.  FINANCIAL OVERSIGHT: Supervision, especially oversight when making financial decisions or transactions is also recommended.  HOME SAFETY: Consider the safety of the kitchen when operating appliances like stoves, microwave oven, and blender. Consider having supervision and share cooking responsibilities until no longer able to participate in those. Accidents with firearms and other hazards in the house should be identified and addressed as well.   ABILITY TO BE LEFT ALONE: If patient is unable to contact 911 operator, consider using LifeLine, or when the need is there, arrange for someone to stay with patients. Smoking is a  fire hazard, consider supervision or cessation. Risk of wandering should be assessed by caregiver and if detected at any point, supervision and safe proof recommendations should be instituted.  MEDICATION SUPERVISION: Inability to self-administer medication needs to be constantly addressed. Implement a mechanism to ensure safe administration of the medications.      Mediterranean Diet A Mediterranean diet refers to food and lifestyle choices that are based on the traditions of countries located on the [place]. This way of eating has been shown to help prevent certain conditions and improve outcomes for people who have chronic diseases, like kidney disease and heart disease. What are tips for following this plan? Lifestyle  Cook and eat meals together with your family, when possible. Drink enough fluid to keep your urine clear or pale yellow. Be physically active every day. This includes: Aerobic exercise like running or swimming. Leisure activities like gardening, walking, or housework. Get 7-8 hours of sleep each night. If recommended by your health care provider, drink red wine in moderation. This means 1 glass a day for nonpregnant women and 2 glasses a day for men. A glass of wine equals 5 oz (150 mL). Reading food labels  Check the serving size of packaged foods. For foods such as rice and pasta, the serving size refers to the amount of cooked product, not dry. Check the total fat in packaged foods. Avoid foods that have saturated fat or trans fats.  Check the ingredients list for added sugars, such as corn syrup. Shopping  At the grocery store, buy most of your food from the areas near the walls of the store. This includes: Fresh fruits and vegetables (produce). Grains, beans, nuts, and seeds. Some of these may be available in unpackaged forms or large amounts (in bulk). Fresh seafood. Poultry and eggs. Low-fat dairy products. Buy whole ingredients instead of prepackaged  foods. Buy fresh fruits and vegetables in-season from local farmers markets. Buy frozen fruits and vegetables in resealable bags. If you do not have access to quality fresh seafood, buy precooked frozen shrimp or canned fish, such as tuna, salmon, or sardines. Buy small amounts of raw or cooked vegetables, salads, or olives from the deli or salad bar at your store. Stock your pantry so you always have certain foods on hand, such as olive oil, canned tuna, canned tomatoes, rice, pasta, and beans. Cooking  Cook foods with extra-virgin olive oil instead of using butter or other vegetable oils. Have meat as a side dish, and have vegetables or grains as your main dish. This means having meat in small portions or adding small amounts of meat to foods like pasta or stew. Use beans or vegetables instead of meat in common dishes like chili or lasagna. Experiment with different cooking methods. Try roasting or broiling vegetables instead of steaming or sauteing them. Add frozen vegetables to soups, stews, pasta, or rice. Add nuts or seeds for added healthy fat at each meal. You can add these to yogurt, salads, or vegetable dishes. Marinate fish or vegetables using olive oil, lemon juice, garlic, and fresh herbs. Meal planning  Plan to eat 1 vegetarian meal one day each week. Try to work up to 2 vegetarian meals, if possible. Eat seafood 2 or more times a week. Have healthy snacks readily available, such as: Vegetable sticks with hummus. Greek yogurt. Fruit and nut trail mix. Eat balanced meals throughout the week. This includes: Fruit: 2-3 servings a day Vegetables: 4-5 servings a day Low-fat dairy: 2 servings a day Fish, poultry, or lean meat: 1 serving a day Beans and legumes: 2 or more servings a week Nuts and seeds: 1-2 servings a day Whole grains: 6-8 servings a day Extra-virgin olive oil: 3-4 servings a day Limit red meat and sweets to only a few servings a month What are my food  choices? Mediterranean diet Recommended Grains: Whole-grain pasta. Brown rice. Bulgar wheat. Polenta. Couscous. Whole-wheat bread. Orpah Cobb. Vegetables: Artichokes. Beets. Broccoli. Cabbage. Carrots. Eggplant. Green beans. Chard. Kale. Spinach. Onions. Leeks. Peas. Squash. Tomatoes. Peppers. Radishes. Fruits: Apples. Apricots. Avocado. Berries. Bananas. Cherries. Dates. Figs. Grapes. Lemons. Melon. Oranges. Peaches. Plums. Pomegranate. Meats and other protein foods: Beans. Almonds. Sunflower seeds. Pine nuts. Peanuts. Cod. Salmon. Scallops. Shrimp. Tuna. Tilapia. Clams. Oysters. Eggs. Dairy: Low-fat milk. Cheese. Greek yogurt. Beverages: Water. Red wine. Herbal tea. Fats and oils: Extra virgin olive oil. Avocado oil. Grape seed oil. Sweets and desserts: [place] yogurt with honey. Baked apples. Poached pears. Trail mix. Seasoning and other foods: Basil. Cilantro. Coriander. Cumin. Mint. Parsley. Sage. Rosemary. Tarragon. Garlic. Oregano. Thyme. Pepper. Balsalmic vinegar. Tahini. Hummus. Tomato sauce. Olives. Mushrooms. Limit these Grains: Prepackaged pasta or rice dishes. Prepackaged cereal with added sugar. Vegetables: Deep fried potatoes (french fries). Fruits: Fruit canned in syrup. Meats and other protein foods: Beef. Pork. Lamb. Poultry with skin. Hot dogs. Tomasa Blase. Dairy: Ice cream. Sour cream. Whole milk. Beverages: Juice. Sugar-sweetened soft drinks. Beer. Liquor and spirits. Fats and oils:  Butter. Canola oil. Vegetable oil. Beef fat (tallow). Lard. Sweets and desserts: Cookies. Cakes. Pies. Candy. Seasoning and other foods: Mayonnaise. Premade sauces and marinades. The items listed may not be a complete list. Talk with your dietitian about what dietary choices are right for you. Summary The Mediterranean diet includes both food and lifestyle choices. Eat a variety of fresh fruits and vegetables, beans, nuts, seeds, and whole grains. Limit the amount of red meat and sweets that  you eat. Talk with your health care provider about whether it is safe for you to drink red wine in moderation. This means 1 glass a day for nonpregnant women and 2 glasses a day for men. A glass of wine equals 5 oz (150 mL). This information is not intended to replace advice given to you by your health care provider. Make sure you discuss any questions you have with your health care provider. Document Released: 10/12/2015 Document Revised: 11/14/2015 Document Reviewed: 10/12/2015 Elsevier Interactive Patient Education  2017 [employer].

## 2022-12-13 ENCOUNTER — Ambulatory Visit
Admission: RE | Admit: 2022-12-13 | Discharge: 2022-12-13 | Disposition: A | Discharge status: Home / Self Care | Payer: PPO | Source: Ambulatory Visit | Attending: Internal Medicine | Admitting: Internal Medicine

## 2022-12-13 DIAGNOSIS — R932 Abnormal findings on diagnostic imaging of liver and biliary tract: Secondary | ICD-10-CM

## 2022-12-13 DIAGNOSIS — K769 Liver disease, unspecified: Secondary | ICD-10-CM | POA: Diagnosis not present

## 2022-12-13 MED ORDER — GADOPICLENOL 0.5 MMOL/ML IV SOLN
10.0000 mL | Freq: Once | INTRAVENOUS | Status: AC | PRN
  Administered 2022-12-13: 8 mL via INTRAVENOUS

## 2022-12-26 ENCOUNTER — Ambulatory Visit: Payer: PPO | Admitting: Endocrinology

## 2022-12-26 ENCOUNTER — Encounter: Payer: Self-pay | Admitting: Endocrinology

## 2022-12-26 VITALS — BP 134/80 | HR 85 | Resp 20 | Ht 61.0 in | Wt 175.6 lb

## 2022-12-26 DIAGNOSIS — Z7984 Long term (current) use of oral hypoglycemic drugs: Secondary | ICD-10-CM

## 2022-12-26 DIAGNOSIS — E119 Type 2 diabetes mellitus without complications: Secondary | ICD-10-CM

## 2022-12-26 LAB — MICROALBUMIN / CREATININE URINE RATIO
Creatinine,U: 92.1 mg/dL
Microalb Creat Ratio: 7.3 mg/g (ref 0.0–30.0)
Microalb, Ur: 6.8 mg/dL — ABNORMAL HIGH (ref 0.0–1.9)

## 2022-12-26 NOTE — Patient Instructions (Signed)
Continue invokana 300 mg daily. Metfromin ER 500 mg 2 tabs daily with supper.
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
## 2022-12-27 ENCOUNTER — Encounter: Payer: Self-pay | Admitting: Cardiovascular Disease

## 2022-12-27 ENCOUNTER — Ambulatory Visit: Payer: PPO | Attending: Cardiovascular Disease | Admitting: Cardiovascular Disease

## 2022-12-27 VITALS — BP 136/82 | HR 78 | Ht 61.5 in | Wt 175.4 lb

## 2022-12-27 DIAGNOSIS — I77810 Thoracic aortic ectasia: Secondary | ICD-10-CM

## 2022-12-27 DIAGNOSIS — I700 Atherosclerosis of aorta: Secondary | ICD-10-CM

## 2022-12-27 DIAGNOSIS — J449 Chronic obstructive pulmonary disease, unspecified: Secondary | ICD-10-CM

## 2022-12-27 DIAGNOSIS — F17200 Nicotine dependence, unspecified, uncomplicated: Secondary | ICD-10-CM

## 2022-12-27 DIAGNOSIS — E7800 Pure hypercholesterolemia, unspecified: Secondary | ICD-10-CM

## 2022-12-27 DIAGNOSIS — E1159 Type 2 diabetes mellitus with other circulatory complications: Secondary | ICD-10-CM | POA: Diagnosis not present

## 2022-12-27 DIAGNOSIS — I10 Essential (primary) hypertension: Secondary | ICD-10-CM | POA: Diagnosis not present

## 2022-12-27 DIAGNOSIS — I209 Angina pectoris, unspecified: Secondary | ICD-10-CM | POA: Diagnosis not present

## 2022-12-27 DIAGNOSIS — G4733 Obstructive sleep apnea (adult) (pediatric): Secondary | ICD-10-CM

## 2022-12-27 DIAGNOSIS — R072 Precordial pain: Secondary | ICD-10-CM
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
## 2022-12-27 NOTE — Patient Instructions (Addendum)
[facility] Address: [address], [place], [place] [address] Open ? Closes 2:30?PM Phone: [phone]   Medication Instructions:  No changes  If you need a refill on your cardiac medications before your next appointment, please call your pharmacy.   Lab work: No new labs needed  Testing/Procedures: Your provider has ordered a Lexiscan/ Exercise Myoview Stress test. This will take place at [facility]. Please report to the [facility] medical mall entrance. The volunteers at the first desk will direct you where to go.  [facility] MYOVIEW  Your provider has ordered a Stress Test with nuclear imaging. The purpose of this test is to evaluate the blood supply to your heart muscle. This procedure is referred to as a "Non-Invasive Stress Test." This is because other than having an IV started in your vein, nothing is inserted or "invades" your body. Cardiac stress tests are done to find areas of poor blood flow to the heart by determining the extent of coronary artery disease (CAD). Some patients exercise on a treadmill, which naturally increases the blood flow to your heart, while others who are unable to walk on a treadmill due to physical limitations will have a pharmacologic/chemical stress agent called Lexiscan . This medicine will mimic walking on a treadmill by temporarily increasing your coronary blood flow.   Please note: these test may take anywhere between 2-4 hours to complete  How to prepare for your Myoview test:  Nothing to eat for 6 hours prior to the test No caffeine for 24 hours prior to test No smoking 24 hours prior to test. Your medication may be taken with water.  Hold hydrochlorothiazide the morning of your test.  Merla Riches, please do not wear dresses.  Skirts or pants are appropriate. Please wear a short sleeve shirt. No perfume, cologne or lotion. Wear comfortable walking shoes. No heels!   PLEASE NOTIFY THE OFFICE AT LEAST 24 HOURS IN ADVANCE IF  YOU ARE UNABLE TO KEEP YOUR APPOINTMENT.  [phone] AND  PLEASE NOTIFY NUCLEAR MEDICINE AT [facility] AT LEAST 24 HOURS IN ADVANCE IF YOU ARE UNABLE TO KEEP YOUR APPOINTMENT. [phone]   Follow-Up: At [facility], you and your health needs are our priority.  As part of our continuing mission to provide you with exceptional heart care, we have created designated Provider Care Teams.  These Care Teams include your primary Cardiologist (physician) and Advanced Practice Providers (APPs -  Physician Assistants and Nurse Practitioners) who all work together to provide you with the care you need, when you need it.  You will need a follow up appointment in 12 months  Providers on your designated Care Team:   Nicolasa Ducking, NP Eula Listen, PA-C Cadence Fransico [place], [place]  COVID-19 Vaccine Information can be found at: [email or web address] For questions related to vaccine distribution or appointments, please email [email or web address] or call [phone].

## 2023-01-03 ENCOUNTER — Other Ambulatory Visit: Payer: Self-pay

## 2023-01-03 ENCOUNTER — Telehealth: Payer: Self-pay

## 2023-01-03 DIAGNOSIS — E119 Type 2 diabetes mellitus without complications: Secondary | ICD-10-CM

## 2023-01-03 MED ORDER — METFORMIN HCL ER 500 MG PO TB24: 1000.0000 mg | ORAL_TABLET | Freq: Every day | ORAL | 3 refills | Status: DC

## 2023-01-03 NOTE — Telephone Encounter (Signed)
Refill complete 

## 2023-01-07 ENCOUNTER — Encounter: Admission: RE | Admit: 2023-01-07 | Payer: PPO | Source: Ambulatory Visit

## 2023-01-07 ENCOUNTER — Other Ambulatory Visit: Payer: Self-pay

## 2023-01-07 ENCOUNTER — Other Ambulatory Visit: Payer: PPO

## 2023-01-07 DIAGNOSIS — E119 Type 2 diabetes mellitus without complications: Secondary | ICD-10-CM

## 2023-01-12 ENCOUNTER — Ambulatory Visit: Payer: PPO | Admitting: Physician Assistant

## 2023-01-14 ENCOUNTER — Encounter
Admission: RE | Admit: 2023-01-14 | Discharge: 2023-01-14 | Disposition: A | Discharge status: Home / Self Care | Payer: PPO | Source: Ambulatory Visit | Attending: Cardiovascular Disease | Admitting: Cardiovascular Disease

## 2023-01-14 ENCOUNTER — Other Ambulatory Visit: Payer: PPO

## 2023-01-14 ENCOUNTER — Other Ambulatory Visit (HOSPITAL_COMMUNITY): Payer: PPO

## 2023-01-14 DIAGNOSIS — I209 Angina pectoris, unspecified: Secondary | ICD-10-CM | POA: Diagnosis not present

## 2023-01-14 DIAGNOSIS — R072 Precordial pain: Secondary | ICD-10-CM | POA: Insufficient documentation

## 2023-01-14 MED ORDER — TECHNETIUM TC 99M TETROFOSMIN IV KIT
10.0000 | PACK | Freq: Once | INTRAVENOUS | Status: AC
  Administered 2023-01-14: 9.84 via INTRAVENOUS

## 2023-01-14 MED ORDER — TECHNETIUM TC 99M TETROFOSMIN IV KIT
30.0000 | PACK | Freq: Once | INTRAVENOUS | Status: AC
  Administered 2023-01-14: 29.55 via INTRAVENOUS

## 2023-01-14 MED ORDER — REGADENOSON 0.4 MG/5ML IV SOLN
0.4000 mg | Freq: Once | INTRAVENOUS | Status: AC
  Administered 2023-01-14: 0.4 mg via INTRAVENOUS
  Filled 2023-01-14: qty 5

## 2023-01-15 ENCOUNTER — Ambulatory Visit: Payer: PPO | Admitting: Physician Assistant

## 2023-01-15 LAB — NM MYOCAR MULTI W/SPECT W/WALL MOTION / EF
LV dias vol: 58 mL (ref 46–106)
LV sys vol: 15 mL
Nuc Stress EF: 56 %
Peak HR: 100 {beats}/min
Percent HR: 72 %
Rest HR: 78 {beats}/min
Rest Nuclear Isotope Dose: 9.8 mCi
SDS: 1
SRS: 6
SSS: 5
ST Depression (mm): 0 mm
Stress Nuclear Isotope Dose: 29.6 mCi
TID: 0.72

## 2023-01-16 ENCOUNTER — Ambulatory Visit: Payer: PPO | Admitting: Endocrinology

## 2023-01-18 ENCOUNTER — Ambulatory Visit
Admission: RE | Admit: 2023-01-18 | Discharge: 2023-01-18 | Disposition: A | Discharge status: Home / Self Care | Payer: PPO | Source: Ambulatory Visit | Attending: Physician Assistant | Admitting: Physician Assistant

## 2023-01-18 DIAGNOSIS — D320 Benign neoplasm of cerebral meninges: Secondary | ICD-10-CM | POA: Diagnosis not present

## 2023-01-18 DIAGNOSIS — R410 Disorientation, unspecified: Secondary | ICD-10-CM | POA: Diagnosis not present

## 2023-01-22 ENCOUNTER — Ambulatory Visit: Payer: PPO | Admitting: Endocrinology

## 2023-01-24 DIAGNOSIS — M79673 Pain in unspecified foot: Secondary | ICD-10-CM | POA: Diagnosis not present

## 2023-01-27 ENCOUNTER — Telehealth: Payer: Self-pay | Admitting: Emergency Medicine

## 2023-01-27 DIAGNOSIS — R918 Other nonspecific abnormal finding of lung field: Secondary | ICD-10-CM

## 2023-01-27 NOTE — Telephone Encounter (Signed)
Called patient no answer and unable to leave message at this time.   Stress test  Low risk, no significant ischemia  Normal ejection fraction  Coronary calcification noted   Can we place referral to pulmonary for evaluation of right middle lobe lung collapse previously seen on scan August 2024, seen again   Order placed for pulmonary referral.

## 2023-01-29 ENCOUNTER — Encounter: Payer: Self-pay | Admitting: Emergency Medicine

## 2023-02-04 ENCOUNTER — Encounter: Payer: Self-pay | Admitting: Physician Assistant

## 2023-02-04 ENCOUNTER — Ambulatory Visit (INDEPENDENT_AMBULATORY_CARE_PROVIDER_SITE_OTHER): Payer: PPO | Admitting: Physician Assistant

## 2023-02-04 VITALS — BP 140/90 | Resp 20 | Ht 61.5 in

## 2023-02-04 DIAGNOSIS — D329 Benign neoplasm of meninges, unspecified: Secondary | ICD-10-CM

## 2023-02-04 DIAGNOSIS — R413 Other amnesia: Secondary | ICD-10-CM | POA: Diagnosis not present

## 2023-02-04 HISTORY — DX: Benign neoplasm of meninges, unspecified: D32.9

## 2023-02-04 MED ORDER — DONEPEZIL HCL 5 MG PO TABS: 5.0000 mg | ORAL_TABLET | Freq: Every day | ORAL | 11 refills | Status: DC

## 2023-02-04 NOTE — Patient Instructions (Signed)
It was a pleasure to see you today at our office.   Recommendations:  Neurocognitive evaluation at our office   Start Donepezil 5mg  daily  MRI of the brain to look art the meningioma, the radiology office will call you to arrange you appointment  [phone] Continue B12 supplements  Follow up in 3 months      For psychiatric meds, mood meds: Please have your primary care physician manage these medications.  If you have any severe symptoms of a stroke, or other severe issues such as confusion,severe chills or fever, etc call 911 or go to the ER as you may need to be evaluated further   For guidance regarding [facility] and if placement were needed at the facility, contact Social Worker tel: [phone]  For assessment of decision of mental capacity and competency:  Call Dr. Erick Blinks, geriatric psychiatrist at [phone]  Counseling regarding caregiver distress, including caregiver depression, anxiety and issues regarding community resources, adult day care programs, adult living facilities, or memory care questions:  please contact your  Primary Doctor's Social Worker   Whom to call: Memory  decline, memory medications: Call our office [phone]    [email or web address]   RECOMMENDATIONS FOR ALL PATIENTS WITH MEMORY PROBLEMS: 1. Continue to exercise (Recommend 30 minutes of walking everyday, or 3 hours every week) 2. Increase social interactions - continue going to [place] and enjoy social gatherings with friends and family 3. Eat healthy, avoid fried foods and eat more fruits and vegetables 4. Maintain adequate blood pressure, blood sugar, and blood cholesterol level. Reducing the risk of stroke and cardiovascular disease also helps promoting better memory. 5. Avoid stressful situations. Live a simple life and avoid aggravations. Organize your time and prepare for the next day in anticipation. 6.  Sleep well, avoid any interruptions of sleep and avoid any distractions in the bedroom that may interfere with adequate sleep quality 7. Avoid sugar, avoid sweets as there is a strong link between excessive sugar intake, diabetes, and cognitive impairment We discussed the Mediterranean diet, which has been shown to help patients reduce the risk of progressive memory disorders and reduces cardiovascular risk. This includes eating fish, eat fruits and green leafy vegetables, nuts like almonds and hazelnuts, walnuts, and also use olive oil. Avoid fast foods and fried foods as much as possible. Avoid sweets and sugar as sugar use has been linked to worsening of memory function.  There is always a concern of gradual progression of memory problems. If this is the case, then we may need to adjust level of care according to patient needs. Support, both to the patient and caregiver, should then be put into place.      You have been referred for a neuropsychological evaluation (i.e., evaluation of memory and thinking abilities). Please bring someone with you to this appointment if possible, as it is helpful for the doctor to hear from both you and another adult who knows you well. Please bring eyeglasses and hearing aids if you wear them.    The evaluation will take approximately 3 hours and has two parts:   The first part is a clinical interview with the neuropsychologist (Dr. Milbert Coulter or Dr. Roseanne Reno). During the interview, the neuropsychologist will speak with you and the individual you brought to the appointment.    The second part of the evaluation is testing with the doctor's technician Annabelle Harman or Selena Batten). During the testing, the technician will ask you to remember different types of material, solve problems,  and answer some questionnaires. Your family member will not be present for this portion of the evaluation.   Please note: We must reserve several hours of the neuropsychologist's time and the  psychometrician's time for your evaluation appointment. As such, there is a No-Show fee of $100. If you are unable to attend any of your appointments, please contact our office as soon as possible to reschedule.      DRIVING: Regarding driving, in patients with progressive memory problems, driving will be impaired. We advise to have someone else do the driving if trouble finding directions or if minor accidents are reported. Independent driving assessment is available to determine safety of driving.   If you are interested in the driving assessment, you can contact the following:  [facility] [employer] [facility] in [place] [phone]  [facility] [phone]  [facility] [phone]  [facility] [phone] or [phone]   FALL PRECAUTIONS: Be cautious when walking. Scan the area for obstacles that may increase the risk of trips and falls. When getting up in the mornings, sit up at the edge of the bed for a few minutes before getting out of bed. Consider elevating the bed at the head end to avoid drop of blood pressure when getting up. Walk always in a well-lit room (use night lights in the walls). Avoid area rugs or power cords from appliances in the middle of the walkways. Use a walker or a cane if necessary and consider physical therapy for balance exercise. Get your eyesight checked regularly.  FINANCIAL OVERSIGHT: Supervision, especially oversight when making financial decisions or transactions is also recommended.  HOME SAFETY: Consider the safety of the kitchen when operating appliances like stoves, microwave oven, and blender. Consider having supervision and share cooking responsibilities until no longer able to participate in those. Accidents with firearms and other hazards in the house should be identified and addressed as well.   ABILITY TO BE LEFT ALONE: If patient is unable to contact 911 operator, consider using LifeLine, or when the need  is there, arrange for someone to stay with patients. Smoking is a fire hazard, consider supervision or cessation. Risk of wandering should be assessed by caregiver and if detected at any point, supervision and safe proof recommendations should be instituted.  MEDICATION SUPERVISION: Inability to self-administer medication needs to be constantly addressed. Implement a mechanism to ensure safe administration of the medications.      Mediterranean Diet A Mediterranean diet refers to food and lifestyle choices that are based on the traditions of countries located on the [place]. This way of eating has been shown to help prevent certain conditions and improve outcomes for people who have chronic diseases, like kidney disease and heart disease. What are tips for following this plan? Lifestyle  Cook and eat meals together with your family, when possible. Drink enough fluid to keep your urine clear or pale yellow. Be physically active every day. This includes: Aerobic exercise like running or swimming. Leisure activities like gardening, walking, or housework. Get 7-8 hours of sleep each night. If recommended by your health care provider, drink red wine in moderation. This means 1 glass a day for nonpregnant women and 2 glasses a day for men. A glass of wine equals 5 oz (150 mL). Reading food labels  Check the serving size of packaged foods. For foods such as rice and pasta, the serving size refers to the amount of cooked product, not dry. Check the total fat in packaged foods. Avoid foods  that have saturated fat or trans fats. Check the ingredients list for added sugars, such as corn syrup. Shopping  At the grocery store, buy most of your food from the areas near the walls of the store. This includes: Fresh fruits and vegetables (produce). Grains, beans, nuts, and seeds. Some of these may be available in unpackaged forms or large amounts (in bulk). Fresh seafood. Poultry and  eggs. Low-fat dairy products. Buy whole ingredients instead of prepackaged foods. Buy fresh fruits and vegetables in-season from local farmers markets. Buy frozen fruits and vegetables in resealable bags. If you do not have access to quality fresh seafood, buy precooked frozen shrimp or canned fish, such as tuna, salmon, or sardines. Buy small amounts of raw or cooked vegetables, salads, or olives from the deli or salad bar at your store. Stock your pantry so you always have certain foods on hand, such as olive oil, canned tuna, canned tomatoes, rice, pasta, and beans. Cooking  Cook foods with extra-virgin olive oil instead of using butter or other vegetable oils. Have meat as a side dish, and have vegetables or grains as your main dish. This means having meat in small portions or adding small amounts of meat to foods like pasta or stew. Use beans or vegetables instead of meat in common dishes like chili or lasagna. Experiment with different cooking methods. Try roasting or broiling vegetables instead of steaming or sauteing them. Add frozen vegetables to soups, stews, pasta, or rice. Add nuts or seeds for added healthy fat at each meal. You can add these to yogurt, salads, or vegetable dishes. Marinate fish or vegetables using olive oil, lemon juice, garlic, and fresh herbs. Meal planning  Plan to eat 1 vegetarian meal one day each week. Try to work up to 2 vegetarian meals, if possible. Eat seafood 2 or more times a week. Have healthy snacks readily available, such as: Vegetable sticks with hummus. Greek yogurt. Fruit and nut trail mix. Eat balanced meals throughout the week. This includes: Fruit: 2-3 servings a day Vegetables: 4-5 servings a day Low-fat dairy: 2 servings a day Fish, poultry, or lean meat: 1 serving a day Beans and legumes: 2 or more servings a week Nuts and seeds: 1-2 servings a day Whole grains: 6-8 servings a day Extra-virgin olive oil: 3-4 servings a day Limit  red meat and sweets to only a few servings a month What are my food choices? Mediterranean diet Recommended Grains: Whole-grain pasta. Brown rice. Bulgar wheat. Polenta. Couscous. Whole-wheat bread. Orpah Cobb. Vegetables: Artichokes. Beets. Broccoli. Cabbage. Carrots. Eggplant. Green beans. Chard. Kale. Spinach. Onions. Leeks. Peas. Squash. Tomatoes. Peppers. Radishes. Fruits: Apples. Apricots. Avocado. Berries. Bananas. Cherries. Dates. Figs. Grapes. Lemons. Melon. Oranges. Peaches. Plums. Pomegranate. Meats and other protein foods: Beans. Almonds. Sunflower seeds. Pine nuts. Peanuts. Cod. Salmon. Scallops. Shrimp. Tuna. Tilapia. Clams. Oysters. Eggs. Dairy: Low-fat milk. Cheese. Greek yogurt. Beverages: Water. Red wine. Herbal tea. Fats and oils: Extra virgin olive oil. Avocado oil. Grape seed oil. Sweets and desserts: [place] yogurt with honey. Baked apples. Poached pears. Trail mix. Seasoning and other foods: Basil. Cilantro. Coriander. Cumin. Mint. Parsley. Sage. Rosemary. Tarragon. Garlic. Oregano. Thyme. Pepper. Balsalmic vinegar. Tahini. Hummus. Tomato sauce. Olives. Mushrooms. Limit these Grains: Prepackaged pasta or rice dishes. Prepackaged cereal with added sugar. Vegetables: Deep fried potatoes (french fries). Fruits: Fruit canned in syrup. Meats and other protein foods: Beef. Pork. Lamb. Poultry with skin. Hot dogs. Tomasa Blase. Dairy: Ice cream. Sour cream. Whole milk. Beverages: Juice. Sugar-sweetened soft drinks.  Beer. Liquor and spirits. Fats and oils: Butter. Canola oil. Vegetable oil. Beef fat (tallow). Lard. Sweets and desserts: Cookies. Cakes. Pies. Candy. Seasoning and other foods: Mayonnaise. Premade sauces and marinades. The items listed may not be a complete list. Talk with your dietitian about what dietary choices are right for you. Summary The Mediterranean diet includes both food and lifestyle choices. Eat a variety of fresh fruits and vegetables, beans, nuts,  seeds, and whole grains. Limit the amount of red meat and sweets that you eat. Talk with your health care provider about whether it is safe for you to drink red wine in moderation. This means 1 glass a day for nonpregnant women and 2 glasses a day for men. A glass of wine equals 5 oz (150 mL). This information is not intended to replace advice given to you by your health care provider. Make sure you discuss any questions you have with your health care provider. Document Released: 10/12/2015 Document Revised: 11/14/2015 Document Reviewed: 10/12/2015 Elsevier Interactive Patient Education  2017 [employer].

## 2023-02-04 NOTE — Progress Notes (Signed)
Assessment/Plan:   Memory Impairment of unclear etiology  Angel Tanner is a very pleasant 82 y.o. RH female with a history of hypertension, hyperlipidemia, DM2, asthma, CKD3, COPD asthma, glaucoma, OSA unable to tolerate CPAP, Ascending Aortic Aneurysm, GERD seen today in follow up to discuss the MRI of the brain results. These were personally reviewed, remarkable for cerebral volume loss and L occipital meningioma, slightly larger than the findings on 2008. She denies any headaches or vision loss.She was first seen on 12/12/2022 with a named MoCA of 20/30. Patient is not on antidementia medication.  This patient is here with her son and friend. Previous records as well as any outside records available were reviewed prior to todays visit.     Follow up in 3  months. Start donepezil  5 mg daily as directed, side effects discussed, goal is to increase dose to 10 mg daily if tolerated.   Replenish B12 (218) Repeat MRI brain with contrast to further evaluate meningioma She is scheduled for neuropsych evaluation for clarity of diagnosis in the near future Continue to control mood as per PCP Recommend good control of cardiovascular risk factors   Initial visit 12/12/2022 How long did patient have memory difficulties?  For about 1 -2 months.  Patient reports some difficulty remembering new information, recent conversations, names. She forgets appointments. Writes sticky notes and hints to remember . Likes to do crossword puzzles and online games.  Likes going to [employer].   repeats oneself?   Denies  Disoriented when walking into a room?  Patient denies.    Leaving objects in unusual places?  denies   Wandering behavior? denies .  Any personality changes, or depression, anxiety? Denies  Hallucinations or paranoia? Denies.   Seizures? Denies.    Any sleep changes?  Sleeps well, but I don't sleep more than 6 hours, denies vivid dreams, REM behavior or sleepwalking   Sleep apnea? Denies.    Any hygiene concerns?  Denies.   Independent of bathing and dressing? Endorsed  Does the patient need help with medications? Patient is in charge   Who is in charge of the finances? Patient is in charge     Any changes in appetite?   Denies.     Patient have trouble swallowing?  Denies.   Does the patient cook? Yes. denies any kitchen accidents  Any headaches?  Denies.   Chronic pain? Denies.   Ambulates with difficulty? Denies. Does not participate on physical activity classes  Recent falls or head injuries? Denies.     Vision changes?  Denies any new issues. She is a  " glaucoma watch patient " Any strokelike symptoms? Denies.   Any tremors? Denies.   Any anosmia? Denies.   Any incontinence of urine? Denies. Uses past sometimes "just in case" Any bowel dysfunction? Denies  Patient lives with her sister   History of heavy alcohol intake? Denies.   History of heavy tobacco use? Denies.   Family history of dementia?  Denies  Drives? Yes, denies any issues  Retired from Restaurant manager, fast food.      CURRENT MEDICATIONS:  Outpatient Encounter Medications as of 02/04/2023  Medication Sig   Albuterol Sulfate (PROAIR RESPICLICK) 108 (90 Base) MCG/ACT AEPB INHALE TWO PUFFS into lungs EVERY 4 HOURS AS NEEDED FOR WHEEZING, FOR SHORTNESS OF BREATH AND FOR tightness   aspirin 81 MG chewable tablet Chew 81 mg by mouth at bedtime.    Calcium Carb-Cholecalciferol (CALCIUM + D3 PO) Take 1 tablet by  mouth daily.   hydrochlorothiazide (HYDRODIURIL) 12.5 MG tablet Take 12.5 mg by mouth every morning.   hydrocortisone (ANUSOL-HC) 25 MG suppository Place 25 mg rectally daily as needed for hemorrhoids.   INVOKANA 300 MG TABS tablet TAKE ONE TABLET BY MOUTH BEFORE BREAKFAST   latanoprost (XALATAN) 0.005 % ophthalmic solution Place 1 drop into both eyes at bedtime.   lisinopril (ZESTRIL) 10 MG tablet Take 1 tablet by mouth daily.   metFORMIN (GLUCOPHAGE-XR) 500 MG 24 hr tablet Take 2 tablets (1,000 mg  total) by mouth daily with supper.   ondansetron (ZOFRAN) 4 MG tablet Take 1 tablet (4 mg total) by mouth daily as needed. (Patient not taking: Reported on 12/27/2022)   ONETOUCH VERIO test strip USE TO CHECK BLOOD GLUCOSE ONCE DAILY   oxyCODONE (ROXICODONE) 5 MG immediate release tablet Take 1 tablet (5 mg total) by mouth every 8 (eight) hours as needed for up to 8 doses for severe pain or breakthrough pain. (Patient not taking: Reported on 12/27/2022)   Polyethyl Glycol-Propyl Glycol 0.4-0.3 % SOLN Place 1 drop into both eyes 2 (two) times daily as needed (dry/irritated eyes.).   rosuvastatin (CRESTOR) 10 MG tablet Take 10 mg by mouth at bedtime.    No facility-administered encounter medications on file as of 02/04/2023.        No data to display            12/30/2022   10:00 AM 12/12/2022    9:00 AM  Montreal Cognitive Assessment   Visuospatial/ Executive (0/5) 3 4  Naming (0/3) 3 3  Attention: Read list of digits (0/2) 2 2  Attention: Read list of letters (0/1) 1 1  Attention: Serial 7 subtraction starting at 100 (0/3) 1 1  Language: Repeat phrase (0/2) 2 2  Language : Fluency (0/1) 1 1  Abstraction (0/2) 1 1  Delayed Recall (0/5) 0 0  Orientation (0/6) 5 5  Total 19 20  Adjusted Score (based on education) 19 20   Thank you for allowing [place] the opportunity to participate in the care of this nice patient. Please do not hesitate to contact us for any questions or concerns.   Total time spent on today's visit was 40 minutes dedicated to this patient today, preparing to see patient, examining the patient, ordering tests and/or medications and counseling the patient, documenting clinical information in the EHR or other health record, independently interpreting results and communicating results to the patient/family, discussing treatment and goals, answering patient's questions and coordinating care.  Cc:  Renford Dills, MD  [name] 02/04/2023 7:00 AM

## 2023-02-05 DIAGNOSIS — Z1231 Encounter for screening mammogram for malignant neoplasm of breast: Secondary | ICD-10-CM | POA: Diagnosis not present

## 2023-02-18 ENCOUNTER — Encounter: Payer: Self-pay | Admitting: Pulmonary Disease

## 2023-02-18 ENCOUNTER — Ambulatory Visit: Payer: PPO | Admitting: Pulmonary Disease

## 2023-02-18 ENCOUNTER — Other Ambulatory Visit: Payer: Self-pay | Admitting: Pulmonary Disease

## 2023-02-18 VITALS — BP 136/94 | Temp 98.0°F | Ht 61.5 in | Wt 176.4 lb

## 2023-02-18 DIAGNOSIS — R0602 Shortness of breath: Secondary | ICD-10-CM

## 2023-02-18 MED ORDER — BUDESONIDE-FORMOTEROL FUMARATE 160-4.5 MCG/ACT IN AERO: 2.0000 | INHALATION_SPRAY | Freq: Two times a day (BID) | RESPIRATORY_TRACT | 12 refills | Status: DC

## 2023-02-18 MED ORDER — ALBUTEROL SULFATE HFA 108 (90 BASE) MCG/ACT IN AERS
2.0000 | INHALATION_SPRAY | Freq: Four times a day (QID) | RESPIRATORY_TRACT | 2 refills | Status: AC | PRN
Start: 2023-02-18 — End: ?

## 2023-02-19 NOTE — Progress Notes (Signed)
Synopsis: Referred in by Antonieta Iba, MD   Subjective:   PATIENT ID: Angel Tanner GENDER: female DOB: [birth year], MRN: [id number]  Chief Complaint  Patient presents with   Consult    Shortness of breath on exertion and occasional at rest. Occasional wheezing. No cough.     HPI Angel Tanner is an 82 year old female patientWith a past medical history of type 2 diabetes mellitus, hypertension, hyperlipidemia, moderate persistent asthma presenting today to the pulmonary clinic to establish care.  Patient complaining of chest tightness and shortness of breath that has been going on for years but recently worsened.  She does carry the diagnosis of asthma and has hypersensitivity to high humidity and heat.  Furthermore, she has a known right middle lobe syndrome and was bronched in 2020 with a lesion found in the medial segment of the right middle lobe that was brushed and BAL sent which both were negative for malignancy.  ROS All systems were reviewed and are negative except for the above.  Objective:   Vitals:   02/18/23 1143  BP: (!) 136/94  Temp: 98 F (36.7 C)  TempSrc: Temporal  SpO2: 96%  Weight: 176 lb 6.4 oz (80 kg)  Height: 5' 1.5" (1.562 m)   96% on RA BMI Readings from Last 3 Encounters:  02/18/23 32.79 kg/m  02/04/23 32.60 kg/m  12/27/22 32.60 kg/m   Wt Readings from Last 3 Encounters:  02/18/23 176 lb 6.4 oz (80 kg)  12/27/22 175 lb 6 oz (79.5 kg)  12/26/22 175 lb 9.6 oz (79.7 kg)    Physical Exam GEN: NAD, Healthy Appearing HEENT: Supple Neck, Reactive Pupils, EOMI  CVS: Normal S1, Normal S2, RRR, No murmurs or ES appreciated  Lungs: Clear bilateral air entry.  Abdomen: Soft, non tender, non distended, + BS  Extremities: Warm and well perfused, No edema  Skin: No suspicious lesions appreciated  Psych: Normal Affect  Labs and imaging were reviewed.  Ancillary Information   CBC    Component Value Date/Time   WBC 4.6 10/21/2022 2046    RBC 5.21 (H) 10/21/2022 2046   HGB 15.0 10/21/2022 2046   HCT 49.3 (H) 10/21/2022 2046   PLT 245 10/21/2022 2046   MCV 94.6 10/21/2022 2046   MCH 28.8 10/21/2022 2046   MCHC 30.4 10/21/2022 2046   RDW 13.2 10/21/2022 2046   LYMPHSABS 1.6 10/09/2022 1955   MONOABS 0.3 10/09/2022 1955   EOSABS 0.1 10/09/2022 1955   BASOSABS 0.0 10/09/2022 1955       Latest Ref Rng & Units 06/13/2017    4:04 PM 04/28/2013    1:34 PM  PFT Results  FVC-Pre L 1.40  1.62   FVC-Predicted Pre % 85  91   FVC-Post L 1.30  1.66   FVC-Predicted Post % 79  93   Pre FEV1/FVC % % 65  67   Post FEV1/FCV % % 68  70   FEV1-Pre L 0.91  1.08   FEV1-Predicted Pre % 72  79   FEV1-Post L 0.89  1.16   DLCO uncorrected ml/min/mmHg 16.19  8.74   DLCO UNC% % 88  48   DLCO corrected ml/min/mmHg 16.61    DLCO COR %Predicted % 91    DLVA Predicted % 210  92   TLC L 3.64  4.04   TLC % Predicted % 83  92   RV % Predicted % 105  110      Assessment & Plan:  [name] is an  82 year old female patientWith a past medical history of type 2 diabetes mellitus, hypertension, hyperlipidemia, moderate persistent asthma presenting today to the pulmonary clinic to establish care.  #Moderate Persistent Asthma   []  PFTs  []  Start budesonide-formoterol [Symbicort] 160-4.5 2 puffs bid.  []  Start albuterol as needed   #RMLS  Known since 2020. S/p bronch in 2020 with lesion in the medial segement of the RML brushing negative for malignancy.   Serial imaging for aortic aneuryshm showed worsening RML collapse. Will repeat ct in 3 months and assess need for repeat bronch.   Return in about 2 months (around 04/21/2023) for with CT chest.  I spent 60 minutes caring for this patient today, including preparing to see the patient, obtaining a medical history , reviewing a separately obtained history, performing a medically appropriate examination and/or evaluation, counseling and educating the patient/family/caregiver, documenting  clinical information in the electronic health record, and independently interpreting results (not separately reported/billed) and communicating results to the patient/family/caregiver  Janann Colonel, MD  Pulmonary Critical Care 02/19/2023 4:03 PM

## 2023-02-20 DIAGNOSIS — K5904 Chronic idiopathic constipation: Secondary | ICD-10-CM | POA: Diagnosis not present

## 2023-02-27 ENCOUNTER — Ambulatory Visit: Payer: PPO | Admitting: Physician Assistant

## 2023-02-28 ENCOUNTER — Other Ambulatory Visit: Payer: PPO

## 2023-03-18 ENCOUNTER — Ambulatory Visit
Admission: RE | Admit: 2023-03-18 | Discharge: 2023-03-18 | Disposition: A | Discharge status: Home / Self Care | Payer: PPO | Source: Ambulatory Visit | Attending: Physician Assistant | Admitting: Physician Assistant

## 2023-03-18 MED ORDER — GADOPICLENOL 0.5 MMOL/ML IV SOLN
10.0000 mL | Freq: Once | INTRAVENOUS | Status: AC | PRN
  Administered 2023-03-18: 8 mL via INTRAVENOUS

## 2023-03-27 ENCOUNTER — Encounter: Payer: Self-pay | Admitting: Physician Assistant

## 2023-03-27 NOTE — Progress Notes (Signed)
Spoke w patient about MRI brain results (w and wo c). It now measures 3.4 x 2.5 x 2.2 cm meningioma centered at the left transverse dural sinus which is invaded and occluded at the level of mass. Mild mass effect on the adjacent nonedematous brain. Discussed referral to neurosurgery for possible resection, patient agrees to proceed, appreciative of the call

## 2023-03-31 ENCOUNTER — Encounter: Payer: Self-pay | Admitting: Endocrinology

## 2023-03-31 ENCOUNTER — Ambulatory Visit: Payer: PPO | Admitting: Endocrinology

## 2023-03-31 VITALS — BP 136/88 | HR 86 | Resp 20 | Ht 61.5 in | Wt 176.8 lb

## 2023-03-31 DIAGNOSIS — E1165 Type 2 diabetes mellitus with hyperglycemia: Secondary | ICD-10-CM | POA: Diagnosis not present

## 2023-03-31 DIAGNOSIS — Z7984 Long term (current) use of oral hypoglycemic drugs: Secondary | ICD-10-CM

## 2023-03-31 LAB — POCT GLYCOSYLATED HEMOGLOBIN (HGB A1C): Hemoglobin A1C: 7.7 % — AB (ref 4.0–5.6)
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
## 2023-04-02 ENCOUNTER — Telehealth: Payer: Self-pay | Admitting: Physician Assistant

## 2023-04-02 NOTE — Telephone Encounter (Signed)
Left message with the after hour service on 04-02-23 at 12:27 pm   Caller states that he is returning a call

## 2023-04-02 NOTE — Telephone Encounter (Signed)
[phone] is the number given to [facility].

## 2023-04-22 ENCOUNTER — Ambulatory Visit
Admission: RE | Admit: 2023-04-22 | Discharge: 2023-04-22 | Disposition: A | Discharge status: Home / Self Care | Payer: No Typology Code available for payment source | Source: Ambulatory Visit | Attending: Pulmonary Disease | Admitting: Pulmonary Disease

## 2023-04-22 ENCOUNTER — Other Ambulatory Visit: Payer: PPO

## 2023-04-22 DIAGNOSIS — R0602 Shortness of breath: Secondary | ICD-10-CM | POA: Insufficient documentation

## 2023-04-28 ENCOUNTER — Ambulatory Visit: Payer: PPO | Admitting: Psychology

## 2023-04-28 ENCOUNTER — Ambulatory Visit (INDEPENDENT_AMBULATORY_CARE_PROVIDER_SITE_OTHER): Payer: PPO | Admitting: Psychology

## 2023-04-28 ENCOUNTER — Encounter: Payer: Self-pay | Admitting: Psychology

## 2023-04-28 DIAGNOSIS — G3184 Mild cognitive impairment, so stated: Secondary | ICD-10-CM

## 2023-04-28 DIAGNOSIS — G309 Alzheimer's disease, unspecified: Secondary | ICD-10-CM

## 2023-04-28 DIAGNOSIS — R4189 Other symptoms and signs involving cognitive functions and awareness: Secondary | ICD-10-CM

## 2023-04-28 DIAGNOSIS — M79672 Pain in left foot: Secondary | ICD-10-CM | POA: Insufficient documentation

## 2023-04-28 HISTORY — DX: Mild cognitive impairment of uncertain or unknown etiology: G31.84
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
## 2023-04-28 NOTE — Progress Notes (Signed)
   Psychometrician Note   Cognitive testing was administered to [name] by Wallace Keller, B.S. (psychometrist) under the supervision of Dr. Newman Nickels, Ph.D., ABPP, licensed psychologist on 04/28/2023. Angel Tanner did not appear overtly distressed by the testing session per behavioral observation or responses across self-report questionnaires. Rest breaks were offered.    The battery of tests administered was selected by Dr. Newman Nickels, Ph.D., ABPP with consideration to Angel Tanner's current level of functioning, the nature of her symptoms, emotional and behavioral responses during interview, level of literacy, observed level of motivation/effort, and the nature of the referral question. This battery was communicated to the psychometrist. Communication between Dr. Newman Nickels, Ph.D., ABPP and the psychometrist was ongoing throughout the evaluation and Dr. Newman Nickels, Ph.D., ABPP was immediately accessible at all times. Dr. Newman Nickels, Ph.D., ABPP provided supervision to the psychometrist on the date of this service to the extent necessary to assure the quality of all services provided.    Angel Tanner will return within approximately 1-2 weeks for an interactive feedback session with Dr. Milbert Coulter at which time her test performances, clinical impressions, and treatment recommendations will be reviewed in detail. Angel Tanner understands she can contact our office should she require our assistance before this time.  A total of 130 minutes of billable time were spent face-to-face with Angel Tanner by the psychometrist. This includes both test administration and scoring time. Billing for these services is reflected in the clinical report generated by Dr. Newman Nickels, Ph.D., ABPP  This note reflects time spent with the psychometrician and does not include test scores or any clinical interpretations made by Dr. Milbert Coulter. The full report will follow in a separate note.

## 2023-05-01 ENCOUNTER — Ambulatory Visit: Payer: No Typology Code available for payment source | Admitting: Pulmonary Disease

## 2023-05-01 VITALS — BP 130/78 | HR 78 | Temp 98.6°F | Ht 61.5 in | Wt 173.0 lb

## 2023-05-01 DIAGNOSIS — J4540 Moderate persistent asthma, uncomplicated: Secondary | ICD-10-CM

## 2023-05-01 MED ORDER — FLUTICASONE FUROATE-VILANTEROL 200-25 MCG/ACT IN AEPB
1.0000 | INHALATION_SPRAY | Freq: Every day | RESPIRATORY_TRACT | 3 refills | Status: DC
Start: 2023-05-01 — End: 2023-12-24

## 2023-05-01 NOTE — Progress Notes (Signed)
 Synopsis: Referred in by Renford Dills, MD   Subjective:   PATIENT ID: Angel Tanner GENDER: female DOB: Jul 21, 1940, MRN: [id number]  Chief Complaint  Patient presents with   Follow-up    HPI [name] is an 83 year old female patientWith a past medical history of type 2 diabetes mellitus, hypertension, hyperlipidemia, moderate persistent asthma presenting today to the pulmonary clinic to establish care.  Patient complaining of chest tightness and shortness of breath that has been going on for years but recently worsened.  She does carry the diagnosis of asthma and has hypersensitivity to high humidity and heat.  Furthermore, she has a known right middle lobe syndrome and was bronched in 2020 with a lesion found in the medial segment of the right middle lobe that was brushed and BAL sent which both were negative for malignancy.  She has not been using her inhaler regularly as she forgets to use it. However, she is not complaining of any breathing issues.   PFTs in 2020 with obstructive defect and normal DLCO.   CT chest in 02/18 with chronic RMLS.   ROS All systems were reviewed and are negative except for the above.  Objective:   There were no vitals filed for this visit.    on RA BMI Readings from Last 3 Encounters:  03/31/23 32.87 kg/m  02/18/23 32.79 kg/m  02/04/23 32.60 kg/m   Wt Readings from Last 3 Encounters:  03/31/23 176 lb 12.8 oz (80.2 kg)  02/18/23 176 lb 6.4 oz (80 kg)  12/27/22 175 lb 6 oz (79.5 kg)    Physical Exam GEN: NAD, Healthy Appearing HEENT: Supple Neck, Reactive Pupils, EOMI  CVS: Normal S1, Normal S2, RRR, No murmurs or ES appreciated  Lungs: Clear bilateral air entry.  Abdomen: Soft, non tender, non distended, + BS  Extremities: Warm and well perfused, No edema  Skin: No suspicious lesions appreciated  Psych: Normal Affect  Labs and imaging were reviewed.  Ancillary Information   CBC    Component Value Date/Time   WBC  4.6 10/21/2022 2046   RBC 5.21 (H) 10/21/2022 2046   HGB 15.0 10/21/2022 2046   HCT 49.3 (H) 10/21/2022 2046   PLT 245 10/21/2022 2046   MCV 94.6 10/21/2022 2046   MCH 28.8 10/21/2022 2046   MCHC 30.4 10/21/2022 2046   RDW 13.2 10/21/2022 2046   LYMPHSABS 1.6 10/09/2022 1955   MONOABS 0.3 10/09/2022 1955   EOSABS 0.1 10/09/2022 1955   BASOSABS 0.0 10/09/2022 1955       Latest Ref Rng & Units 06/13/2017    4:04 PM 04/28/2013    1:34 PM  PFT Results  FVC-Pre L 1.40  1.62   FVC-Predicted Pre % 85  91   FVC-Post L 1.30  1.66   FVC-Predicted Post % 79  93   Pre FEV1/FVC % % 65  67   Post FEV1/FCV % % 68  70   FEV1-Pre L 0.91  1.08   FEV1-Predicted Pre % 72  79   FEV1-Post L 0.89  1.16   DLCO uncorrected ml/min/mmHg 16.19  8.74   DLCO UNC% % 88  48   DLCO corrected ml/min/mmHg 16.61    DLCO COR %Predicted % 91    DLVA Predicted % 210  92   TLC L 3.64  4.04   TLC % Predicted % 83  92   RV % Predicted % 105  110      Assessment & Plan:  [name] is an  83 year old female patientWith a past medical history of type 2 diabetes mellitus, hypertension, hyperlipidemia, moderate persistent asthma presenting today to the pulmonary clinic to establish care.  #Moderate Persistent Asthma   []  D/c budesonide-formoterol [Symbicort] 160-4.5 2 puffs bid.  []  Start Breo Ellipta 1 puff daily.  []  Start albuterol as needed   #RMLS  Known since 2020. S/p bronch in 2020 with lesion in the medial segement of the RML brushing negative for malignancy.   Serial imaging for aortic aneuryshm showed worsening RML collapse. Repeat imaging with similar appearance, will hold off on repeating bronchoscopy and follow clinically.   No follow-ups on file.  I spent 30 minutes caring for this patient today, including preparing to see the patient, obtaining a medical history , reviewing a separately obtained history, performing a medically appropriate examination and/or evaluation, counseling and  educating the patient/family/caregiver, documenting clinical information in the electronic health record, and independently interpreting results (not separately reported/billed) and communicating results to the patient/family/caregiver  Janann Colonel, MD [facility] 05/01/2023 11:33 AM

## 2023-05-05 ENCOUNTER — Ambulatory Visit (INDEPENDENT_AMBULATORY_CARE_PROVIDER_SITE_OTHER): Payer: PPO | Admitting: Psychology

## 2023-05-05 DIAGNOSIS — R4189 Other symptoms and signs involving cognitive functions and awareness: Secondary | ICD-10-CM | POA: Diagnosis not present

## 2023-05-05 DIAGNOSIS — G309 Alzheimer's disease, unspecified: Secondary | ICD-10-CM

## 2023-05-05 NOTE — Progress Notes (Addendum)
   Neuropsychology Feedback Session [name]. [facility] Department of Neurology  Reason for Referral:   Angel Tanner is a 83 y.o. right-handed African-American female referred by [name], PA-C, to characterize her current cognitive functioning and assist with diagnostic clarity and treatment planning in the context of subjective cognitive decline.   Feedback:   [name] completed a comprehensive neuropsychological evaluation on 04/28/2023. Please refer to that encounter for the full report and recommendations. Briefly, results suggested severe impairment surrounding both delayed retrieval and recognition/consolidation aspects of memory. Further performance variability was exhibited across visuospatial abilities. Performances were appropriate relative to age-matched peers across processing speed, attention/concentration, executive functioning, receptive and expressive language, and encoding (i.e., learning) aspects of memory. While the etiology for ongoing memory impairment remains unclear, I do have concerns for an underlying neurodegenerative illness, namely early stages of Alzheimer's disease. Across memory testing, [name] was initially able to learn novel information quite well. However, after a brief delay, she was fully amnestic with retention rates of 0% across all three memory tasks. She likewise performed poorly across all yes/no recognition trials. Taken together, this suggests evidence for rapid forgetting and a pronounced storage impairment, which are the hallmark testing characteristics of this illness. Notable disorientation is common in this presentation. Her discrepancy across verbal fluency tasks (semantic being worse than phonemic) is also a common finding. Given relative strength across the majority of other assessed domains, this illness would appear to be in early stages if truly present.  [name] was unaccompanied during the current telephone  call. She was within her vehicle and commented that she had pulled off to the side of the road while I was within my office. I discussed the limitations of evaluation and management by telemedicine and the availability of in person appointments. [name] expressed her understanding and agreed to proceed. Content of the current session focused on the results of her neuropsychological evaluation. [name] was given the opportunity to ask questions and her questions were answered. She was encouraged to reach out should additional questions arise. A copy of her report was mailed at the conclusion of the visit.      One unit 96132 (31 minutes) was billed for Dr. Tammy Sours time spent preparing for, conducting, and documenting the current feedback session with [name].

## 2023-05-13 ENCOUNTER — Ambulatory Visit: Payer: PPO | Admitting: Physician Assistant

## 2023-05-13 ENCOUNTER — Encounter: Payer: Self-pay | Admitting: Physician Assistant

## 2023-05-13 VITALS — HR 100 | Resp 20 | Ht 61.5 in | Wt 172.0 lb

## 2023-05-13 DIAGNOSIS — G309 Alzheimer's disease, unspecified: Secondary | ICD-10-CM | POA: Diagnosis not present

## 2023-05-13 DIAGNOSIS — G3184 Mild cognitive impairment, so stated: Secondary | ICD-10-CM

## 2023-05-13 DIAGNOSIS — D329 Benign neoplasm of meninges, unspecified: Secondary | ICD-10-CM | POA: Diagnosis not present

## 2023-05-13 MED ORDER — DONEPEZIL HCL 10 MG PO TABS
ORAL_TABLET | ORAL | 3 refills | Status: DC
Start: 2023-05-13 — End: 2023-06-16

## 2023-05-13 MED ORDER — DONEPEZIL HCL 5 MG PO TABS: 10.0000 mg | ORAL_TABLET | Freq: Every day | ORAL | 3 refills | Status: DC

## 2023-05-13 MED ORDER — DONEPEZIL HCL 5 MG PO TABS: ORAL_TABLET | ORAL | 3 refills | Status: DC
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
## 2023-05-13 NOTE — Patient Instructions (Addendum)
 It was a pleasure to see you today at our office.   Recommendations:  Increase Donepezil to 10  mg daily  b12 supplement 1000 micrograms daily Make appointment with neurosurgery for meningioma Follow with pulmonology for OSA  Follow up in 6  months   Referral to neurosurgery for meningioma    For psychiatric meds, mood meds: Please have your primary care physician manage these medications.  If you have any severe symptoms of a stroke, or other severe issues such as confusion,severe chills or fever, etc call 911 or go to the ER as you may need to be evaluated further   For guidance regarding [facility] and if placement were needed at the facility, contact Social Worker tel: [phone]  For assessment of decision of mental capacity and competency:  Call Dr. Erick Blinks, geriatric psychiatrist at [phone]  Counseling regarding caregiver distress, including caregiver depression, anxiety and issues regarding community resources, adult day care programs, adult living facilities, or memory care questions:  please contact your  Primary Doctor's Social Worker   Whom to call: Memory  decline, memory medications: Call our office [phone]    [email or web address]   RECOMMENDATIONS FOR ALL PATIENTS WITH MEMORY PROBLEMS: 1. Continue to exercise (Recommend 30 minutes of walking everyday, or 3 hours every week) 2. Increase social interactions - continue going to [place] and enjoy social gatherings with friends and family 3. Eat healthy, avoid fried foods and eat more fruits and vegetables 4. Maintain adequate blood pressure, blood sugar, and blood cholesterol level. Reducing the risk of stroke and cardiovascular disease also helps promoting better memory. 5. Avoid stressful situations. Live a simple life and avoid aggravations. Organize your time and prepare for the next day in anticipation. 6. Sleep well, avoid  any interruptions of sleep and avoid any distractions in the bedroom that may interfere with adequate sleep quality 7. Avoid sugar, avoid sweets as there is a strong link between excessive sugar intake, diabetes, and cognitive impairment We discussed the Mediterranean diet, which has been shown to help patients reduce the risk of progressive memory disorders and reduces cardiovascular risk. This includes eating fish, eat fruits and green leafy vegetables, nuts like almonds and hazelnuts, walnuts, and also use olive oil. Avoid fast foods and fried foods as much as possible. Avoid sweets and sugar as sugar use has been linked to worsening of memory function.  There is always a concern of gradual progression of memory problems. If this is the case, then we may need to adjust level of care according to patient needs. Support, both to the patient and caregiver, should then be put into place.      You have been referred for a neuropsychological evaluation (i.e., evaluation of memory and thinking abilities). Please bring someone with you to this appointment if possible, as it is helpful for the doctor to hear from both you and another adult who knows you well. Please bring eyeglasses and hearing aids if you wear them.    The evaluation will take approximately 3 hours and has two parts:   The first part is a clinical interview with the neuropsychologist (Dr. Milbert Coulter or Dr. Roseanne Reno). During the interview, the neuropsychologist will speak with you and the individual you brought to the appointment.    The second part of the evaluation is testing with the doctor's technician Annabelle Harman or Selena Batten). During the testing, the technician will ask you to remember different types of material, solve problems, and answer some questionnaires. Your family  member will not be present for this portion of the evaluation.   Please note: We must reserve several hours of the neuropsychologist's time and the psychometrician's time for your  evaluation appointment. As such, there is a No-Show fee of $100. If you are unable to attend any of your appointments, please contact our office as soon as possible to reschedule.      DRIVING: Regarding driving, in patients with progressive memory problems, driving will be impaired. We advise to have someone else do the driving if trouble finding directions or if minor accidents are reported. Independent driving assessment is available to determine safety of driving.   If you are interested in the driving assessment, you can contact the following:  [facility] [employer] [facility] in [place] [phone]  [facility] [phone]  [facility] [phone]  [facility] [phone] or [phone]   FALL PRECAUTIONS: Be cautious when walking. Scan the area for obstacles that may increase the risk of trips and falls. When getting up in the mornings, sit up at the edge of the bed for a few minutes before getting out of bed. Consider elevating the bed at the head end to avoid drop of blood pressure when getting up. Walk always in a well-lit room (use night lights in the walls). Avoid area rugs or power cords from appliances in the middle of the walkways. Use a walker or a cane if necessary and consider physical therapy for balance exercise. Get your eyesight checked regularly.  FINANCIAL OVERSIGHT: Supervision, especially oversight when making financial decisions or transactions is also recommended.  HOME SAFETY: Consider the safety of the kitchen when operating appliances like stoves, microwave oven, and blender. Consider having supervision and share cooking responsibilities until no longer able to participate in those. Accidents with firearms and other hazards in the house should be identified and addressed as well.   ABILITY TO BE LEFT ALONE: If patient is unable to contact 911 operator, consider using LifeLine, or when the need is there, arrange for someone to  stay with patients. Smoking is a fire hazard, consider supervision or cessation. Risk of wandering should be assessed by caregiver and if detected at any point, supervision and safe proof recommendations should be instituted.  MEDICATION SUPERVISION: Inability to self-administer medication needs to be constantly addressed. Implement a mechanism to ensure safe administration of the medications.      Mediterranean Diet A Mediterranean diet refers to food and lifestyle choices that are based on the traditions of countries located on the [place]. This way of eating has been shown to help prevent certain conditions and improve outcomes for people who have chronic diseases, like kidney disease and heart disease. What are tips for following this plan? Lifestyle  Cook and eat meals together with your family, when possible. Drink enough fluid to keep your urine clear or pale yellow. Be physically active every day. This includes: Aerobic exercise like running or swimming. Leisure activities like gardening, walking, or housework. Get 7-8 hours of sleep each night. If recommended by your health care provider, drink red wine in moderation. This means 1 glass a day for nonpregnant women and 2 glasses a day for men. A glass of wine equals 5 oz (150 mL). Reading food labels  Check the serving size of packaged foods. For foods such as rice and pasta, the serving size refers to the amount of cooked product, not dry. Check the total fat in packaged foods. Avoid foods that have saturated fat or trans  fats. Check the ingredients list for added sugars, such as corn syrup. Shopping  At the grocery store, buy most of your food from the areas near the walls of the store. This includes: Fresh fruits and vegetables (produce). Grains, beans, nuts, and seeds. Some of these may be available in unpackaged forms or large amounts (in bulk). Fresh seafood. Poultry and eggs. Low-fat dairy products. Buy whole  ingredients instead of prepackaged foods. Buy fresh fruits and vegetables in-season from local farmers markets. Buy frozen fruits and vegetables in resealable bags. If you do not have access to quality fresh seafood, buy precooked frozen shrimp or canned fish, such as tuna, salmon, or sardines. Buy small amounts of raw or cooked vegetables, salads, or olives from the deli or salad bar at your store. Stock your pantry so you always have certain foods on hand, such as olive oil, canned tuna, canned tomatoes, rice, pasta, and beans. Cooking  Cook foods with extra-virgin olive oil instead of using butter or other vegetable oils. Have meat as a side dish, and have vegetables or grains as your main dish. This means having meat in small portions or adding small amounts of meat to foods like pasta or stew. Use beans or vegetables instead of meat in common dishes like chili or lasagna. Experiment with different cooking methods. Try roasting or broiling vegetables instead of steaming or sauteing them. Add frozen vegetables to soups, stews, pasta, or rice. Add nuts or seeds for added healthy fat at each meal. You can add these to yogurt, salads, or vegetable dishes. Marinate fish or vegetables using olive oil, lemon juice, garlic, and fresh herbs. Meal planning  Plan to eat 1 vegetarian meal one day each week. Try to work up to 2 vegetarian meals, if possible. Eat seafood 2 or more times a week. Have healthy snacks readily available, such as: Vegetable sticks with hummus. Greek yogurt. Fruit and nut trail mix. Eat balanced meals throughout the week. This includes: Fruit: 2-3 servings a day Vegetables: 4-5 servings a day Low-fat dairy: 2 servings a day Fish, poultry, or lean meat: 1 serving a day Beans and legumes: 2 or more servings a week Nuts and seeds: 1-2 servings a day Whole grains: 6-8 servings a day Extra-virgin olive oil: 3-4 servings a day Limit red meat and sweets to only a few servings  a month What are my food choices? Mediterranean diet Recommended Grains: Whole-grain pasta. Brown rice. Bulgar wheat. Polenta. Couscous. Whole-wheat bread. Orpah Cobb. Vegetables: Artichokes. Beets. Broccoli. Cabbage. Carrots. Eggplant. Green beans. Chard. Kale. Spinach. Onions. Leeks. Peas. Squash. Tomatoes. Peppers. Radishes. Fruits: Apples. Apricots. Avocado. Berries. Bananas. Cherries. Dates. Figs. Grapes. Lemons. Melon. Oranges. Peaches. Plums. Pomegranate. Meats and other protein foods: Beans. Almonds. Sunflower seeds. Pine nuts. Peanuts. Cod. Salmon. Scallops. Shrimp. Tuna. Tilapia. Clams. Oysters. Eggs. Dairy: Low-fat milk. Cheese. Greek yogurt. Beverages: Water. Red wine. Herbal tea. Fats and oils: Extra virgin olive oil. Avocado oil. Grape seed oil. Sweets and desserts: [place] yogurt with honey. Baked apples. Poached pears. Trail mix. Seasoning and other foods: Basil. Cilantro. Coriander. Cumin. Mint. Parsley. Sage. Rosemary. Tarragon. Garlic. Oregano. Thyme. Pepper. Balsalmic vinegar. Tahini. Hummus. Tomato sauce. Olives. Mushrooms. Limit these Grains: Prepackaged pasta or rice dishes. Prepackaged cereal with added sugar. Vegetables: Deep fried potatoes (french fries). Fruits: Fruit canned in syrup. Meats and other protein foods: Beef. Pork. Lamb. Poultry with skin. Hot dogs. Tomasa Blase. Dairy: Ice cream. Sour cream. Whole milk. Beverages: Juice. Sugar-sweetened soft drinks. Beer. Liquor and spirits. Fats and  oils: Butter. Canola oil. Vegetable oil. Beef fat (tallow). Lard. Sweets and desserts: Cookies. Cakes. Pies. Candy. Seasoning and other foods: Mayonnaise. Premade sauces and marinades. The items listed may not be a complete list. Talk with your dietitian about what dietary choices are right for you. Summary The Mediterranean diet includes both food and lifestyle choices. Eat a variety of fresh fruits and vegetables, beans, nuts, seeds, and whole grains. Limit the amount of  red meat and sweets that you eat. Talk with your health care provider about whether it is safe for you to drink red wine in moderation. This means 1 glass a day for nonpregnant women and 2 glasses a day for men. A glass of wine equals 5 oz (150 mL). This information is not intended to replace advice given to you by your health care provider. Make sure you discuss any questions you have with your health care provider. Document Released: 10/12/2015 Document Revised: 11/14/2015 Document Reviewed: 10/12/2015 Elsevier Interactive Patient Education  2017 [employer].

## 2023-06-13 ENCOUNTER — Telehealth: Payer: Self-pay | Admitting: Physician Assistant

## 2023-06-13 DIAGNOSIS — G4730 Sleep apnea, unspecified: Secondary | ICD-10-CM | POA: Diagnosis not present

## 2023-06-13 DIAGNOSIS — Z0000 Encounter for general adult medical examination without abnormal findings: Secondary | ICD-10-CM | POA: Diagnosis not present

## 2023-06-13 DIAGNOSIS — I7121 Aneurysm of the ascending aorta, without rupture: Secondary | ICD-10-CM | POA: Diagnosis not present

## 2023-06-13 DIAGNOSIS — E1169 Type 2 diabetes mellitus with other specified complication: Secondary | ICD-10-CM | POA: Diagnosis not present

## 2023-06-13 DIAGNOSIS — Z1331 Encounter for screening for depression: Secondary | ICD-10-CM | POA: Diagnosis not present

## 2023-06-13 DIAGNOSIS — E7800 Pure hypercholesterolemia, unspecified: Secondary | ICD-10-CM | POA: Diagnosis not present

## 2023-06-13 DIAGNOSIS — I2510 Atherosclerotic heart disease of native coronary artery without angina pectoris: Secondary | ICD-10-CM | POA: Diagnosis not present

## 2023-06-13 DIAGNOSIS — Z23 Encounter for immunization: Secondary | ICD-10-CM | POA: Diagnosis not present

## 2023-06-13 DIAGNOSIS — I10 Essential (primary) hypertension: Secondary | ICD-10-CM | POA: Diagnosis not present

## 2023-06-13 DIAGNOSIS — J9819 Other pulmonary collapse: Secondary | ICD-10-CM | POA: Diagnosis not present

## 2023-06-13 DIAGNOSIS — J449 Chronic obstructive pulmonary disease, unspecified: Secondary | ICD-10-CM | POA: Diagnosis not present

## 2023-06-13 NOTE — Telephone Encounter (Signed)
 Pt's son called in and left a message. He stated the pharmacy doesn't have the higher dosage 10 mg of her medication. He would like it sent in so she can start taking the proper dosage. He did not specify pharmacy

## 2023-06-16 ENCOUNTER — Other Ambulatory Visit: Payer: Self-pay

## 2023-06-16 MED ORDER — DONEPEZIL HCL 10 MG PO TABS: ORAL_TABLET | ORAL | 1 refills | Status: DC

## 2023-06-16 NOTE — Telephone Encounter (Signed)
 Rx sent in this am 06/16/2023

## 2023-07-25 ENCOUNTER — Other Ambulatory Visit: Payer: Self-pay

## 2023-07-25 MED ORDER — DONEPEZIL HCL 10 MG PO TABS: ORAL_TABLET | ORAL | 1 refills | Status: DC

## 2023-07-29 ENCOUNTER — Ambulatory Visit: Payer: Self-pay | Admitting: Endocrinology

## 2023-07-29 ENCOUNTER — Encounter: Payer: Self-pay | Admitting: Endocrinology

## 2023-07-29 ENCOUNTER — Ambulatory Visit (INDEPENDENT_AMBULATORY_CARE_PROVIDER_SITE_OTHER): Payer: PPO | Admitting: Endocrinology

## 2023-07-29 VITALS — BP 126/80 | HR 57 | Resp 20 | Ht 61.5 in | Wt 165.4 lb

## 2023-07-29 DIAGNOSIS — E1165 Type 2 diabetes mellitus with hyperglycemia: Secondary | ICD-10-CM | POA: Diagnosis not present

## 2023-07-29 DIAGNOSIS — Z7984 Long term (current) use of oral hypoglycemic drugs: Secondary | ICD-10-CM | POA: Diagnosis not present

## 2023-07-29 LAB — POCT GLYCOSYLATED HEMOGLOBIN (HGB A1C): Hemoglobin A1C: 7.4 % — AB (ref 4.0–5.6)
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
## 2023-08-02 DIAGNOSIS — E663 Overweight: Secondary | ICD-10-CM | POA: Diagnosis not present

## 2023-08-02 DIAGNOSIS — I10 Essential (primary) hypertension: Secondary | ICD-10-CM | POA: Diagnosis not present

## 2023-08-02 DIAGNOSIS — N1831 Chronic kidney disease, stage 3a: Secondary | ICD-10-CM | POA: Diagnosis not present

## 2023-08-02 DIAGNOSIS — E7800 Pure hypercholesterolemia, unspecified: Secondary | ICD-10-CM | POA: Diagnosis not present

## 2023-08-12 DIAGNOSIS — M79672 Pain in left foot: Secondary | ICD-10-CM | POA: Diagnosis not present

## 2023-08-12 DIAGNOSIS — G3184 Mild cognitive impairment, so stated: Secondary | ICD-10-CM | POA: Diagnosis not present

## 2023-08-12 DIAGNOSIS — I10 Essential (primary) hypertension: Secondary | ICD-10-CM | POA: Diagnosis not present

## 2023-08-28 ENCOUNTER — Ambulatory Visit: Admitting: Podiatry

## 2023-10-02 ENCOUNTER — Ambulatory Visit: Admitting: Physician Assistant

## 2023-10-02 ENCOUNTER — Encounter: Payer: Self-pay | Admitting: Physician Assistant

## 2023-10-02 VITALS — BP 183/92 | HR 95 | Resp 20 | Ht 61.5 in | Wt 167.0 lb

## 2023-10-02 DIAGNOSIS — E7800 Pure hypercholesterolemia, unspecified: Secondary | ICD-10-CM | POA: Diagnosis not present

## 2023-10-02 DIAGNOSIS — G309 Alzheimer's disease, unspecified: Secondary | ICD-10-CM

## 2023-10-02 DIAGNOSIS — D329 Benign neoplasm of meninges, unspecified: Secondary | ICD-10-CM | POA: Diagnosis not present

## 2023-10-02 DIAGNOSIS — N1831 Chronic kidney disease, stage 3a: Secondary | ICD-10-CM | POA: Diagnosis not present

## 2023-10-02 DIAGNOSIS — I10 Essential (primary) hypertension: Secondary | ICD-10-CM | POA: Diagnosis not present

## 2023-10-02 DIAGNOSIS — G3184 Mild cognitive impairment, so stated: Secondary | ICD-10-CM

## 2023-10-02 DIAGNOSIS — E663 Overweight: Secondary | ICD-10-CM | POA: Diagnosis not present

## 2023-10-02 DIAGNOSIS — E0822 Diabetes mellitus due to underlying condition with diabetic chronic kidney disease: Secondary | ICD-10-CM | POA: Diagnosis not present
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
## 2023-10-02 NOTE — Patient Instructions (Signed)
 It was a pleasure to see you today at our office.   Recommendations:  Continue Donepezil  to 10  mg daily  Make appointment with neurosurgery for meningioma Repeat MRI brain in 6 months  Follow with pulmonology for OSA  Follow up in 6  months       For psychiatric meds, mood meds: Please have your primary care physician manage these medications.  If you have any severe symptoms of a stroke, or other severe issues such as confusion,severe chills or fever, etc call 911 or go to the ER as you may need to be evaluated further   For guidance regarding [facility] and if placement were needed at the facility, contact Social Worker tel: [phone]  For assessment of decision of mental capacity and competency:  Call Dr. Rosaline Nine, geriatric psychiatrist at [phone]  Counseling regarding caregiver distress, including caregiver depression, anxiety and issues regarding community resources, adult day care programs, adult living facilities, or memory care questions:  please contact your  Primary Doctor's Social Worker   Whom to call: Memory  decline, memory medications: Call our office [phone]    [email or web address]   RECOMMENDATIONS FOR ALL PATIENTS WITH MEMORY PROBLEMS: 1. Continue to exercise (Recommend 30 minutes of walking everyday, or 3 hours every week) 2. Increase social interactions - continue going to [place] and enjoy social gatherings with friends and family 3. Eat healthy, avoid fried foods and eat more fruits and vegetables 4. Maintain adequate blood pressure, blood sugar, and blood cholesterol level. Reducing the risk of stroke and cardiovascular disease also helps promoting better memory. 5. Avoid stressful situations. Live a simple life and avoid aggravations. Organize your time and prepare for the next day in anticipation. 6. Sleep well, avoid any interruptions of sleep and avoid any  distractions in the bedroom that may interfere with adequate sleep quality 7. Avoid sugar, avoid sweets as there is a strong link between excessive sugar intake, diabetes, and cognitive impairment We discussed the Mediterranean diet, which has been shown to help patients reduce the risk of progressive memory disorders and reduces cardiovascular risk. This includes eating fish, eat fruits and green leafy vegetables, nuts like almonds and hazelnuts, walnuts, and also use olive oil. Avoid fast foods and fried foods as much as possible. Avoid sweets and sugar as sugar use has been linked to worsening of memory function.  There is always a concern of gradual progression of memory problems. If this is the case, then we may need to adjust level of care according to patient needs. Support, both to the patient and caregiver, should then be put into place.      You have been referred for a neuropsychological evaluation (i.e., evaluation of memory and thinking abilities). Please bring someone with you to this appointment if possible, as it is helpful for the doctor to hear from both you and another adult who knows you well. Please bring eyeglasses and hearing aids if you wear them.    The evaluation will take approximately 3 hours and has two parts:   The first part is a clinical interview with the neuropsychologist (Dr. Richie or Dr. Jackquline). During the interview, the neuropsychologist will speak with you and the individual you brought to the appointment.    The second part of the evaluation is testing with the doctor's technician Neal or Luke). During the testing, the technician will ask you to remember different types of material, solve problems, and answer some questionnaires. Your family member will  not be present for this portion of the evaluation.   Please note: We must reserve several hours of the neuropsychologist's time and the psychometrician's time for your evaluation appointment. As such, there is  a No-Show fee of $100. If you are unable to attend any of your appointments, please contact our office as soon as possible to reschedule.      DRIVING: Regarding driving, in patients with progressive memory problems, driving will be impaired. We advise to have someone else do the driving if trouble finding directions or if minor accidents are reported. Independent driving assessment is available to determine safety of driving.   If you are interested in the driving assessment, you can contact the following:  [employer] in [place] [phone]  [facility] [phone]  [facility] [phone]  [facility] [phone] or [phone]   FALL PRECAUTIONS: Be cautious when walking. Scan the area for obstacles that may increase the risk of trips and falls. When getting up in the mornings, sit up at the edge of the bed for a few minutes before getting out of bed. Consider elevating the bed at the head end to avoid drop of blood pressure when getting up. Walk always in a well-lit room (use night lights in the walls). Avoid area rugs or power cords from appliances in the middle of the walkways. Use a walker or a cane if necessary and consider physical therapy for balance exercise. Get your eyesight checked regularly.  FINANCIAL OVERSIGHT: Supervision, especially oversight when making financial decisions or transactions is also recommended.  HOME SAFETY: Consider the safety of the kitchen when operating appliances like stoves, microwave oven, and blender. Consider having supervision and share cooking responsibilities until no longer able to participate in those. Accidents with firearms and other hazards in the house should be identified and addressed as well.   ABILITY TO BE LEFT ALONE: If patient is unable to contact 911 operator, consider using LifeLine, or when the need is there, arrange for someone to stay with patients. Smoking is a fire  hazard, consider supervision or cessation. Risk of wandering should be assessed by caregiver and if detected at any point, supervision and safe proof recommendations should be instituted.  MEDICATION SUPERVISION: Inability to self-administer medication needs to be constantly addressed. Implement a mechanism to ensure safe administration of the medications.      Mediterranean Diet A Mediterranean diet refers to food and lifestyle choices that are based on the traditions of countries located on the [employer] [place]. This way of eating has been shown to help prevent certain conditions and improve outcomes for people who have chronic diseases, like kidney disease and heart disease. What are tips for following this plan? Lifestyle  Cook and eat meals together with your family, when possible. Drink enough fluid to keep your urine clear or pale yellow. Be physically active every day. This includes: Aerobic exercise like running or swimming. Leisure activities like gardening, walking, or housework. Get 7-8 hours of sleep each night. If recommended by your health care provider, drink red wine in moderation. This means 1 glass a day for nonpregnant women and 2 glasses a day for men. A glass of wine equals 5 oz (150 mL). Reading food labels  Check the serving size of packaged foods. For foods such as rice and pasta, the serving size refers to the amount of cooked product, not dry. Check the total fat in packaged foods. Avoid foods that have saturated fat or trans fats. Check  the ingredients list for added sugars, such as corn syrup. Shopping  At the grocery store, buy most of your food from the areas near the walls of the store. This includes: Fresh fruits and vegetables (produce). Grains, beans, nuts, and seeds. Some of these may be available in unpackaged forms or large amounts (in bulk). Fresh seafood. Poultry and eggs. Low-fat dairy products. Buy whole ingredients instead of prepackaged  foods. Buy fresh fruits and vegetables in-season from local farmers markets. Buy frozen fruits and vegetables in resealable bags. If you do not have access to quality fresh seafood, buy precooked frozen shrimp or canned fish, such as tuna, salmon, or sardines. Buy small amounts of raw or cooked vegetables, salads, or olives from the deli or salad bar at your store. Stock your pantry so you always have certain foods on hand, such as olive oil, canned tuna, canned tomatoes, rice, pasta, and beans. Cooking  Cook foods with extra-virgin olive oil instead of using butter or other vegetable oils. Have meat as a side dish, and have vegetables or grains as your main dish. This means having meat in small portions or adding small amounts of meat to foods like pasta or stew. Use beans or vegetables instead of meat in common dishes like chili or lasagna. Experiment with different cooking methods. Try roasting or broiling vegetables instead of steaming or sauteing them. Add frozen vegetables to soups, stews, pasta, or rice. Add nuts or seeds for added healthy fat at each meal. You can add these to yogurt, salads, or vegetable dishes. Marinate fish or vegetables using olive oil, lemon juice, garlic, and fresh herbs. Meal planning  Plan to eat 1 vegetarian meal one day each week. Try to work up to 2 vegetarian meals, if possible. Eat seafood 2 or more times a week. Have healthy snacks readily available, such as: Vegetable sticks with hummus. Greek yogurt. Fruit and nut trail mix. Eat balanced meals throughout the week. This includes: Fruit: 2-3 servings a day Vegetables: 4-5 servings a day Low-fat dairy: 2 servings a day Fish, poultry, or lean meat: 1 serving a day Beans and legumes: 2 or more servings a week Nuts and seeds: 1-2 servings a day Whole grains: 6-8 servings a day Extra-virgin olive oil: 3-4 servings a day Limit red meat and sweets to only a few servings a month What are my food  choices? Mediterranean diet Recommended Grains: Whole-grain pasta. Brown rice. Bulgar wheat. Polenta. Couscous. Whole-wheat bread. [place] Madeira. Vegetables: Artichokes. Beets. Broccoli. Cabbage. Carrots. Eggplant. Green beans. Chard. Kale. Spinach. Onions. Leeks. Peas. Squash. Tomatoes. Peppers. Radishes. Fruits: Apples. Apricots. Avocado. Berries. Bananas. Cherries. Dates. Figs. Grapes. Lemons. Melon. Oranges. Peaches. Plums. Pomegranate. Meats and other protein foods: Beans. Almonds. Sunflower seeds. Pine nuts. Peanuts. Cod. Salmon. Scallops. Shrimp. Tuna. Tilapia. Clams. Oysters. Eggs. Dairy: Low-fat milk. Cheese. Greek yogurt. Beverages: Water. Red wine. Herbal tea. Fats and oils: Extra virgin olive oil. Avocado oil. Grape seed oil. Sweets and desserts: [place] yogurt with honey. Baked apples. Poached pears. Trail mix. Seasoning and other foods: Basil. Cilantro. Coriander. Cumin. Mint. Parsley. Sage. Rosemary. Tarragon. Garlic. Oregano. Thyme. Pepper. Balsalmic vinegar. Tahini. Hummus. Tomato sauce. Olives. Mushrooms. Limit these Grains: Prepackaged pasta or rice dishes. Prepackaged cereal with added sugar. Vegetables: Deep fried potatoes (french fries). Fruits: Fruit canned in syrup. Meats and other protein foods: Beef. Pork. Lamb. Poultry with skin. Hot dogs. [place]. Dairy: Ice cream. Sour cream. Whole milk. Beverages: Juice. Sugar-sweetened soft drinks. Beer. Liquor and spirits. Fats and oils: Butter.  Canola oil. Vegetable oil. Beef fat (tallow). Lard. Sweets and desserts: Cookies. Cakes. Pies. Candy. Seasoning and other foods: Mayonnaise. Premade sauces and marinades. The items listed may not be a complete list. Talk with your dietitian about what dietary choices are right for you. Summary The Mediterranean diet includes both food and lifestyle choices. Eat a variety of fresh fruits and vegetables, beans, nuts, seeds, and whole grains. Limit the amount of red meat and sweets that  you eat. Talk with your health care provider about whether it is safe for you to drink red wine in moderation. This means 1 glass a day for nonpregnant women and 2 glasses a day for men. A glass of wine equals 5 oz (150 mL). This information is not intended to replace advice given to you by your health care provider. Make sure you discuss any questions you have with your health care provider. Document Released: 10/12/2015 Document Revised: 11/14/2015 Document Reviewed: 10/12/2015 Elsevier Interactive Patient Education  2017 [employer].

## 2023-10-17 ENCOUNTER — Ambulatory Visit: Admitting: Neurosurgery

## 2023-10-21 ENCOUNTER — Ambulatory Visit: Admitting: Neurosurgery

## 2023-10-21 ENCOUNTER — Encounter: Payer: Self-pay | Admitting: Neurosurgery

## 2023-10-21 VITALS — BP 145/92 | HR 62 | Ht 61.5 in | Wt 164.0 lb

## 2023-10-21 DIAGNOSIS — D329 Benign neoplasm of meninges, unspecified: Secondary | ICD-10-CM
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
## 2023-10-25 ENCOUNTER — Other Ambulatory Visit: Payer: Self-pay | Admitting: Endocrinology

## 2023-10-25 DIAGNOSIS — E119 Type 2 diabetes mellitus without complications: Secondary | ICD-10-CM

## 2023-11-02 DIAGNOSIS — E663 Overweight: Secondary | ICD-10-CM | POA: Diagnosis not present

## 2023-11-02 DIAGNOSIS — E7800 Pure hypercholesterolemia, unspecified: Secondary | ICD-10-CM | POA: Diagnosis not present

## 2023-11-02 DIAGNOSIS — E0822 Diabetes mellitus due to underlying condition with diabetic chronic kidney disease: Secondary | ICD-10-CM | POA: Diagnosis not present

## 2023-11-02 DIAGNOSIS — I10 Essential (primary) hypertension: Secondary | ICD-10-CM | POA: Diagnosis not present

## 2023-11-02 DIAGNOSIS — N1831 Chronic kidney disease, stage 3a: Secondary | ICD-10-CM | POA: Diagnosis not present

## 2023-11-13 ENCOUNTER — Encounter: Payer: Self-pay | Admitting: Physician Assistant

## 2023-11-13 ENCOUNTER — Ambulatory Visit: Admitting: Physician Assistant

## 2023-11-25 ENCOUNTER — Ambulatory Visit: Payer: Self-pay

## 2023-11-25 ENCOUNTER — Institutional Professional Consult (permissible substitution): Payer: PPO | Admitting: Psychology

## 2023-12-01 ENCOUNTER — Ambulatory Visit: Payer: Self-pay | Admitting: Endocrinology

## 2023-12-01 ENCOUNTER — Encounter: Payer: Self-pay | Admitting: Endocrinology

## 2023-12-01 ENCOUNTER — Ambulatory Visit: Admitting: Endocrinology

## 2023-12-01 VITALS — BP 138/82 | HR 75 | Resp 20 | Ht 61.5 in | Wt 168.2 lb

## 2023-12-01 DIAGNOSIS — E119 Type 2 diabetes mellitus without complications: Secondary | ICD-10-CM | POA: Diagnosis not present

## 2023-12-01 DIAGNOSIS — E1165 Type 2 diabetes mellitus with hyperglycemia: Secondary | ICD-10-CM | POA: Diagnosis not present

## 2023-12-01 LAB — BASIC METABOLIC PANEL WITH GFR
BUN/Creatinine Ratio: 30 (calc) — ABNORMAL HIGH (ref 6–22)
BUN: 28 mg/dL — ABNORMAL HIGH (ref 7–25)
CO2: 26 mmol/L (ref 20–32)
Calcium: 9.8 mg/dL (ref 8.6–10.4)
Chloride: 100 mmol/L (ref 98–110)
Creat: 0.92 mg/dL (ref 0.60–0.95)
Glucose, Bld: 129 mg/dL — ABNORMAL HIGH (ref 65–99)
Potassium: 4.3 mmol/L (ref 3.5–5.3)
Sodium: 140 mmol/L (ref 135–146)
eGFR: 62 mL/min/1.73m2 (ref >=60)

## 2023-12-01 LAB — POCT GLYCOSYLATED HEMOGLOBIN (HGB A1C): Hemoglobin A1C: 7.7 % — AB (ref 4.0–5.6)

## 2023-12-01 LAB — MICROALBUMIN / CREATININE URINE RATIO
Creatinine, Urine: 56 mg/dL (ref 20–275)
Microalb Creat Ratio: 70 mg/g{creat} — ABNORMAL HIGH (ref <30)
Microalb, Ur: 3.9 mg/dL

## 2023-12-01 MED ORDER — METFORMIN HCL ER 500 MG PO TB24
1500.0000 mg | ORAL_TABLET | Freq: Every day | ORAL | 3 refills | Status: DC
Start: 2023-12-01 — End: 2024-01-02
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
## 2023-12-02 ENCOUNTER — Encounter: Payer: PPO | Admitting: Psychology

## 2023-12-02 DIAGNOSIS — N1831 Chronic kidney disease, stage 3a: Secondary | ICD-10-CM | POA: Diagnosis not present

## 2023-12-02 DIAGNOSIS — I10 Essential (primary) hypertension: Secondary | ICD-10-CM | POA: Diagnosis not present

## 2023-12-02 DIAGNOSIS — E0822 Diabetes mellitus due to underlying condition with diabetic chronic kidney disease: Secondary | ICD-10-CM | POA: Diagnosis not present

## 2023-12-02 DIAGNOSIS — E7800 Pure hypercholesterolemia, unspecified: Secondary | ICD-10-CM | POA: Diagnosis not present

## 2023-12-02 DIAGNOSIS — E663 Overweight: Secondary | ICD-10-CM | POA: Diagnosis not present

## 2023-12-16 DIAGNOSIS — G3184 Mild cognitive impairment, so stated: Secondary | ICD-10-CM | POA: Diagnosis not present

## 2023-12-16 DIAGNOSIS — R001 Bradycardia, unspecified: Secondary | ICD-10-CM | POA: Diagnosis not present

## 2023-12-16 DIAGNOSIS — Z23 Encounter for immunization: Secondary | ICD-10-CM | POA: Diagnosis not present

## 2023-12-23 ENCOUNTER — Inpatient Hospital Stay (HOSPITAL_COMMUNITY): Admission: EM | Admit: 2023-12-23 | Discharge: 2023-12-26 | DRG: 243 | Disposition: A | Discharge status: Home Health

## 2023-12-23 ENCOUNTER — Emergency Department (HOSPITAL_COMMUNITY)

## 2023-12-23 ENCOUNTER — Encounter (HOSPITAL_COMMUNITY): Payer: Self-pay | Admitting: Internal Medicine

## 2023-12-23 DIAGNOSIS — Z8249 Family history of ischemic heart disease and other diseases of the circulatory system: Secondary | ICD-10-CM | POA: Diagnosis not present

## 2023-12-23 DIAGNOSIS — E1159 Type 2 diabetes mellitus with other circulatory complications: Secondary | ICD-10-CM | POA: Diagnosis not present

## 2023-12-23 DIAGNOSIS — Z950 Presence of cardiac pacemaker: Secondary | ICD-10-CM | POA: Diagnosis not present

## 2023-12-23 DIAGNOSIS — Z833 Family history of diabetes mellitus: Secondary | ICD-10-CM

## 2023-12-23 DIAGNOSIS — I700 Atherosclerosis of aorta: Secondary | ICD-10-CM | POA: Diagnosis not present

## 2023-12-23 DIAGNOSIS — E7800 Pure hypercholesterolemia, unspecified: Secondary | ICD-10-CM | POA: Diagnosis present

## 2023-12-23 DIAGNOSIS — Z79899 Other long term (current) drug therapy: Secondary | ICD-10-CM

## 2023-12-23 DIAGNOSIS — Z7984 Long term (current) use of oral hypoglycemic drugs: Secondary | ICD-10-CM | POA: Diagnosis not present

## 2023-12-23 DIAGNOSIS — E1165 Type 2 diabetes mellitus with hyperglycemia: Secondary | ICD-10-CM | POA: Diagnosis not present

## 2023-12-23 DIAGNOSIS — E119 Type 2 diabetes mellitus without complications: Secondary | ICD-10-CM

## 2023-12-23 DIAGNOSIS — F0390 Unspecified dementia without behavioral disturbance: Secondary | ICD-10-CM | POA: Diagnosis present

## 2023-12-23 DIAGNOSIS — F17200 Nicotine dependence, unspecified, uncomplicated: Secondary | ICD-10-CM | POA: Diagnosis present

## 2023-12-23 DIAGNOSIS — J432 Centrilobular emphysema: Secondary | ICD-10-CM | POA: Diagnosis present

## 2023-12-23 DIAGNOSIS — E872 Acidosis, unspecified: Secondary | ICD-10-CM | POA: Diagnosis not present

## 2023-12-23 DIAGNOSIS — J4489 Other specified chronic obstructive pulmonary disease: Secondary | ICD-10-CM | POA: Diagnosis present

## 2023-12-23 DIAGNOSIS — I459 Conduction disorder, unspecified: Secondary | ICD-10-CM

## 2023-12-23 DIAGNOSIS — E875 Hyperkalemia: Secondary | ICD-10-CM | POA: Diagnosis present

## 2023-12-23 DIAGNOSIS — I10 Essential (primary) hypertension: Secondary | ICD-10-CM | POA: Diagnosis present

## 2023-12-23 DIAGNOSIS — G4733 Obstructive sleep apnea (adult) (pediatric): Secondary | ICD-10-CM | POA: Diagnosis present

## 2023-12-23 DIAGNOSIS — H409 Unspecified glaucoma: Secondary | ICD-10-CM | POA: Diagnosis present

## 2023-12-23 DIAGNOSIS — I2510 Atherosclerotic heart disease of native coronary artery without angina pectoris: Secondary | ICD-10-CM | POA: Diagnosis present

## 2023-12-23 DIAGNOSIS — I453 Trifascicular block: Secondary | ICD-10-CM | POA: Diagnosis present

## 2023-12-23 DIAGNOSIS — I442 Atrioventricular block, complete: Principal | ICD-10-CM | POA: Diagnosis present

## 2023-12-23 DIAGNOSIS — I4719 Other supraventricular tachycardia: Secondary | ICD-10-CM | POA: Diagnosis present

## 2023-12-23 DIAGNOSIS — Z7951 Long term (current) use of inhaled steroids: Secondary | ICD-10-CM

## 2023-12-23 DIAGNOSIS — F03B18 Unspecified dementia, moderate, with other behavioral disturbance: Secondary | ICD-10-CM | POA: Diagnosis not present

## 2023-12-23 DIAGNOSIS — R9431 Abnormal electrocardiogram [ECG] [EKG]: Secondary | ICD-10-CM | POA: Diagnosis not present

## 2023-12-23 DIAGNOSIS — R0902 Hypoxemia: Secondary | ICD-10-CM | POA: Diagnosis not present

## 2023-12-23 DIAGNOSIS — J449 Chronic obstructive pulmonary disease, unspecified: Secondary | ICD-10-CM | POA: Diagnosis present

## 2023-12-23 DIAGNOSIS — I77810 Thoracic aortic ectasia: Secondary | ICD-10-CM | POA: Diagnosis not present

## 2023-12-23 DIAGNOSIS — E8729 Other acidosis: Secondary | ICD-10-CM

## 2023-12-23 DIAGNOSIS — R001 Bradycardia, unspecified: Principal | ICD-10-CM | POA: Diagnosis present

## 2023-12-23 DIAGNOSIS — N179 Acute kidney failure, unspecified: Secondary | ICD-10-CM | POA: Diagnosis not present

## 2023-12-23 DIAGNOSIS — Z01810 Encounter for preprocedural cardiovascular examination: Secondary | ICD-10-CM | POA: Diagnosis not present

## 2023-12-23 LAB — CBC WITH DIFFERENTIAL/PLATELET
Abs Immature Granulocytes: 0.01 K/uL (ref 0.00–0.07)
Basophils Absolute: 0 K/uL (ref 0.0–0.1)
Basophils Relative: 0 %
Eosinophils Absolute: 0 K/uL (ref 0.0–0.5)
Eosinophils Relative: 1 %
HCT: 49 % — ABNORMAL HIGH (ref 36.0–46.0)
Hemoglobin: 14.8 g/dL (ref 12.0–15.0)
Immature Granulocytes: 0 %
Lymphocytes Relative: 37 %
Lymphs Abs: 1.2 K/uL (ref 0.7–4.0)
MCH: 28.4 pg (ref 26.0–34.0)
MCHC: 30.2 g/dL (ref 30.0–36.0)
MCV: 93.9 fL (ref 80.0–100.0)
Monocytes Absolute: 0.2 K/uL (ref 0.1–1.0)
Monocytes Relative: 5 %
Neutro Abs: 1.8 K/uL (ref 1.7–7.7)
Neutrophils Relative %: 57 %
Platelets: 188 K/uL (ref 150–400)
RBC: 5.22 MIL/uL — ABNORMAL HIGH (ref 3.87–5.11)
RDW: 13.2 % (ref 11.5–15.5)
WBC: 3.2 K/uL — ABNORMAL LOW (ref 4.0–10.5)
nRBC: 0 % (ref 0.0–0.2)

## 2023-12-23 LAB — I-STAT CHEM 8, ED
BUN: 11 mg/dL (ref 8–23)
Calcium, Ion: 1.18 mmol/L (ref 1.15–1.40)
Chloride: 100 mmol/L (ref 98–111)
Creatinine, Ser: 0.7 mg/dL (ref 0.44–1.00)
Glucose, Bld: 150 mg/dL — ABNORMAL HIGH (ref 70–99)
HCT: 48 % — ABNORMAL HIGH (ref 36.0–46.0)
Hemoglobin: 16.3 g/dL — ABNORMAL HIGH (ref 12.0–15.0)
Potassium: 4 mmol/L (ref 3.5–5.1)
Sodium: 141 mmol/L (ref 135–145)
TCO2: 31 mmol/L (ref 22–32)

## 2023-12-23 LAB — TSH: TSH: 1.426 u[IU]/mL (ref 0.350–4.500)

## 2023-12-23 MED ORDER — ASPIRIN 81 MG PO CHEW
81.0000 mg | CHEWABLE_TABLET | Freq: Every day | ORAL | Status: DC
  Administered 2023-12-23 – 2023-12-25 (×3): 81 mg via ORAL
  Filled 2023-12-23 (×3): qty 1

## 2023-12-23 MED ORDER — ROSUVASTATIN CALCIUM 5 MG PO TABS
10.0000 mg | ORAL_TABLET | Freq: Every day | ORAL | Status: DC
  Administered 2023-12-23 – 2023-12-25 (×3): 10 mg via ORAL
  Filled 2023-12-23 (×3): qty 2

## 2023-12-23 MED ORDER — ALBUTEROL SULFATE (2.5 MG/3ML) 0.083% IN NEBU: 2.5000 mg | INHALATION_SOLUTION | Freq: Four times a day (QID) | RESPIRATORY_TRACT | Status: DC | PRN

## 2023-12-23 MED ORDER — ACETAMINOPHEN 325 MG PO TABS: 650.0000 mg | ORAL_TABLET | Freq: Four times a day (QID) | ORAL | Status: DC | PRN

## 2023-12-23 MED ORDER — LISINOPRIL 10 MG PO TABS: 10.0000 mg | ORAL_TABLET | Freq: Every day | ORAL | Status: DC

## 2023-12-23 MED ORDER — ACETAMINOPHEN 650 MG RE SUPP: 650.0000 mg | Freq: Four times a day (QID) | RECTAL | Status: DC | PRN

## 2023-12-23 MED ORDER — ENOXAPARIN SODIUM 40 MG/0.4ML IJ SOSY
40.0000 mg | PREFILLED_SYRINGE | INTRAMUSCULAR | Status: DC
  Administered 2023-12-23 – 2023-12-24 (×2): 40 mg via SUBCUTANEOUS
  Filled 2023-12-23 (×2): qty 0.4

## 2023-12-23 MED ORDER — ALBUTEROL SULFATE HFA 108 (90 BASE) MCG/ACT IN AERS: 2.0000 | INHALATION_SPRAY | Freq: Four times a day (QID) | RESPIRATORY_TRACT | Status: DC | PRN

## 2023-12-23 MED ORDER — ORAL CARE MOUTH RINSE: 15.0000 mL | OROMUCOSAL | Status: DC | PRN

## 2023-12-23 MED ORDER — INSULIN ASPART 100 UNIT/ML IJ SOLN
0.0000 [IU] | Freq: Three times a day (TID) | INTRAMUSCULAR | Status: DC
  Administered 2023-12-24: 1 [IU] via SUBCUTANEOUS
  Administered 2023-12-24: 2 [IU] via SUBCUTANEOUS
  Administered 2023-12-24: 1 [IU] via SUBCUTANEOUS

## 2023-12-23 MED ORDER — HYDROCHLOROTHIAZIDE 12.5 MG PO TABS: 12.5000 mg | ORAL_TABLET | Freq: Every morning | ORAL | Status: DC

## 2023-12-23 NOTE — ED Triage Notes (Signed)
 Patient presents to the ER from her doctor's office with a low HR. Patient has no complaints in the ER at this time. Patient's HR in ER in the 30s with increased BP. Patient denies any heart issues.

## 2023-12-23 NOTE — ED Provider Triage Note (Signed)
 Emergency Medicine Provider Triage Evaluation Note  TAELAR GRONEWOLD , a 83 y.o. female  was evaluated in triage.  Pt complains of labored breathing for the past couple weeks.  She reports that this worsens when she exerts herself.  No chest pain.  No dizziness.  Reports that she was told she had a low heart rate by her doctor and was told to come to the ER.  Review of Systems  Positive: As above Negative: As above  Physical Exam  BP (!) 171/76 (BP Location: Right Arm)   Pulse (!) 38   Temp 98.8 F (37.1 C) (Oral)   Resp 18   SpO2 93%  Gen:   Awake, no distress   Resp:  Normal effort  MSK:   Moves extremities without difficulty  Other:  Bradycardic on cardiac auscultation  Medical Decision Making  Medically screening exam initiated at 12:46 PM.  Appropriate orders placed.  SHAREE STURDY was informed that the remainder of the evaluation will be completed by another provider, this initial triage assessment does not replace that evaluation, and the importance of remaining in the ED until their evaluation is complete.  Patient moved to formal room   Veta Palma, PA-C 12/23/23 1246

## 2023-12-23 NOTE — ED Provider Notes (Addendum)
 [place] EMERGENCY DEPARTMENT AT [facility] Provider Note   CSN: [id number] Arrival date & time: 12/23/23  1228     Patient presents with: Bradycardia   Angel Tanner is a 83 y.o. female.   HPI   83 year old female with medical history significant for dementia, DM2, HLD, OSA, HTN, COPD, dilation of the ascending aorta presenting to the emergency department with a chief complaint of low heart rate.  The patient denies any symptoms at this time.  Unclear if history of present illness is impacted by the patient's dementia.  She denies any lightheadedness, syncope or near syncope.  She denies any chest pain, shortness of breath, abdominal pain, nausea or vomiting.  No fevers or chills.  She states that her primary doctor sent her to the emergency department for low heart rate.  Prior to Admission medications   Medication Sig Start Date End Date Taking? Authorizing Provider  albuterol  (VENTOLIN  HFA) 108 (90 Base) MCG/ACT inhaler Inhale 2 puffs into the lungs every 6 (six) hours as needed for wheezing or shortness of breath. 02/18/23   Assaker, Darrin, MD  albuterol  (VENTOLIN  HFA) 108 (90 Base) MCG/ACT inhaler Inhale 2 puffs into the lungs every 6 (six) hours as needed for wheezing or shortness of breath.    [provider]  Albuterol  Sulfate (PROAIR  RESPICLICK) 108 (90 Base) MCG/ACT AEPB INHALE TWO PUFFS into lungs EVERY 4 HOURS AS NEEDED FOR WHEEZING, FOR SHORTNESS OF BREATH AND FOR tightness 09/28/21   Byrum, Lamar RAMAN, MD  aspirin  81 MG chewable tablet Chew 81 mg by mouth at bedtime.     [provider]  budesonide -formoterol  (SYMBICORT ) 160-4.5 MCG/ACT inhaler Inhale 2 puffs into the lungs in the morning and at bedtime. 02/18/23   Assaker, Darrin, MD  Calcium  Carb-Cholecalciferol (CALCIUM  + D3 PO) Take 1 tablet by mouth daily.    [provider]  Calcium  Carb-Cholecalciferol 600-12.5 MG-MCG CAPS Take 1 tablet by mouth 2 (two) times daily.     [provider]  canagliflozin  (INVOKANA ) 300 MG TABS tablet Take 300 mg by mouth daily before breakfast.    [provider]  cyanocobalamin  (VITAMIN B12) 1000 MCG tablet Take 1,000 mcg by mouth daily.    [provider]  donepezil  (ARICEPT ) 10 MG tablet Take 1 tablet daily 07/25/23   Wertman, Sara E, PA-C  fluticasone  furoate-vilanterol (BREO ELLIPTA ) 200-25 MCG/ACT AEPB Inhale 1 puff into the lungs daily. 05/01/23   Malka Darrin, MD  hydrochlorothiazide  (HYDRODIURIL ) 12.5 MG tablet Take 12.5 mg by mouth every morning. 11/27/21   [provider]  hydrocortisone (ANUSOL-HC) 25 MG suppository Place 25 mg rectally daily as needed for hemorrhoids. 03/16/20   [provider]  latanoprost (XALATAN) 0.005 % ophthalmic solution Place 1 drop into both eyes at bedtime. 03/22/18   [provider]  lisinopril  (ZESTRIL ) 10 MG tablet Take 1 tablet by mouth daily.    [provider]  meloxicam (MOBIC) 15 MG tablet Take 15 mg by mouth daily. 02/17/23   [provider]  metFORMIN  (GLUCOPHAGE -XR) 500 MG 24 hr tablet Take 3 tablets (1,500 mg total) by mouth daily with supper. 12/01/23   Thapa, [place], MD  ONETOUCH VERIO test strip USE TO CHECK BLOOD GLUCOSE ONCE DAILY 09/16/22   [place], MD  Polyethyl Glycol-Propyl Glycol 0.4-0.3 % SOLN Place 1 drop into both eyes 2 (two) times daily as needed (dry/irritated eyes.).    [provider]  polyethylene glycol powder (MIRALAX ) 17 GM/SCOOP powder Take 119  g by mouth daily as needed for mild constipation.    [provider]  rosuvastatin  (CRESTOR ) 10 MG tablet Take 10 mg by mouth at bedtime.     [provider]    Allergies: Iodine and Iodinated contrast media    Review of Systems  Unable to perform ROS: Dementia    Updated Vital Signs BP (!) 173/70   Pulse (!) 37   Temp 98.8 F (37.1 C) (Oral)   Resp 16   Ht 5' 1 (1.549 m)   Wt 76.3 kg   SpO2 93%   BMI  31.78 kg/m   Physical Exam Vitals and nursing note reviewed.  Constitutional:      General: She is not in acute distress. HENT:     Head: Normocephalic and atraumatic.  Eyes:     Conjunctiva/sclera: Conjunctivae normal.     Pupils: Pupils are equal, round, and reactive to light.  Cardiovascular:     Rate and Rhythm: Regular rhythm. Bradycardia present.     Pulses: Normal pulses.  Pulmonary:     Effort: Pulmonary effort is normal. No respiratory distress.  Abdominal:     General: There is no distension.     Tenderness: There is no guarding.  Musculoskeletal:        General: No deformity or signs of injury.     Cervical back: Neck supple.  Skin:    Findings: No lesion or rash.  Neurological:     General: No focal deficit present.     Mental Status: She is alert. Mental status is at baseline.     Cranial Nerves: No cranial nerve deficit.     Sensory: No sensory deficit.     Motor: No weakness.     Comments: GCS 14     (all labs ordered are listed, but only abnormal results are displayed) Labs Reviewed  CBC WITH DIFFERENTIAL/PLATELET - Abnormal; Notable for the following components:      Result Value   WBC 3.2 (*)    RBC 5.22 (*)    HCT 49.0 (*)    All other components within normal limits  I-STAT CHEM 8, ED - Abnormal; Notable for the following components:   Glucose, Bld 150 (*)    Hemoglobin 16.3 (*)    HCT 48.0 (*)    All other components within normal limits  TSH  TROPONIN I (HIGH SENSITIVITY)    EKG: EKG Interpretation Date/Time:  Tuesday December 23 2023 14:15:47 EDT Ventricular Rate:  36 PR Interval:  204 QRS Duration:  143 QT Interval:  645 QTC Calculation: 500 R Axis:   90  Text Interpretation: Sinus bradycardia RBBB and LPFB Abnormal T, consider ischemia, lateral leads Confirmed by Jerrol Agent ([id number]) on 12/23/2023 2:27:03 PM  Radiology: DG Chest Portable 1 View Result Date: 12/23/2023 EXAM: 1 VIEW(S) XRAY OF THE CHEST 12/23/2023 02:03:00 PM  COMPARISON: 10/04/2022 CLINICAL HISTORY: Bradycardia, hypertension. Triage notes: Pt reports she was told by her Drs office to come to the ER. Pt does not know why they told her to come. She has no complaints at this time. Per Drs office pt was sent due to bradycardia, hypoxia, and dementia. FINDINGS: LUNGS AND PLEURA: Stable bibasilar atelectasis or scarring. No focal pulmonary opacity. No pulmonary edema. No pleural effusion. No pneumothorax. HEART AND MEDIASTINUM: Cardiomegaly and ectasia of thoracic aorta. Aortic atherosclerotic calcification. BONES AND SOFT TISSUES: No acute osseous abnormality. IMPRESSION: 1. No acute findings. 2. Cardiomegaly and ectasia of the thoracic aorta. Electronically signed by:  Waddell Calk MD 12/23/2023 03:21 PM EDT RP Workstation: [id number]     Procedures   Medications Ordered in the ED - No data to display                                  Medical Decision Making Amount and/or Complexity of Data Reviewed Radiology: ordered.    83 year old female with medical history significant for dementia, DM2, HLD, OSA, HTN, COPD, dilation of the ascending aorta presenting to the emergency department with a chief complaint of low heart rate.  The patient denies any symptoms at this time.  Unclear if history of present illness is impacted by the patient's dementia.  She denies any lightheadedness, syncope or near syncope.  She denies any chest pain, shortness of breath, abdominal pain, nausea or vomiting.  No fevers or chills.  She states that her primary doctor sent her to the emergency department for low heart rate.  On arrival, the patient was afebrile, bradycardic heart rate 38, not tachypneic RR 18, mildly hypertensive BP 171/76, saturating 93 to 99% on room air.  Sinus bradycardia noted on cardiac telemetry and confirmed on EKG.  EKG revealed sinus bradycardia, ventricular rate 36, nonspecific T wave changes present, intermittent HB noted.  Chest x-ray: No acute  findings, cardiomegaly and ectasia of the thoracic aorta noted.  Labs: TSH normal, CBC without leukocytosis, no anemia, i-STAT Chem-8 without renal dysfunction, normal electrolytes.  Troponin pending.  Cardiology consulted, full recommendations from cardiology pending at time of signout.  Plan at time of signout to follow-up results of cardiac troponin and follow-up recommendations from cardiology.  Signout given to Dr. Emil at [phone].     Final diagnoses:  Bradycardia    ED Discharge Orders     None          Jerrol Agent, MD 12/23/23 1651    Jerrol Agent, MD 12/23/23 2033
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
## 2023-12-23 NOTE — ED Triage Notes (Addendum)
 Pt reports she was told by her Drs office to come to the ER. Pt does not know why they told her to come. She has no complaints at this time. Per Drs office pt was sent due to bradycardia, hypoxia, and dementia.

## 2023-12-23 NOTE — ED Provider Notes (Signed)
 Received patient in turnover from Dr. Jerrol.  Please see their note for further details of Hx, PE.  Briefly patient is a 83 y.o. female with a Bradycardia .  Not known if symptomatic or not due to patient's baseline confusion.  Awaiting cardiology evaluation.  Patient had been seen by cardiology.  Recommending admission.  Will discuss with medicine.Angel Tanner, Rolan, DO 12/23/23 2036
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
## 2023-12-24 ENCOUNTER — Other Ambulatory Visit: Payer: Self-pay

## 2023-12-24 ENCOUNTER — Inpatient Hospital Stay (HOSPITAL_COMMUNITY)

## 2023-12-24 ENCOUNTER — Inpatient Hospital Stay (HOSPITAL_COMMUNITY): Admission: EM | Disposition: A | Discharge status: Home Health | Payer: Self-pay | Source: Home / Self Care

## 2023-12-24 DIAGNOSIS — Z01810 Encounter for preprocedural cardiovascular examination: Secondary | ICD-10-CM | POA: Diagnosis not present

## 2023-12-24 DIAGNOSIS — I442 Atrioventricular block, complete: Principal | ICD-10-CM

## 2023-12-24 DIAGNOSIS — R9431 Abnormal electrocardiogram [ECG] [EKG]: Secondary | ICD-10-CM | POA: Diagnosis not present

## 2023-12-24 HISTORY — PX: PACEMAKER IMPLANT: EP1218

## 2023-12-24 LAB — CBC
HCT: 46.3 % — ABNORMAL HIGH (ref 36.0–46.0)
HCT: 46.2 % — ABNORMAL HIGH (ref 36.0–46.0)
Hemoglobin: 14.3 g/dL (ref 12.0–15.0)
Hemoglobin: 13.7 g/dL (ref 12.0–15.0)
MCH: 28.8 pg (ref 26.0–34.0)
MCH: 28.6 pg (ref 26.0–34.0)
MCHC: 30.9 g/dL (ref 30.0–36.0)
MCHC: 29.7 g/dL — ABNORMAL LOW (ref 30.0–36.0)
MCV: 93.3 fL (ref 80.0–100.0)
MCV: 96.5 fL (ref 80.0–100.0)
Platelets: 188 K/uL (ref 150–400)
Platelets: 175 K/uL (ref 150–400)
RBC: 4.96 MIL/uL (ref 3.87–5.11)
RBC: 4.79 MIL/uL (ref 3.87–5.11)
RDW: 13.1 % (ref 11.5–15.5)
RDW: 13.2 % (ref 11.5–15.5)
WBC: 4.1 K/uL (ref 4.0–10.5)
WBC: 4.4 K/uL (ref 4.0–10.5)
nRBC: 0 % (ref 0.0–0.2)
nRBC: 0 % (ref 0.0–0.2)

## 2023-12-24 LAB — ECHOCARDIOGRAM COMPLETE
AR max vel: 2.71 cm2
AV Area VTI: 2.51 cm2
AV Area mean vel: 2.58 cm2
AV Mean grad: 6 mmHg
AV Peak grad: 11.8 mmHg
Ao pk vel: 1.72 m/s
Area-P 1/2: 3.77 cm2
Calc EF: 62.3 %
Height: 61 in
MV VTI: 1.51 cm2
S' Lateral: 3 cm
Single Plane A2C EF: 62.5 %
Single Plane A4C EF: 65.4 %
Weight: 2646.4 [oz_av]

## 2023-12-24 LAB — POTASSIUM: Potassium: 3.8 mmol/L (ref 3.5–5.1)

## 2023-12-24 LAB — CREATININE, SERUM
Creatinine, Ser: 0.76 mg/dL (ref 0.44–1.00)
GFR, Estimated: >60 mL/min (ref >60)

## 2023-12-24 LAB — GLUCOSE, CAPILLARY
Glucose-Capillary: 142 mg/dL — ABNORMAL HIGH (ref 70–99)
Glucose-Capillary: 126 mg/dL — ABNORMAL HIGH (ref 70–99)
Glucose-Capillary: 139 mg/dL — ABNORMAL HIGH (ref 70–99)
Glucose-Capillary: 589 mg/dL (ref 70–99)
Glucose-Capillary: 187 mg/dL — ABNORMAL HIGH (ref 70–99)

## 2023-12-24 LAB — BASIC METABOLIC PANEL WITH GFR
Anion gap: 14 (ref 5–15)
BUN: 12 mg/dL (ref 8–23)
CO2: 25 mmol/L (ref 22–32)
Calcium: 9.5 mg/dL (ref 8.9–10.3)
Chloride: 98 mmol/L (ref 98–111)
Creatinine, Ser: 0.85 mg/dL (ref 0.44–1.00)
GFR, Estimated: >60 mL/min (ref >60)
Glucose, Bld: 170 mg/dL — ABNORMAL HIGH (ref 70–99)
Potassium: 4.1 mmol/L (ref 3.5–5.1)
Sodium: 137 mmol/L (ref 135–145)

## 2023-12-24 LAB — COMPREHENSIVE METABOLIC PANEL WITH GFR
ALT: 12 U/L (ref 0–44)
AST: 18 U/L (ref 15–41)
Albumin: 3.3 g/dL — ABNORMAL LOW (ref 3.5–5.0)
Alkaline Phosphatase: 40 U/L (ref 38–126)
Anion gap: 13 (ref 5–15)
BUN: 12 mg/dL (ref 8–23)
CO2: 19 mmol/L — ABNORMAL LOW (ref 22–32)
Calcium: 6.7 mg/dL — ABNORMAL LOW (ref 8.9–10.3)
Chloride: 107 mmol/L (ref 98–111)
Creatinine, Ser: 0.84 mg/dL (ref 0.44–1.00)
GFR, Estimated: >60 mL/min (ref >60)
Glucose, Bld: 169 mg/dL — ABNORMAL HIGH (ref 70–99)
Potassium: 5.6 mmol/L — ABNORMAL HIGH (ref 3.5–5.1)
Sodium: 139 mmol/L (ref 135–145)
Total Bilirubin: 0.5 mg/dL (ref 0.0–1.2)
Total Protein: 6.6 g/dL (ref 6.5–8.1)

## 2023-12-24 LAB — MAGNESIUM
Magnesium: 1.4 mg/dL — ABNORMAL LOW (ref 1.7–2.4)
Magnesium: 2.3 mg/dL (ref 1.7–2.4)

## 2023-12-24 LAB — TROPONIN I (HIGH SENSITIVITY): Troponin I (High Sensitivity): 21 ng/L — ABNORMAL HIGH (ref <18)

## 2023-12-24 LAB — SURGICAL PCR SCREEN
MRSA, PCR: NEGATIVE
Staphylococcus aureus: NEGATIVE

## 2023-12-24 LAB — PHOSPHORUS: Phosphorus: 4.1 mg/dL (ref 2.5–4.6)

## 2023-12-24 SURGERY — PACEMAKER IMPLANT

## 2023-12-24 MED ORDER — FENTANYL CITRATE (PF) 100 MCG/2ML IJ SOLN
INTRAMUSCULAR | Status: AC
  Filled 2023-12-24: qty 2

## 2023-12-24 MED ORDER — ONDANSETRON HCL 4 MG/2ML IJ SOLN
4.0000 mg | Freq: Four times a day (QID) | INTRAMUSCULAR | Status: DC | PRN
  Administered 2023-12-25: 4 mg via INTRAVENOUS
  Filled 2023-12-24: qty 2

## 2023-12-24 MED ORDER — HYDROCHLOROTHIAZIDE 25 MG PO TABS
25.0000 mg | ORAL_TABLET | Freq: Every morning | ORAL | Status: DC
  Administered 2023-12-24 – 2023-12-26 (×3): 25 mg via ORAL
  Filled 2023-12-24 (×3): qty 1

## 2023-12-24 MED ORDER — LIDOCAINE HCL (PF) 1 % IJ SOLN
INTRAMUSCULAR | Status: DC | PRN
  Administered 2023-12-24: 30 mL

## 2023-12-24 MED ORDER — SODIUM CHLORIDE 0.9% FLUSH: 3.0000 mL | INTRAVENOUS | Status: DC | PRN

## 2023-12-24 MED ORDER — SODIUM CHLORIDE 0.9 % IV SOLN
INTRAVENOUS | Status: AC
  Administered 2023-12-24: 80 mg
  Filled 2023-12-24: qty 2

## 2023-12-24 MED ORDER — CHLORHEXIDINE GLUCONATE 4 % EX SOLN
60.0000 mL | Freq: Once | CUTANEOUS | Status: AC
  Administered 2023-12-24: 4 via TOPICAL
  Filled 2023-12-24: qty 15

## 2023-12-24 MED ORDER — HEPARIN (PORCINE) IN NACL 1000-0.9 UT/500ML-% IV SOLN
INTRAVENOUS | Status: DC | PRN
  Administered 2023-12-24: 500 mL

## 2023-12-24 MED ORDER — SODIUM ZIRCONIUM CYCLOSILICATE 5 G PO PACK
5.0000 g | PACK | Freq: Once | ORAL | Status: AC
  Administered 2023-12-24: 5 g via ORAL
  Filled 2023-12-24: qty 1

## 2023-12-24 MED ORDER — MAGNESIUM SULFATE IN D5W 1-5 GM/100ML-% IV SOLN
1.0000 g | Freq: Once | INTRAVENOUS | Status: AC
  Administered 2023-12-24: 1 g via INTRAVENOUS
  Filled 2023-12-24: qty 100

## 2023-12-24 MED ORDER — MIDAZOLAM HCL 5 MG/5ML IJ SOLN
INTRAMUSCULAR | Status: DC | PRN
  Administered 2023-12-24 (×2): .5 mg via INTRAVENOUS

## 2023-12-24 MED ORDER — FENTANYL CITRATE (PF) 100 MCG/2ML IJ SOLN
INTRAMUSCULAR | Status: DC | PRN
  Administered 2023-12-24 (×2): 25 ug via INTRAVENOUS

## 2023-12-24 MED ORDER — LIDOCAINE HCL (PF) 1 % IJ SOLN
INTRAMUSCULAR | Status: AC
  Filled 2023-12-24: qty 60

## 2023-12-24 MED ORDER — SODIUM CHLORIDE 0.9 % IV SOLN: INTRAVENOUS | Status: DC

## 2023-12-24 MED ORDER — CEFAZOLIN SODIUM-DEXTROSE 1-4 GM/50ML-% IV SOLN
1.0000 g | Freq: Four times a day (QID) | INTRAVENOUS | Status: AC
  Administered 2023-12-25 (×3): 1 g via INTRAVENOUS
  Filled 2023-12-24 (×3): qty 50

## 2023-12-24 MED ORDER — ONDANSETRON HCL 4 MG/2ML IJ SOLN
INTRAMUSCULAR | Status: DC | PRN
  Administered 2023-12-24: 4 mg via INTRAVENOUS

## 2023-12-24 MED ORDER — CALCIUM GLUCONATE-NACL 2-0.675 GM/100ML-% IV SOLN
2.0000 g | Freq: Once | INTRAVENOUS | Status: AC
  Administered 2023-12-24: 2000 mg via INTRAVENOUS
  Filled 2023-12-24: qty 100

## 2023-12-24 MED ORDER — SODIUM CHLORIDE 0.9% FLUSH
3.0000 mL | Freq: Two times a day (BID) | INTRAVENOUS | Status: DC
  Administered 2023-12-24 – 2023-12-26 (×5): 3 mL via INTRAVENOUS

## 2023-12-24 MED ORDER — ONDANSETRON HCL 4 MG/2ML IJ SOLN
INTRAMUSCULAR | Status: AC
  Filled 2023-12-24: qty 2

## 2023-12-24 MED ORDER — METHYLPREDNISOLONE SODIUM SUCC 40 MG IJ SOLR
40.0000 mg | INTRAMUSCULAR | Status: DC
  Administered 2023-12-24 (×2): 40 mg via INTRAVENOUS
  Filled 2023-12-24 (×2): qty 1

## 2023-12-24 MED ORDER — MAGNESIUM SULFATE 2 GM/50ML IV SOLN
2.0000 g | Freq: Once | INTRAVENOUS | Status: AC
  Administered 2023-12-24: 2 g via INTRAVENOUS
  Filled 2023-12-24: qty 50

## 2023-12-24 MED ORDER — MUPIROCIN 2 % EX OINT
TOPICAL_OINTMENT | Freq: Two times a day (BID) | CUTANEOUS | Status: DC
  Filled 2023-12-24 (×3): qty 22

## 2023-12-24 MED ORDER — MIDAZOLAM HCL 2 MG/2ML IJ SOLN
INTRAMUSCULAR | Status: AC
  Filled 2023-12-24: qty 2

## 2023-12-24 MED ORDER — CEFAZOLIN SODIUM-DEXTROSE 2-4 GM/100ML-% IV SOLN
2.0000 g | INTRAVENOUS | Status: AC
  Administered 2023-12-24: 2 g via INTRAVENOUS
  Filled 2023-12-24: qty 100

## 2023-12-24 MED ORDER — PERFLUTREN LIPID MICROSPHERE
1.0000 mL | INTRAVENOUS | Status: AC | PRN
  Administered 2023-12-24: 2 mL via INTRAVENOUS

## 2023-12-24 MED ORDER — SODIUM CHLORIDE 0.9 % IV SOLN
80.0000 mg | INTRAVENOUS | Status: AC
  Filled 2023-12-24: qty 2

## 2023-12-24 MED ORDER — HYDRALAZINE HCL 25 MG PO TABS
25.0000 mg | ORAL_TABLET | Freq: Three times a day (TID) | ORAL | Status: DC
  Administered 2023-12-24 – 2023-12-25 (×4): 25 mg via ORAL
  Filled 2023-12-24 (×4): qty 1

## 2023-12-24 MED ORDER — CEFAZOLIN SODIUM-DEXTROSE 2-4 GM/100ML-% IV SOLN
INTRAVENOUS | Status: AC
  Filled 2023-12-24: qty 100

## 2023-12-24 SURGICAL SUPPLY — 13 items
CABLE SURGICAL S-101-97-12 (CABLE) ×2 IMPLANT
CATH RIGHTSITE C315HIS02 (CATHETERS) IMPLANT
IPG PACE AZUR XT DR MRI W1DR01 (Pacemaker) IMPLANT
KIT INSTRUMENT PACEMAKER INSER (INSTRUMENTS) IMPLANT
LEAD CAPSURE NOVUS 5076-52CM (Lead) IMPLANT
LEAD SELECT SECURE 3830 383069 (Lead) IMPLANT
PAD DEFIB RADIO PHYSIO CONN (PAD) ×2 IMPLANT
SHEATH 7FR PRELUDE SNAP 13 (SHEATH) IMPLANT
SHEATH 7FR PRELUDE SNAP 25 (SHEATH) IMPLANT
SHEATH PROBE COVER 6X72 (BAG) IMPLANT
SLITTER 6232ADJ (MISCELLANEOUS) IMPLANT
TRAY PACEMAKER INSERTION (PACKS) ×2 IMPLANT
WIRE HI TORQ VERSACORE-J 145CM (WIRE) IMPLANT

## 2023-12-24 NOTE — TOC Initial Note (Signed)
 Transition of Care [facility]) - Initial/Assessment Note    Patient Details  Name: Angel Tanner MRN: [id number] Date of Birth: 01/20/1941  Transition of Care [facility]) CM/SW Contact:    Sudie Erminio Deems, RN Phone Number: 12/24/2023, 2:54 PM  Clinical Narrative: Patient presented for bradycardia. EP is consulting and discussing PPM. PTA patient was from home with sister. Sister was at the bedside during the visit. Patient states she does not use any DME in the home. Patient has PCP and has no issues with transportation. ICM will continue to follow for additional needs as the patient progresses.                   Expected Discharge Plan: Home w Home Health Services Barriers to Discharge: Continued Medical Work up   Patient Goals and CMS Choice Patient states their goals for this hospitalization and ongoing recovery are:: Plan to return home once stable.          Expected Discharge Plan and Services   Discharge Planning Services: CM Consult Post Acute Care Choice: Home Health Living arrangements for the past 2 months: Single Family Home                   DME Agency: NA                  Prior Living Arrangements/Services Living arrangements for the past 2 months: Single Family Home Lives with:: Siblings Patient language and need for interpreter reviewed:: Yes Do you feel safe going back to the place where you live?: Yes      Need for Family Participation in Patient Care: No (Comment) Care giver support system in place?: No (comment)   Criminal Activity/Legal Involvement Pertinent to Current Situation/Hospitalization: No - Comment as needed  Activities of Daily Living   ADL Screening (condition at time of admission) Independently performs ADLs?: Yes (appropriate for developmental age) Is the patient deaf or have difficulty hearing?: No Does the patient have difficulty seeing, even when wearing glasses/contacts?: No Does the patient have difficulty  concentrating, remembering, or making decisions?: Yes  Permission Sought/Granted Permission sought to share information with : Family Supports, Case Manager                Emotional Assessment Appearance:: Appears stated age Attitude/Demeanor/Rapport: Engaged Affect (typically observed): Appropriate Orientation: : Oriented to Self, Oriented to Place Alcohol / Substance Use: Not Applicable Psych Involvement: No (comment)  Admission diagnosis:  Bradycardia [R00.1] Patient Active Problem List   Diagnosis Date Noted   Bradycardia 12/23/2023   Mild cognitive impairment, concerns for Alzheimer's disease 04/28/2023   Meningioma 02/04/2023   Pancreatitis 10/05/2022   Mallet toe of left foot 04/29/2019   Arthralgia of left temporomandibular joint 06/25/2018   Referred otalgia of left ear 06/25/2018   Ascending aorta dilation 01/27/2018   Centrilobular emphysema 01/27/2018   Atherosclerosis of aorta 01/27/2018   COPD (chronic obstructive pulmonary disease) 09/18/2015   Pulmonary nodules 11/26/2013   Obstructive sleep apnea 02/03/2009   Hypersomnia, unspecified 12/09/2008   Type II diabetes mellitus 11/27/2006   Hypercholesterolemia 11/27/2006   Unspecified glaucoma 11/27/2006   Benign essential hypertension 11/27/2006   Temporal arteritis 11/27/2006   PCP:  Rexanne Ingle, MD Pharmacy:   [facility] [id number], [place] - [address] [place] [address] Phone: [phone] Fax: [phone]     Social Drivers of Health (SDOH) Social History: SDOH Screenings   Food Insecurity: No Food Insecurity (  12/24/2023)  Housing: Low Risk  (12/24/2023)  Transportation Needs: No Transportation Needs (12/24/2023)  Utilities: Not At Risk (12/24/2023)  Depression (PHQ2-9): Low Risk  (07/16/2018)  Social Connections: Moderately Integrated (12/24/2023)  Tobacco Use: Medium Risk (12/23/2023)   SDOH Interventions:     Readmission Risk Interventions     No  data to display

## 2023-12-24 NOTE — Progress Notes (Signed)
  Echocardiogram 2D Echocardiogram has been performed.  Angel Tanner 12/24/2023, 11:57 AM
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
## 2023-12-24 NOTE — Interval H&P Note (Signed)
 History and Physical Interval Note:  12/24/2023 3:58 PM  Angel Tanner  has presented today for surgery, with the diagnosis of complete heart block and symptomatic bradycardia.  The various methods of treatment have been discussed with the patient and family - son, [name] (MPOA). After consideration of risks, benefits and other options for treatment, the patient has consented to  Procedure(s): PACEMAKER IMPLANT (N/A) as a surgical intervention.  The patient's history has been reviewed, patient examined, no change in status, stable for surgery.  I have reviewed the patient's chart and labs.  Questions were answered to the patient's satisfaction.     Fonda Kitty
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
## 2023-12-24 NOTE — Progress Notes (Signed)
 Progress Note   Patient: Angel Tanner FMW:[id number] DOB: 03/14/1940 DOA: 12/23/2023     1 DOS: the patient was seen and examined on 12/24/2023   Assessment and Plan:  #Bradycardia, 2:1 HB - Patient bradycardic to 30s on initial presentation without identifiable electrolyte abnormalities or pharmacologic agents (not taking donepezil  despite prescription), TSH wnl.  Was instructed to present to ED by her doctors office - Remains asymptomatic, hypertensive - EKG showed sinus bradycardia with 2:1 block, RBBB and LFPB - Cardiology consulted in ED - recommended echo and EP evaluation  - This morning noted to be hyperkalemia and hypomagnesemic, will address as below - EP to place PPM today  #Hypertension - BP uncontrolled this morning, SBP in 200s, possibly partially in response to bradycardia - Lisinopril  held given hyperkalemia - Increase hydrochlorothiazide  to 25 mg daily - Started p.o. hydralazine 25 mg 3 times daily  #Hyperkalemia - K 5.6 today, no clear etiology - Held home lisinopril  - Ordered calcium  gluconate 2 g and Lokelma 5 g once - Repeat BMP in the afternoon  #Hypomagnesemia - Mag 1.4 - Repleted with mag sulfate 2 g  #Hypocalcemia - Corrected Ca 7.3 - Calcium  gluconate 2 g ordered  #T2DM - Hgb A1c 7.7 - SSI  #Hyperlipidemia - Continue Crestor  10  #CAD - Noted to have a normal perfusion study in 2024 - Continue aspirin  and Crestor   #Dementia - Per family, patient has history of sundowning.  Family planning on being present during hospitalization to help mitigate this.  #COPD - Not on any inhalers at home - PRN albuterol  nebs - Stable     Subjective: Patient seen at bedside today. Denies any chest pain, shortness of breath, palpitations, dizziness, or syncope.  Physical Exam: Vitals:   12/23/23 2336 12/24/23 0356 12/24/23 0742 12/24/23 0800  BP: (!) 154/99 (!) 211/71 (!) 216/70 (!) 219/90  Pulse: (!) 38 (!) 56 (!) 56   Resp: 18 16 20  (!) 23   Temp: 98.5 F (36.9 C) 98.2 F (36.8 C) 98 F (36.7 C)   TempSrc: Oral Oral Oral   SpO2: 94%  99%   Weight: 75 kg     Height: 5' 1 (1.549 m)      Physical Exam Constitutional:      General: She is not in acute distress.    Appearance: She is not ill-appearing.  Cardiovascular:     Rate and Rhythm: Regular rhythm. Bradycardia present.     Heart sounds: Normal heart sounds. No murmur heard. Pulmonary:     Effort: Pulmonary effort is normal. No respiratory distress.     Breath sounds: Normal breath sounds.  Abdominal:     General: Bowel sounds are normal.     Palpations: Abdomen is soft.     Tenderness: There is no abdominal tenderness.  Musculoskeletal:     Right lower leg: No edema.     Left lower leg: No edema.  Skin:    General: Skin is warm and dry.     Capillary Refill: Capillary refill takes less than 2 seconds.  Neurological:     Mental Status: She is alert and oriented to person, place, and time. Mental status is at baseline.     Family Communication: Updated patient's sister at bedside  Disposition: Status is: Inpatient Remains inpatient appropriate because: undergoing placement of PPM  Planned Discharge Destination: Home with Home Health    Time spent: 40 minutes  Author: Duffy Larch, MD 12/24/2023 8:48 AM  For on call review [email or web address].

## 2023-12-24 NOTE — Plan of Care (Signed)
  Problem: Education: Goal: Ability to describe self-care measures that may prevent or decrease complications (Diabetes Survival Skills Education) will improve Outcome: Progressing Goal: Individualized Educational Video(s) Outcome: Progressing   Problem: Coping: Goal: Ability to adjust to condition or change in health will improve Outcome: Progressing   Problem: Fluid Volume: Goal: Ability to maintain a balanced intake and output will improve Outcome: Progressing   Problem: Health Behavior/Discharge Planning: Goal: Ability to identify and utilize available resources and services will improve Outcome: Progressing Goal: Ability to manage health-related needs will improve Outcome: Progressing   Problem: Metabolic: Goal: Ability to maintain appropriate glucose levels will improve Outcome: Progressing   Problem: Nutritional: Goal: Maintenance of adequate nutrition will improve Outcome: Progressing Goal: Progress toward achieving an optimal weight will improve Outcome: Progressing   Problem: Skin Integrity: Goal: Risk for impaired skin integrity will decrease Outcome: Progressing   Problem: Tissue Perfusion: Goal: Adequacy of tissue perfusion will improve Outcome: Progressing   Problem: Education: Goal: Knowledge of General Education information will improve Description: Including pain rating scale, medication(s)/side effects and non-pharmacologic comfort measures Outcome: Progressing   Problem: Health Behavior/Discharge Planning: Goal: Ability to manage health-related needs will improve Outcome: Progressing   Problem: Clinical Measurements: Goal: Ability to maintain clinical measurements within normal limits will improve Outcome: Progressing Goal: Will remain free from infection Outcome: Progressing Goal: Diagnostic test results will improve Outcome: Progressing Goal: Respiratory complications will improve Outcome: Progressing Goal: Cardiovascular complication will  be avoided Outcome: Progressing   Problem: Activity: Goal: Risk for activity intolerance will decrease Outcome: Progressing   Problem: Nutrition: Goal: Adequate nutrition will be maintained Outcome: Progressing   Problem: Coping: Goal: Level of anxiety will decrease Outcome: Progressing   Problem: Elimination: Goal: Will not experience complications related to bowel motility Outcome: Progressing Goal: Will not experience complications related to urinary retention Outcome: Progressing   Problem: Pain Managment: Goal: General experience of comfort will improve and/or be controlled Outcome: Progressing   Problem: Safety: Goal: Ability to remain free from injury will improve Outcome: Progressing   Problem: Skin Integrity: Goal: Risk for impaired skin integrity will decrease Outcome: Progressing   Problem: Education: Goal: Knowledge of cardiac device and self-care will improve Outcome: Progressing Goal: Ability to safely manage health related needs after discharge will improve Outcome: Progressing Goal: Individualized Educational Video(s) Outcome: Progressing   Problem: Cardiac: Goal: Ability to achieve and maintain adequate cardiopulmonary perfusion will improve Outcome: Progressing

## 2023-12-25 ENCOUNTER — Inpatient Hospital Stay (HOSPITAL_COMMUNITY)

## 2023-12-25 ENCOUNTER — Encounter (HOSPITAL_COMMUNITY): Payer: Self-pay | Admitting: Cardiology

## 2023-12-25 DIAGNOSIS — R001 Bradycardia, unspecified: Secondary | ICD-10-CM | POA: Diagnosis not present

## 2023-12-25 DIAGNOSIS — Z950 Presence of cardiac pacemaker: Secondary | ICD-10-CM | POA: Diagnosis not present

## 2023-12-25 DIAGNOSIS — I700 Atherosclerosis of aorta: Secondary | ICD-10-CM | POA: Diagnosis not present

## 2023-12-25 LAB — CBC
HCT: 48 % — ABNORMAL HIGH (ref 36.0–46.0)
Hemoglobin: 15.1 g/dL — ABNORMAL HIGH (ref 12.0–15.0)
MCH: 29 pg (ref 26.0–34.0)
MCHC: 31.5 g/dL (ref 30.0–36.0)
MCV: 92.1 fL (ref 80.0–100.0)
Platelets: 198 K/uL (ref 150–400)
RBC: 5.21 MIL/uL — ABNORMAL HIGH (ref 3.87–5.11)
RDW: 13.1 % (ref 11.5–15.5)
WBC: 7.8 K/uL (ref 4.0–10.5)
nRBC: 0 % (ref 0.0–0.2)

## 2023-12-25 LAB — GLUCOSE, CAPILLARY
Glucose-Capillary: 198 mg/dL — ABNORMAL HIGH (ref 70–99)
Glucose-Capillary: 158 mg/dL — ABNORMAL HIGH (ref 70–99)
Glucose-Capillary: 205 mg/dL — ABNORMAL HIGH (ref 70–99)
Glucose-Capillary: 185 mg/dL — ABNORMAL HIGH (ref 70–99)

## 2023-12-25 LAB — BASIC METABOLIC PANEL WITH GFR
Anion gap: 16 — ABNORMAL HIGH (ref 5–15)
BUN: 21 mg/dL (ref 8–23)
CO2: 20 mmol/L — ABNORMAL LOW (ref 22–32)
Calcium: 8.9 mg/dL (ref 8.9–10.3)
Chloride: 98 mmol/L (ref 98–111)
Creatinine, Ser: 1.23 mg/dL — ABNORMAL HIGH (ref 0.44–1.00)
GFR, Estimated: 44 mL/min — ABNORMAL LOW (ref >60)
Glucose, Bld: 205 mg/dL — ABNORMAL HIGH (ref 70–99)
Potassium: 4.5 mmol/L (ref 3.5–5.1)
Sodium: 134 mmol/L — ABNORMAL LOW (ref 135–145)

## 2023-12-25 LAB — LACTIC ACID, PLASMA
Lactic Acid, Venous: 2.8 mmol/L (ref 0.5–1.9)
Lactic Acid, Venous: 1.2 mmol/L (ref 0.5–1.9)

## 2023-12-25 LAB — BETA-HYDROXYBUTYRIC ACID: Beta-Hydroxybutyric Acid: 0.06 mmol/L (ref 0.05–0.27)

## 2023-12-25 MED ORDER — OFF THE BEAT BOOK
Freq: Once | Status: AC
  Filled 2023-12-25: qty 1

## 2023-12-25 MED ORDER — INSULIN ASPART 100 UNIT/ML IJ SOLN
0.0000 [IU] | Freq: Three times a day (TID) | INTRAMUSCULAR | Status: DC
  Administered 2023-12-25: 2 [IU] via SUBCUTANEOUS
  Administered 2023-12-25: 3 [IU] via SUBCUTANEOUS
  Administered 2023-12-25: 2 [IU] via SUBCUTANEOUS
  Administered 2023-12-26: 3 [IU] via SUBCUTANEOUS
  Administered 2023-12-26 (×2): 1 [IU] via SUBCUTANEOUS

## 2023-12-25 MED ORDER — INSULIN ASPART 100 UNIT/ML IJ SOLN: 0.0000 [IU] | Freq: Every day | INTRAMUSCULAR | Status: DC

## 2023-12-25 MED ORDER — METOPROLOL SUCCINATE ER 25 MG PO TB24
25.0000 mg | ORAL_TABLET | Freq: Every day | ORAL | Status: DC
  Administered 2023-12-25 – 2023-12-26 (×2): 25 mg via ORAL
  Filled 2023-12-25 (×3): qty 1

## 2023-12-25 MED ORDER — LACTATED RINGERS IV SOLN: INTRAVENOUS | Status: DC

## 2023-12-25 MED FILL — Midazolam HCl Inj 2 MG/2ML (Base Equivalent): INTRAMUSCULAR | Qty: 1 | Status: AC

## 2023-12-25 NOTE — Progress Notes (Addendum)
 Rounding Note   Patient Name: Angel Tanner Date of Encounter: 12/25/2023  [facility] Cardiologist: Evalene Lunger, MD   Subjective  No CP, SOB, mild aching at implant site  Scheduled Meds:  aspirin   81 mg Oral QHS   hydrALAZINE  25 mg Oral Q8H   hydrochlorothiazide   25 mg Oral q morning   insulin  aspart  0-5 Units Subcutaneous QHS   insulin  aspart  0-9 Units Subcutaneous TID WC   mupirocin ointment   Nasal BID   rosuvastatin   10 mg Oral QHS   sodium chloride  flush  3 mL Intravenous Q12H   Continuous Infusions:   ceFAZolin (ANCEF) IV 1 g (12/25/23 0552)   PRN Meds: acetaminophen  **OR** acetaminophen , albuterol , ondansetron  (ZOFRAN ) IV, mouth rinse   Vital Signs  Vitals:   12/24/23 2106 12/24/23 2350 12/25/23 0403 12/25/23 0751  BP: (!) 137/99 137/79 (!) 153/81 (!) 140/75  Pulse: 95 (!) 107 99 100  Resp: 16  20 20   Temp: 97.9 F (36.6 C) 98.9 F (37.2 C) 98.6 F (37 C) 98.7 F (37.1 C)  TempSrc: Oral Oral Oral Oral  SpO2: 100% 96% 99% 96%  Weight:      Height:        Intake/Output Summary (Last 24 hours) at 12/25/2023 0952 Last data filed at 12/25/2023 0600 Gross per 24 hour  Intake 177.59 ml  Output 100 ml  Net 77.59 ml      12/23/2023   11:36 PM 12/23/2023    1:08 PM 12/01/2023    1:10 PM  Last 3 Weights  Weight (lbs) 165 lb 6.4 oz 168 lb 3.4 oz 168 lb 3.2 oz  Weight (kg) 75.025 kg 76.3 kg 76.295 kg      Telemetry   SR 60's-70's w/ some ST (likely an Atach) low 100's, V paced - Personally Reviewed  ECG   ST 108bpm, V paced  - Personally Reviewed  Physical Exam  GEN: No acute distress.   Neck: No JVD Cardiac: RRR, no murmurs, rubs, or gallops.  Respiratory: Clear to auscultation bilaterally. GI: Soft, nontender, non-distended  MS: No edema; No deformity. Neuro:  Nonfocal  Psych: Normal affect   PPM site: stable, no bleedig, drainage or hematoma  Labs High Sensitivity Troponin:   Recent Labs  Lab 12/24/23 0100   TROPONINIHS 21*     Chemistry Recent Labs  Lab 12/23/23 1318 12/24/23 0100 12/24/23 0454 12/24/23 1043 12/24/23 2043  NA 141  --  139  --  137  K 4.0  --  5.6* 3.8 4.1  CL 100  --  107  --  98  CO2  --   --  19*  --  25  GLUCOSE 150*  --  169*  --  170*  BUN 11  --  12  --  12  CREATININE 0.70 0.76 0.84  --  0.85  CALCIUM   --   --  6.7*  --  9.5  MG  --   --  1.4*  --  2.3  PROT  --   --  6.6  --   --   ALBUMIN  --   --  3.3*  --   --   AST  --   --  18  --   --   ALT  --   --  12  --   --   ALKPHOS  --   --  40  --   --   BILITOT  --   --  0.5  --   --   GFRNONAA  --  >60 >60  --  >60  ANIONGAP  --   --  13  --  14    Lipids No results for input(s): CHOL, TRIG, HDL, LABVLDL, LDLCALC, CHOLHDL in the last 168 hours.  Hematology Recent Labs  Lab 12/24/23 0100 12/24/23 0454 12/25/23 0716  WBC 4.1 4.4 7.8  RBC 4.96 4.79 5.21*  HGB 14.3 13.7 15.1*  HCT 46.3* 46.2* 48.0*  MCV 93.3 96.5 92.1  MCH 28.8 28.6 29.0  MCHC 30.9 29.7* 31.5  RDW 13.1 13.2 13.1  PLT 188 175 198   Thyroid   Recent Labs  Lab 12/23/23 1245  TSH 1.426    BNPNo results for input(s): BNP, PROBNP in the last 168 hours.  DDimer No results for input(s): DDIMER in the last 168 hours.   Radiology  DG Chest 2 View Result Date: 12/25/2023 EXAM: 2 VIEW(S) XRAY OF THE CHEST 12/25/2023 07:01:00 AM COMPARISON: 12/23/2023 CLINICAL HISTORY: Cardiac device in situ, other [phone]. Post pacemaker.hx bradycardia FINDINGS: LINES, TUBES AND DEVICES: New left subclavian dual lead pacemaker in place. Leads are in the expected location of the right atrial appendage and right ventricle. LUNGS AND PLEURA: No focal pulmonary opacity. No pulmonary edema. No pleural effusion. No pneumothorax. HEART AND MEDIASTINUM: Aortic atherosclerosis. No acute abnormality of the cardiac and mediastinal silhouettes. BONES AND SOFT TISSUES: No acute osseous abnormality. IMPRESSION: 1. New left subclavian dual-lead  pacemaker in place. No pneumothorax identified after pacer placement. Electronically signed by: Waddell Calk MD 12/25/2023 07:19 AM EDT RP Workstation: [id number]    Cardiac Studies  12/24/23: TTE 1. Left ventricular ejection fraction, by estimation, is 60 to 65%. The  left ventricle has normal function. The left ventricle has no regional  wall motion abnormalities. The left ventricular internal cavity size was  moderately to severely dilated. There   is mild left ventricular hypertrophy. Left ventricular diastolic  parameters were normal.   2. Right ventricular systolic function is normal. The right ventricular  size is normal. Tricuspid regurgitation signal is inadequate for assessing  PA pressure.   3. The mitral valve is grossly normal. Trivial mitral valve  regurgitation. No evidence of mitral stenosis. Moderate mitral annular  calcification.   4. The aortic valve was not well visualized. There is moderate  calcification of the aortic valve. Aortic valve regurgitation is trivial.  Aortic valve sclerosis is present, with no evidence of aortic valve  stenosis.   5. The inferior vena cava is normal in size with greater than 50%  respiratory variability, suggesting right atrial pressure of 3 mmHg.   Comparison(s): No significant change from prior study.    01/14/23: stress myoview  Low risk, probably normal pharmacologic myocardial perfusion stress test.   There is a small in size, moderate in severity, fixed apical defect most likely representing artifact but cannot exclude infarct.   No significant ischemia is identified.   Left ventricular systolic function is normal (LVEF 55-65%).   Coronary artery calcification and aortic atherosclerosis are noted on the attenuation correction CT.   Collapse of the right middle lobe again noted, better evaluated on CTA chest, abdomen, and pelvis from 10/05/2022.   08/26/2017:  - Left ventricle: The cavity size was normal. Systolic function was     normal. The estimated ejection fraction was in the range of 60%    to 65%. Wall motion was normal; there were no regional wall    motion abnormalities. Doppler parameters are consistent  with    abnormal left ventricular relaxation (grade 1 diastolic    dysfunction). Doppler parameters are consistent with    indeterminate ventricular filling pressure.  - Aortic valve: Transvalvular velocity was within the normal range.    There was no stenosis. There was trivial regurgitation.  - Mitral valve: Transvalvular velocity was within the normal range.    There was no evidence for stenosis. There was mild regurgitation.  - Left atrium: The atrium was mildly dilated.  - Right ventricle: The cavity size was normal. Wall thickness was    normal. Systolic function was normal.  - Tricuspid valve: There was trivial regurgitation.  - Pulmonary arteries: Systolic pressure was within the normal    range.   Patient Profile   83 y.o. female w/PMHx of COPD, emphysema w/known chronic RML collapse, (former smoker),  OSA, DM, HTN, HLD, meningioma dementia AO atherosclerosis/Coronary Ca++  Hx of pancreatitis (Aug 2024)  Admitted with bradycardia, Mobitz II AV block  Assessment and Plan:    Advanced AVblock Baseline conduction system disease with RBBB/bifascicular/trifascicular block in the last couple years) Mobitz II w/variable conduction No reversible causes  She was not taking donepezil  at home per her son   S/p PPM yesterday 10/22 w/Dr. Kennyth CXR this morning with no PTX Device check this morning with stable measurements Site is stable, mild swelling, no hematoma, bleeding, minimal achin/tenderness Wound care and activity restrictions were discussed with the patient, her sister [name] (bedside that she lives with) and her son [name] (on the phone) EP follow up is in place   3. HTN Much improved  4. ATach Observed to have a intermittent ATach during her procedure and on tele  She has an  ATach w/HR low 100's 107-108bpm Recommend Toprol 25mg  daily for this and reduce her home lisinopril    EP MD has been bedside OK to discharge from our perspective when felt ready medically otherwise   For questions or updates, please contact [facility] Please consult [email or web address] for contact info under   Signed, Charlies Macario Arthur, PA-C  12/25/2023, 9:52 AM

## 2023-12-25 NOTE — Plan of Care (Signed)
  Problem: Education: Goal: Ability to describe self-care measures that may prevent or decrease complications (Diabetes Survival Skills Education) will improve Outcome: Progressing Goal: Individualized Educational Video(s) Outcome: Progressing   Problem: Coping: Goal: Ability to adjust to condition or change in health will improve Outcome: Progressing   Problem: Fluid Volume: Goal: Ability to maintain a balanced intake and output will improve Outcome: Progressing   Problem: Health Behavior/Discharge Planning: Goal: Ability to identify and utilize available resources and services will improve Outcome: Progressing Goal: Ability to manage health-related needs will improve Outcome: Progressing   Problem: Metabolic: Goal: Ability to maintain appropriate glucose levels will improve Outcome: Progressing   Problem: Nutritional: Goal: Maintenance of adequate nutrition will improve Outcome: Progressing Goal: Progress toward achieving an optimal weight will improve Outcome: Progressing   Problem: Skin Integrity: Goal: Risk for impaired skin integrity will decrease Outcome: Progressing   Problem: Tissue Perfusion: Goal: Adequacy of tissue perfusion will improve Outcome: Progressing   Problem: Education: Goal: Knowledge of General Education information will improve Description: Including pain rating scale, medication(s)/side effects and non-pharmacologic comfort measures Outcome: Progressing   Problem: Health Behavior/Discharge Planning: Goal: Ability to manage health-related needs will improve Outcome: Progressing   Problem: Clinical Measurements: Goal: Ability to maintain clinical measurements within normal limits will improve Outcome: Progressing Goal: Will remain free from infection Outcome: Progressing Goal: Diagnostic test results will improve Outcome: Progressing Goal: Respiratory complications will improve Outcome: Progressing Goal: Cardiovascular complication will  be avoided Outcome: Progressing   Problem: Activity: Goal: Risk for activity intolerance will decrease Outcome: Progressing   Problem: Nutrition: Goal: Adequate nutrition will be maintained Outcome: Progressing   Problem: Coping: Goal: Level of anxiety will decrease Outcome: Progressing   Problem: Elimination: Goal: Will not experience complications related to bowel motility Outcome: Progressing Goal: Will not experience complications related to urinary retention Outcome: Progressing   Problem: Pain Managment: Goal: General experience of comfort will improve and/or be controlled Outcome: Progressing   Problem: Safety: Goal: Ability to remain free from injury will improve Outcome: Progressing   Problem: Skin Integrity: Goal: Risk for impaired skin integrity will decrease Outcome: Progressing   Problem: Education: Goal: Knowledge of cardiac device and self-care will improve Outcome: Progressing Goal: Ability to safely manage health related needs after discharge will improve Outcome: Progressing Goal: Individualized Educational Video(s) Outcome: Progressing   Problem: Cardiac: Goal: Ability to achieve and maintain adequate cardiopulmonary perfusion will improve Outcome: Progressing

## 2023-12-25 NOTE — Discharge Instructions (Signed)
 After Your Pacemaker   You have a Medtronic Pacemaker  If you have a Medtronic or Biotronik device, plug in your home monitor once you get home, and no manual interaction is required.   If you have an [employer] or [employer] device, plug your home monitor once you get home, sit near the device, and press the large activation button. Sit nearby until the process is complete, usually notated by lights on the monitor.   If you were set up for monitoring using an app on your phone, make sure the app remains open in the background and the Bluetooth remains on.  ACTIVITY Do not lift your arm above shoulder height for 1 week after your procedure. After 7 days, you may progress as below.  You should remove your sling 24 hours after your procedure, unless otherwise instructed by your provider.     Thursday January 01, 2024  Friday January 02, 2024 Saturday January 03, 2024 Sunday January 04, 2024   Do not lift, push, pull, or carry anything over 10 pounds with the affected arm until 6 weeks (Thursday February 05, 2024 ) after your procedure.   You may drive AFTER your wound check, unless you have been told otherwise by your provider.   Ask your healthcare provider when you can go back to work   INCISION/Dressing If you are on a blood thinner such as Coumadin, Xarelto, Eliquis, Plavix, or Pradaxa please confirm with your provider when this should be resumed.   If large square, outer bandage is left in place, this can be removed after 24 hours from your procedure. Do not remove steri-strips or glue as below.   If a PRESSURE DRESSING (a bulky dressing that usually goes up over your shoulder) was applied or left in place, please follow instructions given by your provider on when to return to have this removed.   Monitor your Pacemaker site for redness, swelling, and drainage. Call the device clinic at [phone] if you experience these symptoms or fever/chills.  If your incision is sealed  with Steri-strips or staples, you may shower 7 days after your procedure or when told by your provider. Do not remove the steri-strips or let the shower hit directly on your site. You may wash around your site with soap and water.    If you were discharged in a sling, please do not wear this during the day more than 48 hours after your surgery unless otherwise instructed. This may increase the risk of stiffness and soreness in your shoulder.   Avoid lotions, ointments, or perfumes over your incision until it is well-healed.  You may use a hot tub or a pool AFTER your wound check appointment if the incision is completely closed.  Pacemaker Alerts:  Some alerts are vibratory and others beep. These are NOT emergencies. Please call our office to let us  know. If this occurs at night or on weekends, it can wait until the next business day. Send a remote transmission.  If your device is capable of reading fluid status (for heart failure), you will be offered monthly monitoring to review this with you.   DEVICE MANAGEMENT Remote monitoring is used to monitor your pacemaker from home. This monitoring is scheduled every 91 days by our office. It allows us  to keep an eye on the functioning of your device to ensure it is working properly. You will routinely see your Electrophysiologist annually (more often if necessary).  This will appear as a REMOTE check on your  MyChart schedule. These are automatic and there is nothing for you to manually do unless otherwise instructed.  You should receive your ID card for your new device in 4-8 weeks. Keep this card with you at all times once received. Consider wearing a medical alert bracelet or necklace.  Your Pacemaker may be MRI compatible. This will be discussed at your next office visit/wound check.  You should avoid contact with strong electric or magnetic fields.   Do not use amateur (ham) radio equipment or electric (arc) welding torches. MP3 player headphones  with magnets should not be used. Some devices are safe to use if held at least 12 inches (30 cm) from your Pacemaker. These include power tools, lawn mowers, and speakers. If you are unsure if something is safe to use, ask your health care provider.  When using your cell phone, hold it to the ear that is on the opposite side from the Pacemaker. Do not leave your cell phone in a pocket over the Pacemaker.  You may safely use electric blankets, heating pads, computers, and microwave ovens.  Call the office right away if: You have chest pain. You feel more short of breath than you have felt before. You feel more light-headed than you have felt before. Your incision starts to open up.  This information is not intended to replace advice given to you by your health care provider. Make sure you discuss any questions you have with your health care provider.

## 2023-12-25 NOTE — Progress Notes (Signed)
 Progress Note   Patient: Angel Tanner FMW:[id number] DOB: 08-13-1940 DOA: 12/23/2023     2 DOS: the patient was seen and examined on 12/25/2023   Assessment and Plan:  #Bradycardia #2:1 AV block #Intermittent complete heart block s/p PPM placement on 10/23 - Patient bradycardic to 30s on initial presentation without identifiable electrolyte abnormalities or pharmacologic agents (not taking donepezil  despite prescription), TSH wnl.  Was instructed to present to ED by her doctors office - EKG showed sinus bradycardia with 2:1 block, RBBB and LFPB - Cardiology consulted in ED - recommended echo and EP evaluation  - Underwent placement of permanent pacemaker by Dr. Kennyth on 12/24/2023 - EP cleared for discharge today with wound care instructions and activity restrictions discussed with the patient, her sister [name], and her son [name] (over the phone). Set up for outpatient EP follow up.  #Hypertension - BP better controlled this morning, SBP 109-140s - Lisinopril  initially held given hyperkalemia, now held due to AKI - Continue hydrochlorothiazide  to 25 mg daily - Continue p.o. hydralazine 25 mg 3 times daily - Might need to hold lisinopril  on discharge and follow up with PCP  #AKI - Creatine elevated to 1.23 today from 0.85 yesterday - Etiology unclear, possibly due to decreased PO intake and NPO status although not prolonged - Ordered LR at 100 mL/hr for 10 hours  #HAGMA - Bicarb 20, AG 16 today - Etiology unclear, possibly related to AKI - Lactate and BhB ordered - LR at 100 mL/hr for 10 hours  # Atrial Tachycardia - Per cardiology, patient was observed to have intermittent atrial tachycardia during her procedure and on telemetry with heart rates in the 100s - Was started on Toprol 25 mg daily per cardiology  #Hyperkalemia - resolved - Improved after Lokelma on 10/22 - K 4.5 today  #Hypomagnesemia - resolved  #Hypocalcemia - resolved  #T2DM - Hgb A1c 7.7 -  SSI  #Hyperlipidemia - Continue Crestor  10  #CAD - Noted to have a normal perfusion study in 2024 - Continue aspirin  and Crestor   #Dementia - Per family, patient has history of sundowning.  Family planning on being present during hospitalization to help mitigate this.  #COPD - Not on any inhalers at home - PRN albuterol  nebs - Stable     Subjective: Patient seen at bedside today. Patient's sister and nephew were at bedside. Patient's son, [name], joined the conversation by phone.  Patient reports doing well without any complaints other than mild tenderness at the PPM site. She had eaten 1/3 of a chicken salad sandwich. Answered family's questions.  Family requested overnight monitoring given patient's somewhat labile blood pressures, which is reasonable, specially given AKI now. Denies any chest pain, SOB, N/V, fevers, chills.   Physical Exam: Vitals:   12/25/23 0751 12/25/23 1114 12/25/23 1241 12/25/23 1551  BP: (!) 140/75 109/72 122/70 124/69  Pulse: 100 (!) 107 (!) 108 (!) 105  Resp: 20 16  20   Temp: 98.7 F (37.1 C) 98.9 F (37.2 C)  98.5 F (36.9 C)  TempSrc: Oral Oral  Oral  SpO2: 96% 94%  95%  Weight:      Height:       Physical Exam Constitutional:      General: She is not in acute distress.    Appearance: She is not ill-appearing.  Cardiovascular:     Rate and Rhythm: Normal rate and regular rhythm.     Heart sounds: Normal heart sounds. No murmur heard. Pulmonary:     Effort:  Pulmonary effort is normal. No respiratory distress.     Breath sounds: Normal breath sounds.  Chest:     Comments: Pacemaker pocket with mild swelling and appropriate tenderness. No active drainage. Some dried blood on dressing.  Abdominal:     Palpations: Abdomen is soft.     Tenderness: There is no abdominal tenderness.  Musculoskeletal:     Right lower leg: No edema.     Left lower leg: No edema.  Skin:    General: Skin is warm and dry.     Capillary Refill: Capillary refill  takes less than 2 seconds.  Neurological:     Mental Status: She is alert and oriented to person, place, and time. Mental status is at baseline.     Family Communication: Updated patient's sister and nephew at bedside as well as son, [name], on the phone  Disposition: Status is: Inpatient Remains inpatient appropriate because: s/p PPM placement, with AKI and HAGMA  Planned Discharge Destination: Home with Home Health    Time spent: 37 minutes  Author: Duffy Larch, MD 12/25/2023 6:16 PM  For on call review [email or web address].

## 2023-12-25 NOTE — Plan of Care (Signed)
  Problem: Education: Goal: Ability to safely manage health related needs after discharge will improve Outcome: Progressing   Problem: Education: Goal: Knowledge of cardiac device and self-care will improve Outcome: Progressing   Problem: Skin Integrity: Goal: Risk for impaired skin integrity will decrease Outcome: Progressing   Problem: Safety: Goal: Ability to remain free from injury will improve Outcome: Progressing   Problem: Activity: Goal: Risk for activity intolerance will decrease Outcome: Progressing   Problem: Clinical Measurements: Goal: Will remain free from infection Outcome: Progressing   Problem: Education: Goal: Ability to describe self-care measures that may prevent or decrease complications (Diabetes Survival Skills Education) will improve Outcome: Progressing

## 2023-12-26 ENCOUNTER — Other Ambulatory Visit (HOSPITAL_COMMUNITY): Payer: Self-pay

## 2023-12-26 LAB — CBC
HCT: 42.3 % (ref 36.0–46.0)
Hemoglobin: 13 g/dL (ref 12.0–15.0)
MCH: 28.5 pg (ref 26.0–34.0)
MCHC: 30.7 g/dL (ref 30.0–36.0)
MCV: 92.8 fL (ref 80.0–100.0)
Platelets: 143 K/uL — ABNORMAL LOW (ref 150–400)
RBC: 4.56 MIL/uL (ref 3.87–5.11)
RDW: 13.2 % (ref 11.5–15.5)
WBC: 5.9 K/uL (ref 4.0–10.5)
nRBC: 0 % (ref 0.0–0.2)

## 2023-12-26 LAB — GLUCOSE, CAPILLARY
Glucose-Capillary: 135 mg/dL — ABNORMAL HIGH (ref 70–99)
Glucose-Capillary: 237 mg/dL — ABNORMAL HIGH (ref 70–99)
Glucose-Capillary: 149 mg/dL — ABNORMAL HIGH (ref 70–99)

## 2023-12-26 LAB — BASIC METABOLIC PANEL WITH GFR
Anion gap: 8 (ref 5–15)
BUN: 16 mg/dL (ref 8–23)
CO2: 28 mmol/L (ref 22–32)
Calcium: 8.8 mg/dL — ABNORMAL LOW (ref 8.9–10.3)
Chloride: 98 mmol/L (ref 98–111)
Creatinine, Ser: 0.81 mg/dL (ref 0.44–1.00)
GFR, Estimated: >60 mL/min (ref >60)
Glucose, Bld: 211 mg/dL — ABNORMAL HIGH (ref 70–99)
Potassium: 4.3 mmol/L (ref 3.5–5.1)
Sodium: 134 mmol/L — ABNORMAL LOW (ref 135–145)

## 2023-12-26 MED ORDER — METOPROLOL SUCCINATE ER 25 MG PO TB24
25.0000 mg | ORAL_TABLET | Freq: Every day | ORAL | 0 refills | Status: AC
  Filled 2023-12-26: qty 30, 30d supply, fill #0

## 2023-12-26 NOTE — Discharge Summary (Addendum)
 Physician Discharge Summary   Patient: Angel Tanner MRN: [id number] DOB: 08-05-1940  Admit date:     12/23/2023  Discharge date: 12/26/23  Discharge Physician: Duffy Al-Sultani   PCP: Rexanne Ingle, MD   Recommendations at discharge:   Check your blood pressure twice a day--once in the morning and once at bedtime--record your readings in a log, and bring it to your next appointment with your primary care provider to help guide any needed changes to your treatment. Follow up with PCP to repeat lab work, review discharge medications, and again answered adjustments Follow-up with electrophysiology clinic as scheduled  Discharge Diagnoses: Principal Problem:   Bradycardia Active Problems:   Type II diabetes mellitus   Hypercholesterolemia   Unspecified glaucoma   Benign essential hypertension   COPD (chronic obstructive pulmonary disease)   Complete heart block (HCC)   Status post placement of cardiac pacemaker   Angel Tanner is a 83 y.o. female with history of diabetes mellitus type 2, COPD, OSA, meningioma, hyperlipidemia, memory issues was brought to the ER after patient was having persistent bradycardia.  Patient was observed to have sinus bradycardia about a week ago by primary care physician.  It has been persistent and was advised to come to the ER.  Patient denies any chest pain dizziness shortness of breath.  Patient states she has never taken Aricept  though it was prescribed.  Patient is not on any beta-blockers or any rate limiting medications.   ED Course: In the ER patient is found bradycardic with 2:1 block.  Cardiology was consulted.  TSH is 1.4.  Labs show potassium of 4.  Creatinine 1.1.  Hemoglobin 14.8.  Patient admitted for further management of bradycardia.  #Bradycardia #2:1 AV block #Intermittent complete heart block s/p PPM placement on 10/23 - Patient bradycardic to 30s on initial presentation without identifiable electrolyte abnormalities or  pharmacologic agents (not taking donepezil  despite prescription), TSH wnl.  Was instructed to present to ED by her doctors office - EKG showed sinus bradycardia with 2:1 block, RBBB and LFPB - Cardiology consulted in ED - recommended echo and EP evaluation  - Underwent placement of permanent pacemaker by Dr. Kennyth on 12/24/2023 - EP cleared for discharge today with wound care instructions and activity restrictions discussed with the patient, her sister Angel Tanner, and her son Angel Tanner (over the phone). Set up for outpatient EP follow up.   #Hypertension - Lisinopril  was held during the hospitalization due to hyperkalemia then due to AKI, both of which had resolved at the time of discharge.  - Discharged on hydrochlorothiazide  to 25 mg daily and lisinopril  10 mg daily. Instructed to keep a BP of AM and PM BP readings and bring to follow up with PCP to make any necessary adjustments.   #AKI - resolved - Creatine elevated to 1.23 during her hospitalization from 0.85 the day prior - Etiology unclear, possibly due to decreased PO intake and NPO status although not prolonged - Resolved with IVFs - Cr 0.81 at the time of discharge   #HAGMA - resolved - Elevation of Bicarb 20, AG 16 noted during hospitalization - Etiology unclear, possibly related to AKI - Lactate and BhB were wnl - Resolved with IVFs and resolution of AKI   # Atrial Tachycardia - Per cardiology, patient was observed to have intermittent atrial tachycardia during her procedure and on telemetry with heart rates in the 100s - Was started on Toprol 25 mg daily per cardiology   #Hyperkalemia - resolved - Improved after [facility]  on 10/22 - K 4.3 at time of discharge   #Hypomagnesemia - resolved   #Hypocalcemia - resolved   #T2DM - Hgb A1c 7.7 - Resumed home Invokana  and metformin  on discharge   #Hyperlipidemia - Continue Crestor  10   #CAD - Noted to have a normal perfusion study in 2024 - Continue aspirin  and Crestor     #Dementia - Per family, patient has history of sundowning but no issues were encountered during the hospitalization    #COPD - Not on any inhalers at home - PRN albuterol  nebs were ordered during hospitalization - Remained stable       Consultants: Cardiology Procedures performed: [employer] pacemaker placement Disposition: Home Diet recommendation:  Discharge Diet Orders (From admission, onward)     Start     Ordered   12/26/23 0000  Diet - low sodium heart healthy        12/26/23 1446            DISCHARGE MEDICATION: Allergies as of 12/26/2023       Reactions   Iodine Nausea And Vomiting   With CT scans, tolerated with Zofran  and 12.5 mg benadryl  (no other premeds) 8/3. Likely adverse reaction rather than allergy   Iodinated Contrast Media Nausea And Vomiting   Nausea and vomiting even at slow injection rate Other Reaction(s): GI Intolerance Other Reaction(s): vomiting/nauseasted        Medication List     STOP taking these medications    meloxicam 15 MG tablet Commonly known as: MOBIC       TAKE these medications    aspirin  81 MG chewable tablet Chew 81 mg by mouth at bedtime.   CALCIUM  + D3 PO Take 1 tablet by mouth daily.   cyanocobalamin  1000 MCG tablet Commonly known as: VITAMIN B12 Take 1,000 mcg by mouth daily.   donepezil  10 MG tablet Commonly known as: ARICEPT  Take 1 tablet daily   hydrochlorothiazide  12.5 MG tablet Commonly known as: HYDRODIURIL  Take 12.5 mg by mouth every morning.   Invokana  300 MG Tabs tablet Generic drug: canagliflozin  Take 300 mg by mouth daily before breakfast.   latanoprost 0.005 % ophthalmic solution Commonly known as: XALATAN Place 1 drop into both eyes at bedtime.   lisinopril  10 MG tablet Commonly known as: ZESTRIL  Take 1 tablet by mouth daily.   metFORMIN  500 MG 24 hr tablet Commonly known as: GLUCOPHAGE -XR Take 3 tablets (1,500 mg total) by mouth daily with supper.   metoprolol succinate  25 MG 24 hr tablet Commonly known as: TOPROL-XL Take 1 tablet (25 mg total) by mouth daily. Start taking on: December 27, 2023   MiraLax  17 GM/SCOOP powder Generic drug: polyethylene glycol powder Take 119 g by mouth daily as needed for mild constipation.   OneTouch Verio test strip Generic drug: glucose blood USE TO CHECK BLOOD GLUCOSE ONCE DAILY   Polyethyl Glycol-Propyl Glycol 0.4-0.3 % Soln Place 1 drop into both eyes 2 (two) times daily as needed (dry/irritated eyes.).   ProAir  RespiClick 108 (90 Base) MCG/ACT Aepb Generic drug: Albuterol  Sulfate INHALE TWO PUFFS into lungs EVERY 4 HOURS AS NEEDED FOR WHEEZING, FOR SHORTNESS OF BREATH AND FOR tightness   albuterol  108 (90 Base) MCG/ACT inhaler Commonly known as: VENTOLIN  HFA Inhale 2 puffs into the lungs every 6 (six) hours as needed for wheezing or shortness of breath.   rosuvastatin  10 MG tablet Commonly known as: CRESTOR  Take 10 mg by mouth at bedtime.        Follow-up Information  Rexanne Ingle, MD. Schedule an appointment as soon as possible for a visit in 1 week(s).   Specialty: Internal Medicine Contact information: [address] [place] [address] [phone]                Discharge Exam: Angel Tanner   12/23/23 1308 12/23/23 2336  Weight: 76.3 kg 75 kg   Physical Exam Constitutional:      General: She is not in acute distress.    Appearance: She is not ill-appearing.  Cardiovascular:     Rate and Rhythm: Normal rate and regular rhythm.     Heart sounds: Normal heart sounds. No murmur heard. Pulmonary:     Effort: Pulmonary effort is normal. No respiratory distress.     Breath sounds: Normal breath sounds.  Chest:     Comments: Pacemaker pocket with mild swelling and appropriate tenderness. No active drainage or warmth. Some dried blood on dressing. Abdominal:     Palpations: Abdomen is soft.     Tenderness: There is no abdominal tenderness.  Musculoskeletal:      Right lower leg: No edema.     Left lower leg: No edema.  Skin:    General: Skin is warm and dry.     Capillary Refill: Capillary refill takes less than 2 seconds.  Neurological:     Mental Status: She is alert and oriented to person, place, and time. Mental status is at baseline.    Condition at discharge: good  The results of significant diagnostics from this hospitalization (including imaging, microbiology, ancillary and laboratory) are listed below for reference.   Imaging Studies: DG Chest 2 View Result Date: 12/25/2023 EXAM: 2 VIEW(S) XRAY OF THE CHEST 12/25/2023 07:01:00 AM COMPARISON: 12/23/2023 CLINICAL HISTORY: Cardiac device in situ, other [id number]. Post pacemaker.hx bradycardia FINDINGS: LINES, TUBES AND DEVICES: New left subclavian dual lead pacemaker in place. Leads are in the expected location of the right atrial appendage and right ventricle. LUNGS AND PLEURA: No focal pulmonary opacity. No pulmonary edema. No pleural effusion. No pneumothorax. HEART AND MEDIASTINUM: Aortic atherosclerosis. No acute abnormality of the cardiac and mediastinal silhouettes. BONES AND SOFT TISSUES: No acute osseous abnormality. IMPRESSION: 1. New left subclavian dual-lead pacemaker in place. No pneumothorax identified after pacer placement. Electronically signed by: Waddell Calk MD 12/25/2023 07:19 AM EDT RP Workstation: [id number]   EP PPM/ICD IMPLANT Result Date: 12/24/2023  CONCLUSIONS:  1. Successful dual chamber pacemaker implant with LBBAP lead.  2. No early apparent complications. Fonda Kitty, MD, [facility], [facility] Cardiac Electrophysiology   ECHOCARDIOGRAM COMPLETE Result Date: 12/24/2023    ECHOCARDIOGRAM REPORT   Patient Name:   Angel Tanner Date of Exam: 12/24/2023 Medical Rec #:  [id number]        Height:       61.0 in Accession #:    [id number]       Weight:       165.4 lb Date of Birth:  [birth year]        BSA:          1.742 m Patient Age:    83 years         BP:           172/82  mmHg Patient Gender: F                HR:           45 bpm. Exam Location:  Inpatient Procedure: 2D Echo, Cardiac Doppler, Color Doppler and Intracardiac  Opacification Agent (Both Spectral and Color Flow Doppler were            utilized during procedure). Indications:    Z01.818 Encounter for other preprocedural examination; R94.31                 Abnormal EKG  History:        Patient has prior history of Echocardiogram examinations, most                 recent 08/26/2017.  Sonographer:    Ellouise Mose RDCS Referring Phys: [id number] [facility] A SANTO  Sonographer Comments: Technically difficult study due to poor echo windows and patient is obese. Image acquisition challenging due to patient body habitus. Patient supine, rate dropped to 39 during exam. IMPRESSIONS  1. Left ventricular ejection fraction, by estimation, is 60 to 65%. The left ventricle has normal function. The left ventricle has no regional wall motion abnormalities. The left ventricular internal cavity size was moderately to severely dilated. There  is mild left ventricular hypertrophy. Left ventricular diastolic parameters were normal.  2. Right ventricular systolic function is normal. The right ventricular size is normal. Tricuspid regurgitation signal is inadequate for assessing PA pressure.  3. The mitral valve is grossly normal. Trivial mitral valve regurgitation. No evidence of mitral stenosis. Moderate mitral annular calcification.  4. The aortic valve was not well visualized. There is moderate calcification of the aortic valve. Aortic valve regurgitation is trivial. Aortic valve sclerosis is present, with no evidence of aortic valve stenosis.  5. The inferior vena cava is normal in size with greater than 50% respiratory variability, suggesting right atrial pressure of 3 mmHg. Comparison(s): No significant change from prior study. FINDINGS  Left Ventricle: Left ventricular ejection fraction, by estimation, is 60 to 65%. The left  ventricle has normal function. The left ventricle has no regional wall motion abnormalities. Definity contrast agent was given IV to delineate the left ventricular  endocardial borders. The left ventricular internal cavity size was moderately to severely dilated. There is mild left ventricular hypertrophy. Left ventricular diastolic parameters were normal. Right Ventricle: The right ventricular size is normal. Right vetricular wall thickness was not well visualized. Right ventricular systolic function is normal. Tricuspid regurgitation signal is inadequate for assessing PA pressure. Left Atrium: Left atrial size was normal in size. Right Atrium: Right atrial size was normal in size. Pericardium: Trivial pericardial effusion is present. Mitral Valve: The mitral valve is grossly normal. Moderate mitral annular calcification. Trivial mitral valve regurgitation. No evidence of mitral valve stenosis. MV peak gradient, 11.0 mmHg. The mean mitral valve gradient is 2.0 mmHg. Tricuspid Valve: The tricuspid valve is not well visualized. Tricuspid valve regurgitation is not demonstrated. No evidence of tricuspid stenosis. Aortic Valve: The aortic valve was not well visualized. There is moderate calcification of the aortic valve. Aortic valve regurgitation is trivial. Aortic valve sclerosis is present, with no evidence of aortic valve stenosis. Aortic valve mean gradient measures 6.0 mmHg. Aortic valve peak gradient measures 11.8 mmHg. Aortic valve area, by VTI measures 2.51 cm. Pulmonic Valve: The pulmonic valve was not well visualized. Pulmonic valve regurgitation is not visualized. No evidence of pulmonic stenosis. Aorta: The aortic root and ascending aorta are structurally normal, with no evidence of dilitation. Venous: The inferior vena cava is normal in size with greater than 50% respiratory variability, suggesting right atrial pressure of 3 mmHg. IAS/Shunts: No atrial level shunt detected by color flow Doppler.  LEFT  VENTRICLE PLAX 2D LVIDd:  5.30 cm      Diastology LVIDs:         3.00 cm      LV e' medial:    12.60 cm/s LV PW:         1.00 cm      LV E/e' medial:  11.7 LV IVS:        1.00 cm      LV e' lateral:   11.10 cm/s LVOT diam:     2.10 cm      LV E/e' lateral: 13.3 LV SV:         105 LV SV Index:   60 LVOT Area:     3.46 cm  LV Volumes (MOD) LV vol d, MOD A2C: 87.1 ml LV vol d, MOD A4C: 103.0 ml LV vol s, MOD A2C: 32.7 ml LV vol s, MOD A4C: 35.6 ml LV SV MOD A2C:     54.4 ml LV SV MOD A4C:     103.0 ml LV SV MOD BP:      59.9 ml RIGHT VENTRICLE            IVC RV S prime:     8.05 cm/s  IVC diam: 1.30 cm TAPSE (M-mode): 1.8 cm LEFT ATRIUM           Index        RIGHT ATRIUM           Index LA diam:      3.00 cm 1.72 cm/m   RA Area:     10.50 cm LA Vol (A2C): 15.3 ml 8.78 ml/m   RA Volume:   20.20 ml  11.59 ml/m LA Vol (A4C): 23.0 ml 13.20 ml/m  AORTIC VALVE AV Area (Vmax):    2.71 cm AV Area (Vmean):   2.58 cm AV Area (VTI):     2.51 cm AV Vmax:           172.00 cm/s AV Vmean:          109.000 cm/s AV VTI:            0.420 m AV Peak Grad:      11.8 mmHg AV Mean Grad:      6.0 mmHg LVOT Vmax:         134.50 cm/s LVOT Vmean:        81.150 cm/s LVOT VTI:          0.304 m LVOT/AV VTI ratio: 0.72  AORTA Ao Root diam: 3.30 cm MITRAL VALVE MV Area (PHT): 3.77 cm     SHUNTS MV Area VTI:   1.51 cm     Systemic VTI:  0.30 m MV Peak grad:  11.0 mmHg    Systemic Diam: 2.10 cm MV Mean grad:  2.0 mmHg MV Vmax:       1.66 m/s MV Vmean:      66.0 cm/s MV Decel Time: 201 msec MV E velocity: 148.00 cm/s MV A velocity: 112.00 cm/s MV E/A ratio:  1.32 Angel Bruckner MD Electronically signed by Angel Bruckner MD Signature Date/Time: 12/24/2023/3:43:45 PM    Final    DG Chest Portable 1 View Result Date: 12/23/2023 EXAM: 1 VIEW(S) XRAY OF THE CHEST 12/23/2023 02:03:00 PM COMPARISON: 10/04/2022 CLINICAL HISTORY: Bradycardia, hypertension. Triage notes: Pt reports she was told by her Drs office to come to  the ER. Pt does not know why they told her to come. She has no complaints at this time. Per Drs office pt was sent due  to bradycardia, hypoxia, and dementia. FINDINGS: LUNGS AND PLEURA: Stable bibasilar atelectasis or scarring. No focal pulmonary opacity. No pulmonary edema. No pleural effusion. No pneumothorax. HEART AND MEDIASTINUM: Cardiomegaly and ectasia of thoracic aorta. Aortic atherosclerotic calcification. BONES AND SOFT TISSUES: No acute osseous abnormality. IMPRESSION: 1. No acute findings. 2. Cardiomegaly and ectasia of the thoracic aorta. Electronically signed by: Waddell Calk MD 12/23/2023 03:21 PM EDT RP Workstation: [id number]    Microbiology: Results for orders placed or performed during the hospital encounter of 12/23/23  Surgical PCR screen     Status: None   Collection Time: 12/24/23 11:37 AM   Specimen: Nasal Mucosa; Nasal Swab  Result Value Ref Range Status   MRSA, PCR NEGATIVE NEGATIVE Final   Staphylococcus aureus NEGATIVE NEGATIVE Final    Comment: (NOTE) The Xpert SA Assay (FDA approved for NASAL specimens in patients 56 years of age and older), is one component of a comprehensive surveillance program. It is not intended to diagnose infection nor to guide or monitor treatment. Performed at [facility], [address] [place], [place] [address]     Labs: CBC: Recent Labs  Lab 12/23/23 1245 12/23/23 1318 12/24/23 0100 12/24/23 0454 12/25/23 0716 12/26/23 1244  WBC 3.2*  --  4.1 4.4 7.8 5.9  NEUTROABS 1.8  --   --   --   --   --   HGB 14.8 16.3* 14.3 13.7 15.1* 13.0  HCT 49.0* 48.0* 46.3* 46.2* 48.0* 42.3  MCV 93.9  --  93.3 96.5 92.1 92.8  PLT 188  --  188 175 198 143*   Basic Metabolic Panel: Recent Labs  Lab 12/23/23 1318 12/24/23 0100 12/24/23 0454 12/24/23 1043 12/24/23 2043 12/25/23 1602 12/26/23 1244  NA 141  --  139  --  137 134* 134*  K 4.0  --  5.6* 3.8 4.1 4.5 4.3  CL 100  --  107  --  98 98 98  CO2  --   --  19*  --  25  20* 28  GLUCOSE 150*  --  169*  --  170* 205* 211*  BUN 11  --  12  --  12 21 16   CREATININE 0.70 0.76 0.84  --  0.85 1.23* 0.81  CALCIUM   --   --  6.7*  --  9.5 8.9 8.8*  MG  --   --  1.4*  --  2.3  --   --   PHOS  --   --   --   --  4.1  --   --    Liver Function Tests: Recent Labs  Lab 12/24/23 0454  AST 18  ALT 12  ALKPHOS 40  BILITOT 0.5  PROT 6.6  ALBUMIN 3.3*   CBG: Recent Labs  Lab 12/25/23 1113 12/25/23 1601 12/25/23 2117 12/26/23 0830 12/26/23 1155  GLUCAP 158* 205* 185* 135* 237*    Discharge time spent: 33 minutes  Signed: Duffy Larch, MD [facility] 12/26/2023

## 2023-12-26 NOTE — Plan of Care (Signed)
  Problem: Education: Goal: Ability to describe self-care measures that may prevent or decrease complications (Diabetes Survival Skills Education) will improve Outcome: Progressing Goal: Individualized Educational Video(s) Outcome: Progressing   Problem: Coping: Goal: Ability to adjust to condition or change in health will improve Outcome: Progressing   Problem: Fluid Volume: Goal: Ability to maintain a balanced intake and output will improve Outcome: Progressing   Problem: Health Behavior/Discharge Planning: Goal: Ability to identify and utilize available resources and services will improve Outcome: Progressing Goal: Ability to manage health-related needs will improve Outcome: Progressing   Problem: Metabolic: Goal: Ability to maintain appropriate glucose levels will improve Outcome: Progressing   Problem: Nutritional: Goal: Maintenance of adequate nutrition will improve Outcome: Progressing Goal: Progress toward achieving an optimal weight will improve Outcome: Progressing   Problem: Skin Integrity: Goal: Risk for impaired skin integrity will decrease Outcome: Progressing   Problem: Tissue Perfusion: Goal: Adequacy of tissue perfusion will improve Outcome: Progressing   Problem: Education: Goal: Knowledge of General Education information will improve Description: Including pain rating scale, medication(s)/side effects and non-pharmacologic comfort measures Outcome: Progressing   Problem: Health Behavior/Discharge Planning: Goal: Ability to manage health-related needs will improve Outcome: Progressing   Problem: Clinical Measurements: Goal: Ability to maintain clinical measurements within normal limits will improve Outcome: Progressing Goal: Will remain free from infection Outcome: Progressing Goal: Diagnostic test results will improve Outcome: Progressing Goal: Respiratory complications will improve Outcome: Progressing Goal: Cardiovascular complication will  be avoided Outcome: Progressing   Problem: Activity: Goal: Risk for activity intolerance will decrease Outcome: Progressing   Problem: Nutrition: Goal: Adequate nutrition will be maintained Outcome: Progressing   Problem: Coping: Goal: Level of anxiety will decrease Outcome: Progressing   Problem: Elimination: Goal: Will not experience complications related to bowel motility Outcome: Progressing Goal: Will not experience complications related to urinary retention Outcome: Progressing   Problem: Pain Managment: Goal: General experience of comfort will improve and/or be controlled Outcome: Progressing   Problem: Safety: Goal: Ability to remain free from injury will improve Outcome: Progressing   Problem: Skin Integrity: Goal: Risk for impaired skin integrity will decrease Outcome: Progressing   Problem: Education: Goal: Knowledge of cardiac device and self-care will improve Outcome: Progressing Goal: Ability to safely manage health related needs after discharge will improve Outcome: Progressing Goal: Individualized Educational Video(s) Outcome: Progressing   Problem: Cardiac: Goal: Ability to achieve and maintain adequate cardiopulmonary perfusion will improve Outcome: Progressing

## 2023-12-26 NOTE — TOC Transition Note (Signed)
 Transition of Care [facility]) - Discharge Note   Patient Details  Name: Angel Tanner MRN: [id number] Date of Birth: 21-Feb-1941  Transition of Care [facility]) CM/SW Contact:  Sudie Erminio Deems, RN Phone Number: 12/26/2023, 4:25 PM   Clinical Narrative: Patient will discharge home today. Family in need of home health services. ICM spoke with niece and she feels that the patient needs [facility] RN for medication and disease management along with a HH Aide for bath. Niece asked ICM to submit the referral to [facility]-office to contact the family over the weekend. Orders are in and the niece states patient will have additional family support in the home. No further needs identified at this time.     Final next level of care: Home w Home Health Services Barriers to Discharge: No Barriers Identified   Patient Goals and CMS Choice Patient states their goals for this hospitalization and ongoing recovery are:: Plan to return home once stable.     Discharge Plan and Services Additional resources added to the After Visit Summary for     Discharge Planning Services: CM Consult Post Acute Care Choice: Home Health            DME Agency: NA       HH Arranged: RN, Disease Management, Nurse's Aide HH Agency: [facility] Date [facility] Agency Contacted: 12/26/23 Time HH Agency Contacted: 1625 Representative spoke with at [facility] Agency: [facility]  Social Drivers of Health (SDOH) Interventions SDOH Screenings   Food Insecurity: No Food Insecurity (12/24/2023)  Housing: Low Risk  (12/24/2023)  Transportation Needs: No Transportation Needs (12/24/2023)  Utilities: Not At Risk (12/24/2023)  Depression (PHQ2-9): Low Risk  (07/16/2018)  Social Connections: Moderately Integrated (12/24/2023)  Tobacco Use: Medium Risk (12/23/2023)     Readmission Risk Interventions     No data to display

## 2023-12-26 NOTE — Care Management Important Message (Signed)
 Important Message  Patient Details  Name: PEGGYANN ZWIEFELHOFER MRN: [id number] Date of Birth: 10/19/1940   Important Message Given:  Yes - Medicare IM     Vonzell Arrie Sharps 12/26/2023, 12:55 PM

## 2023-12-27 ENCOUNTER — Other Ambulatory Visit (HOSPITAL_COMMUNITY): Payer: Self-pay

## 2023-12-27 ENCOUNTER — Other Ambulatory Visit: Payer: Self-pay

## 2023-12-27 ENCOUNTER — Encounter (HOSPITAL_COMMUNITY): Payer: Self-pay | Admitting: Pharmacy Technician

## 2023-12-27 ENCOUNTER — Telehealth: Payer: Self-pay | Admitting: Physician Assistant

## 2023-12-27 ENCOUNTER — Emergency Department (HOSPITAL_COMMUNITY)
Admission: EM | Admit: 2023-12-27 | Discharge: 2023-12-27 | Disposition: A | Discharge status: Home / Self Care | Attending: Emergency Medicine | Admitting: Emergency Medicine

## 2023-12-27 DIAGNOSIS — Z87891 Personal history of nicotine dependence: Secondary | ICD-10-CM | POA: Diagnosis not present

## 2023-12-27 DIAGNOSIS — Z4801 Encounter for change or removal of surgical wound dressing: Secondary | ICD-10-CM | POA: Diagnosis not present

## 2023-12-27 DIAGNOSIS — Z4800 Encounter for change or removal of nonsurgical wound dressing: Secondary | ICD-10-CM | POA: Diagnosis not present

## 2023-12-27 DIAGNOSIS — Z79899 Other long term (current) drug therapy: Secondary | ICD-10-CM | POA: Insufficient documentation

## 2023-12-27 DIAGNOSIS — Z950 Presence of cardiac pacemaker: Secondary | ICD-10-CM | POA: Insufficient documentation

## 2023-12-27 DIAGNOSIS — E119 Type 2 diabetes mellitus without complications: Secondary | ICD-10-CM | POA: Diagnosis not present

## 2023-12-27 DIAGNOSIS — Z7984 Long term (current) use of oral hypoglycemic drugs: Secondary | ICD-10-CM | POA: Insufficient documentation

## 2023-12-27 DIAGNOSIS — J4489 Other specified chronic obstructive pulmonary disease: Secondary | ICD-10-CM | POA: Insufficient documentation

## 2023-12-27 DIAGNOSIS — Z7982 Long term (current) use of aspirin: Secondary | ICD-10-CM | POA: Insufficient documentation

## 2023-12-27 DIAGNOSIS — E8729 Other acidosis: Secondary | ICD-10-CM

## 2023-12-27 DIAGNOSIS — Z5189 Encounter for other specified aftercare: Secondary | ICD-10-CM

## 2023-12-27 DIAGNOSIS — I442 Atrioventricular block, complete: Secondary | ICD-10-CM | POA: Insufficient documentation

## 2023-12-27 DIAGNOSIS — I10 Essential (primary) hypertension: Secondary | ICD-10-CM | POA: Insufficient documentation

## 2023-12-27 DIAGNOSIS — R001 Bradycardia, unspecified: Secondary | ICD-10-CM | POA: Diagnosis not present

## 2023-12-27 DIAGNOSIS — N179 Acute kidney failure, unspecified: Secondary | ICD-10-CM

## 2023-12-27 NOTE — ED Triage Notes (Signed)
 Pt here POV with reports of having pacemaker placed yesterday. Requesting the incision checked.

## 2023-12-27 NOTE — Telephone Encounter (Signed)
   The patient's son called the answering service after-hours today. She had a pacemaker placed on Wednesday, 12/24/23. He put patient's sister [name] on the phone who is there with her today. This morning the patient is experiencing new discharge at the pacemaker site this morning, described as tan colored, then also some blood mixed in with it. Unfortunately with it being the weekend, the office is closed for another 48 hours so cannot bring in for wound check and needs to be seen in person to determine whether infection is present. Her sister will bring her to ED. They verbalized understanding and gratitude.  Deckard Stuber N Onis Markoff, PA-C

## 2023-12-27 NOTE — Discharge Instructions (Signed)
 Call your cardiology office on Monday for a reevaluation and wound recheck.  If you develop any other new/concerning symptoms then return to the ER.

## 2023-12-27 NOTE — ED Provider Notes (Signed)
 [place] EMERGENCY DEPARTMENT AT [facility] Provider Note   CSN: [id number] Arrival date & time: 12/27/23  1521     Patient presents with: No chief complaint on file.   Angel Tanner is a 83 y.o. female.   HPI 83 year old female presents requesting her incision be checked.  3 days ago she had a pacemaker placed.  She was discharged from the hospital yesterday.  Last night there was a little bit of mild bleeding that has now resolved.  She states the area is sore but otherwise has no other complaints of chest pain or shortness of breath.  Called the on-call cardiology team and they were advised to come to the ER.  Prior to Admission medications   Medication Sig Start Date End Date Taking? Authorizing Provider  albuterol  (VENTOLIN  HFA) 108 (90 Base) MCG/ACT inhaler Inhale 2 puffs into the lungs every 6 (six) hours as needed for wheezing or shortness of breath. 02/18/23   Assaker, Darrin, MD  Albuterol  Sulfate (PROAIR  RESPICLICK) 108 (90 Base) MCG/ACT AEPB INHALE TWO PUFFS into lungs EVERY 4 HOURS AS NEEDED FOR WHEEZING, FOR SHORTNESS OF BREATH AND FOR tightness 09/28/21   Byrum, Lamar RAMAN, MD  aspirin  81 MG chewable tablet Chew 81 mg by mouth at bedtime.     [provider]  Calcium  Carb-Cholecalciferol (CALCIUM  + D3 PO) Take 1 tablet by mouth daily.    [provider]  canagliflozin  (INVOKANA ) 300 MG TABS tablet Take 300 mg by mouth daily before breakfast.    [provider]  cyanocobalamin  (VITAMIN B12) 1000 MCG tablet Take 1,000 mcg by mouth daily.    [provider]  donepezil  (ARICEPT ) 10 MG tablet Take 1 tablet daily 07/25/23   Wertman, Sara E, PA-C  hydrochlorothiazide  (HYDRODIURIL ) 12.5 MG tablet Take 12.5 mg by mouth every morning. 11/27/21   [provider]  latanoprost (XALATAN) 0.005 % ophthalmic solution Place 1 drop into both eyes at bedtime. 03/22/18   [provider]  lisinopril  (ZESTRIL ) 10 MG tablet  Take 1 tablet by mouth daily.    [provider]  metFORMIN  (GLUCOPHAGE -XR) 500 MG 24 hr tablet Take 3 tablets (1,500 mg total) by mouth daily with supper. 12/01/23   Thapa, Sudan, MD  metoprolol succinate (TOPROL-XL) 25 MG 24 hr tablet Take 1 tablet (25 mg total) by mouth daily. 12/27/23 01/26/24  Al-Sultani, Anmar, MD  ONETOUCH VERIO test strip USE TO CHECK BLOOD GLUCOSE ONCE DAILY 09/16/22   Von Pacific, MD  Polyethyl Glycol-Propyl Glycol 0.4-0.3 % SOLN Place 1 drop into both eyes 2 (two) times daily as needed (dry/irritated eyes.).    [provider]  polyethylene glycol powder (MIRALAX ) 17 GM/SCOOP powder Take 119 g by mouth daily as needed for mild constipation.    [provider]  rosuvastatin  (CRESTOR ) 10 MG tablet Take 10 mg by mouth at bedtime.     [provider]    Allergies: Iodine and Iodinated contrast media    Review of Systems  Constitutional:  Negative for fever.  Skin:  Positive for wound.    Updated Vital Signs BP 132/62   Pulse 82   Temp 99.2 F (37.3 C) (Oral)   Resp 18   SpO2 94%   Physical Exam Vitals and nursing note reviewed.  Constitutional:      Appearance: She is well-developed.  HENT:     Head: Normocephalic and atraumatic.  Cardiovascular:     Rate and Rhythm: Normal rate.  Pulmonary:  Effort: Pulmonary effort is normal.  Skin:    General: Skin is warm and dry.     Comments: See picture of left chest pacemaker site.  There is some mild swelling and bruising and tenderness.  No obvious bleeding.  Steri-Strips are in place.  Neurological:     Mental Status: She is alert.     (all labs ordered are listed, but only abnormal results are displayed) Labs Reviewed - No data to display  EKG: None  Radiology: No results found.   Procedures   Medications Ordered in the ED - No data to display                                  Medical Decision Making Amount and/or Complexity of Data Reviewed External  Data Reviewed: notes.   Discussed case with cardiology, Dr. Debarah.  He came to evaluate patient.  Feels from an incision perspective she is okay to go home and follow-up with her procedure list.  However recommends interrogation of the device which has been performed and is unremarkable.  Will discharge to follow-up with cardiology.  At this point I do not think the device is infected.     Final diagnoses:  Visit for wound check    ED Discharge Orders          Ordered    Ambulatory referral to Cardiology       Comments: If you have not heard from the Cardiology office within the next 72 hours please call [phone].   12/27/23 [id number]               Freddi Hamilton, MD 12/27/23 2316
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
## 2023-12-27 NOTE — ED Notes (Signed)
 Interrogated pacemaker x2

## 2023-12-29 ENCOUNTER — Other Ambulatory Visit (HOSPITAL_COMMUNITY): Payer: Self-pay

## 2023-12-29 ENCOUNTER — Encounter: Payer: Self-pay | Admitting: Emergency Medicine

## 2023-12-29 ENCOUNTER — Telehealth: Payer: Self-pay | Admitting: Physician Assistant

## 2023-12-29 ENCOUNTER — Telehealth: Payer: Self-pay

## 2023-12-29 NOTE — Telephone Encounter (Signed)
 Pt.s son would like call back, no other info

## 2023-12-29 NOTE — Telephone Encounter (Signed)
 Spoke to patient/patients sister who states they were unsure if site was draining. Sister advised pts son normally would tell her if he noted drainage but he did not.   No obvious drainage noted at this time per pt.   Patient offered device clinic apt 12/30/23 @ 10:00 AM. Location, date and time discussed with patient/sister.   Advised to call if any further concerns or questions arise.

## 2023-12-29 NOTE — Telephone Encounter (Signed)
 Pt son called in stating the pt wound site is leaking and they are not sure what to do. Pt son has LVM wanting a call back with further instructions

## 2023-12-30 ENCOUNTER — Ambulatory Visit: Attending: Internal Medicine

## 2023-12-30 DIAGNOSIS — I10 Essential (primary) hypertension: Secondary | ICD-10-CM | POA: Diagnosis not present

## 2023-12-30 DIAGNOSIS — I2510 Atherosclerotic heart disease of native coronary artery without angina pectoris: Secondary | ICD-10-CM | POA: Diagnosis not present

## 2023-12-30 DIAGNOSIS — K219 Gastro-esophageal reflux disease without esophagitis: Secondary | ICD-10-CM | POA: Diagnosis not present

## 2023-12-30 DIAGNOSIS — E7800 Pure hypercholesterolemia, unspecified: Secondary | ICD-10-CM | POA: Diagnosis not present

## 2023-12-30 DIAGNOSIS — E119 Type 2 diabetes mellitus without complications: Secondary | ICD-10-CM | POA: Diagnosis not present

## 2023-12-30 DIAGNOSIS — H409 Unspecified glaucoma: Secondary | ICD-10-CM | POA: Diagnosis not present

## 2023-12-30 DIAGNOSIS — M1990 Unspecified osteoarthritis, unspecified site: Secondary | ICD-10-CM | POA: Diagnosis not present

## 2023-12-30 DIAGNOSIS — Z48812 Encounter for surgical aftercare following surgery on the circulatory system: Secondary | ICD-10-CM | POA: Diagnosis not present

## 2023-12-30 DIAGNOSIS — Z950 Presence of cardiac pacemaker: Secondary | ICD-10-CM | POA: Diagnosis not present

## 2023-12-31 DIAGNOSIS — R001 Bradycardia, unspecified: Secondary | ICD-10-CM | POA: Diagnosis not present

## 2023-12-31 DIAGNOSIS — N1831 Chronic kidney disease, stage 3a: Secondary | ICD-10-CM | POA: Diagnosis not present

## 2023-12-31 DIAGNOSIS — Z950 Presence of cardiac pacemaker: Secondary | ICD-10-CM | POA: Diagnosis not present

## 2023-12-31 DIAGNOSIS — E7800 Pure hypercholesterolemia, unspecified: Secondary | ICD-10-CM | POA: Diagnosis not present

## 2024-01-02 ENCOUNTER — Other Ambulatory Visit: Payer: Self-pay | Admitting: Endocrinology

## 2024-01-02 ENCOUNTER — Other Ambulatory Visit: Payer: Self-pay | Admitting: Physician Assistant

## 2024-01-02 DIAGNOSIS — N1831 Chronic kidney disease, stage 3a: Secondary | ICD-10-CM | POA: Diagnosis not present

## 2024-01-02 DIAGNOSIS — R001 Bradycardia, unspecified: Secondary | ICD-10-CM | POA: Diagnosis not present

## 2024-01-02 DIAGNOSIS — E0822 Diabetes mellitus due to underlying condition with diabetic chronic kidney disease: Secondary | ICD-10-CM | POA: Diagnosis not present

## 2024-01-02 DIAGNOSIS — E1165 Type 2 diabetes mellitus with hyperglycemia: Secondary | ICD-10-CM

## 2024-01-02 DIAGNOSIS — I10 Essential (primary) hypertension: Secondary | ICD-10-CM | POA: Diagnosis not present

## 2024-01-02 DIAGNOSIS — E663 Overweight: Secondary | ICD-10-CM | POA: Diagnosis not present

## 2024-01-02 DIAGNOSIS — E7800 Pure hypercholesterolemia, unspecified: Secondary | ICD-10-CM | POA: Diagnosis not present

## 2024-01-02 NOTE — Telephone Encounter (Signed)
 I contacted patient and was advised by Camie Dina Cowper hold Aricept  10mg  until follow up with Camie.Voiced understanding and thanked me for calling.

## 2024-01-06 ENCOUNTER — Ambulatory Visit: Attending: Cardiology

## 2024-01-06 DIAGNOSIS — I442 Atrioventricular block, complete: Secondary | ICD-10-CM

## 2024-01-06 LAB — CUP PACEART INCLINIC DEVICE CHECK
Date Time Interrogation Session: 20251104161736
Implantable Lead Connection Status: 753985
Implantable Lead Connection Status: 753985
Implantable Lead Implant Date: 20251022
Implantable Lead Implant Date: 20251022
Implantable Lead Location: 753859
Implantable Lead Location: 753860
Implantable Lead Model: 3830
Implantable Lead Model: 5076
Implantable Pulse Generator Implant Date: 20251022
Lead Channel Impedance Value: 437 Ohm
Lead Channel Impedance Value: 703 Ohm
Lead Channel Pacing Threshold Amplitude: 0.5 V
Lead Channel Pacing Threshold Amplitude: 0.5 V
Lead Channel Pacing Threshold Pulse Width: 0.4 ms
Lead Channel Pacing Threshold Pulse Width: 0.4 ms
Lead Channel Sensing Intrinsic Amplitude: 3 mV

## 2024-01-06 NOTE — Progress Notes (Signed)
 Normal dual chamber pacemaker wound check. Presenting rhythm: AS/VP- 105. Wound well healed. Routine testing performed. Thresholds, sensing, and impedance consistent with implant measurements and at 3.5V safety margin/auto capture until 3 month visit. AT/AF burden 6.9% are false due to [facility]. RA sensitivity reprogrammed from 0.3 mV to 0.51mV. Will continue to monitor. Reviewed arm restrictions to continue for 6 weeks total post op.  Pt enrolled in remote follow-up.

## 2024-01-06 NOTE — Patient Instructions (Signed)

## 2024-01-07 ENCOUNTER — Other Ambulatory Visit: Payer: Self-pay | Admitting: Physician Assistant

## 2024-01-07 ENCOUNTER — Ambulatory Visit: Payer: Self-pay | Admitting: Cardiology

## 2024-01-07 DIAGNOSIS — E7800 Pure hypercholesterolemia, unspecified: Secondary | ICD-10-CM | POA: Diagnosis not present

## 2024-01-07 DIAGNOSIS — I2510 Atherosclerotic heart disease of native coronary artery without angina pectoris: Secondary | ICD-10-CM | POA: Diagnosis not present

## 2024-01-07 DIAGNOSIS — Z950 Presence of cardiac pacemaker: Secondary | ICD-10-CM | POA: Diagnosis not present

## 2024-01-07 DIAGNOSIS — H409 Unspecified glaucoma: Secondary | ICD-10-CM | POA: Diagnosis not present

## 2024-01-07 DIAGNOSIS — E119 Type 2 diabetes mellitus without complications: Secondary | ICD-10-CM | POA: Diagnosis not present

## 2024-01-07 DIAGNOSIS — K219 Gastro-esophageal reflux disease without esophagitis: Secondary | ICD-10-CM | POA: Diagnosis not present

## 2024-01-07 DIAGNOSIS — M1990 Unspecified osteoarthritis, unspecified site: Secondary | ICD-10-CM | POA: Diagnosis not present

## 2024-01-07 DIAGNOSIS — I10 Essential (primary) hypertension: Secondary | ICD-10-CM | POA: Diagnosis not present

## 2024-01-07 DIAGNOSIS — Z48812 Encounter for surgical aftercare following surgery on the circulatory system: Secondary | ICD-10-CM | POA: Diagnosis not present

## 2024-01-08 ENCOUNTER — Ambulatory Visit: Payer: Self-pay

## 2024-01-08 ENCOUNTER — Institutional Professional Consult (permissible substitution): Admitting: Psychology

## 2024-01-16 ENCOUNTER — Encounter: Admitting: Psychology

## 2024-01-16 DIAGNOSIS — Z48812 Encounter for surgical aftercare following surgery on the circulatory system: Secondary | ICD-10-CM | POA: Diagnosis not present

## 2024-01-16 DIAGNOSIS — Z950 Presence of cardiac pacemaker: Secondary | ICD-10-CM | POA: Diagnosis not present

## 2024-01-16 DIAGNOSIS — K219 Gastro-esophageal reflux disease without esophagitis: Secondary | ICD-10-CM | POA: Diagnosis not present

## 2024-01-16 DIAGNOSIS — E7800 Pure hypercholesterolemia, unspecified: Secondary | ICD-10-CM | POA: Diagnosis not present

## 2024-01-16 DIAGNOSIS — M1990 Unspecified osteoarthritis, unspecified site: Secondary | ICD-10-CM | POA: Diagnosis not present

## 2024-01-16 DIAGNOSIS — H409 Unspecified glaucoma: Secondary | ICD-10-CM | POA: Diagnosis not present

## 2024-01-16 DIAGNOSIS — I10 Essential (primary) hypertension: Secondary | ICD-10-CM | POA: Diagnosis not present

## 2024-01-16 DIAGNOSIS — E119 Type 2 diabetes mellitus without complications: Secondary | ICD-10-CM | POA: Diagnosis not present

## 2024-01-16 DIAGNOSIS — I2510 Atherosclerotic heart disease of native coronary artery without angina pectoris: Secondary | ICD-10-CM | POA: Diagnosis not present

## 2024-01-21 DIAGNOSIS — K219 Gastro-esophageal reflux disease without esophagitis: Secondary | ICD-10-CM | POA: Diagnosis not present

## 2024-01-21 DIAGNOSIS — I2510 Atherosclerotic heart disease of native coronary artery without angina pectoris: Secondary | ICD-10-CM | POA: Diagnosis not present

## 2024-01-21 DIAGNOSIS — Z48812 Encounter for surgical aftercare following surgery on the circulatory system: Secondary | ICD-10-CM | POA: Diagnosis not present

## 2024-01-21 DIAGNOSIS — Z950 Presence of cardiac pacemaker: Secondary | ICD-10-CM | POA: Diagnosis not present

## 2024-01-21 DIAGNOSIS — M1990 Unspecified osteoarthritis, unspecified site: Secondary | ICD-10-CM | POA: Diagnosis not present

## 2024-01-21 DIAGNOSIS — E7800 Pure hypercholesterolemia, unspecified: Secondary | ICD-10-CM | POA: Diagnosis not present

## 2024-01-21 DIAGNOSIS — I10 Essential (primary) hypertension: Secondary | ICD-10-CM | POA: Diagnosis not present

## 2024-01-21 DIAGNOSIS — H409 Unspecified glaucoma: Secondary | ICD-10-CM | POA: Diagnosis not present

## 2024-01-21 DIAGNOSIS — E119 Type 2 diabetes mellitus without complications: Secondary | ICD-10-CM | POA: Diagnosis not present

## 2024-02-01 DIAGNOSIS — I10 Essential (primary) hypertension: Secondary | ICD-10-CM | POA: Diagnosis not present

## 2024-02-01 DIAGNOSIS — E7800 Pure hypercholesterolemia, unspecified: Secondary | ICD-10-CM | POA: Diagnosis not present

## 2024-02-01 DIAGNOSIS — N1831 Chronic kidney disease, stage 3a: Secondary | ICD-10-CM | POA: Diagnosis not present

## 2024-02-01 DIAGNOSIS — E663 Overweight: Secondary | ICD-10-CM | POA: Diagnosis not present

## 2024-02-03 ENCOUNTER — Ambulatory Visit: Admitting: Pulmonary Disease

## 2024-02-04 ENCOUNTER — Ambulatory Visit: Attending: Cardiology

## 2024-02-04 DIAGNOSIS — I442 Atrioventricular block, complete: Secondary | ICD-10-CM

## 2024-02-05 LAB — CUP PACEART REMOTE DEVICE CHECK
Battery Remaining Longevity: 143 mo
Battery Voltage: 3.21 V
Brady Statistic AP VP Percent: 10.4 %
Brady Statistic AP VS Percent: 0 %
Brady Statistic AS VP Percent: 89.26 %
Brady Statistic AS VS Percent: 0.38 %
Brady Statistic RA Percent Paced: 12.13 %
Brady Statistic RV Percent Paced: 99.21 %
Date Time Interrogation Session: 20251203051249
Implantable Lead Connection Status: 753985
Implantable Lead Connection Status: 753985
Implantable Lead Implant Date: 20251022
Implantable Lead Implant Date: 20251022
Implantable Lead Location: 753859
Implantable Lead Location: 753860
Implantable Lead Model: 3830
Implantable Lead Model: 5076
Implantable Pulse Generator Implant Date: 20251022
Lead Channel Impedance Value: 323 Ohm
Lead Channel Impedance Value: 418 Ohm
Lead Channel Impedance Value: 475 Ohm
Lead Channel Impedance Value: 684 Ohm
Lead Channel Pacing Threshold Amplitude: 0.375 V
Lead Channel Pacing Threshold Amplitude: 0.625 V
Lead Channel Pacing Threshold Pulse Width: 0.4 ms
Lead Channel Pacing Threshold Pulse Width: 0.4 ms
Lead Channel Sensing Intrinsic Amplitude: 18.75 mV
Lead Channel Sensing Intrinsic Amplitude: 3.875 mV
Lead Channel Sensing Intrinsic Amplitude: 3.875 mV
Lead Channel Setting Pacing Amplitude: 3.5 V
Lead Channel Setting Pacing Amplitude: 3.5 V
Lead Channel Setting Pacing Pulse Width: 0.4 ms
Lead Channel Setting Sensing Sensitivity: 1.2 mV
Zone Setting Status: 755011
Zone Setting Status: 755011

## 2024-02-10 NOTE — Progress Notes (Signed)
 Remote PPM Transmission
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
## 2024-02-20 ENCOUNTER — Ambulatory Visit: Admitting: Physician Assistant

## 2024-02-20 ENCOUNTER — Encounter: Payer: Self-pay | Admitting: Physician Assistant

## 2024-02-20 VITALS — BP 154/83 | HR 79 | Resp 20 | Ht 61.5 in | Wt 164.0 lb

## 2024-02-20 DIAGNOSIS — D329 Benign neoplasm of meninges, unspecified: Secondary | ICD-10-CM | POA: Diagnosis not present

## 2024-02-20 DIAGNOSIS — R413 Other amnesia: Secondary | ICD-10-CM | POA: Diagnosis not present

## 2024-02-20 MED ORDER — MEMANTINE HCL 5 MG PO TABS: ORAL_TABLET | ORAL | 3 refills | Status: AC

## 2024-02-20 NOTE — Patient Instructions (Addendum)
 It was a pleasure to see you today at our office.   Recommendations:  Start Memantine  5mg  tablets.  Take 1 tablet at bedtime for 2 weeks, then 1 tablet twice daily.   Repeat imaging due in feb 2026  Repeat neuropsychological evaluation  in about 2-3 months   Follow with pulmonology for sleep apnea, wear the CPAP  Follow up in 6  months       For psychiatric meds, mood meds: Please have your primary care physician manage these medications.  If you have any severe symptoms of a stroke, or other severe issues such as confusion,severe chills or fever, etc call 911 or go to the ER as you may need to be evaluated further   For guidance regarding [facility] and if placement were needed at the facility, contact Social Worker tel: [phone]  For assessment of decision of mental capacity and competency:  Call Dr. Rosaline Nine, geriatric psychiatrist at [phone]  Counseling regarding caregiver distress, including caregiver depression, anxiety and issues regarding community resources, adult day care programs, adult living facilities, or memory care questions:  please contact your  Primary Doctor's Social Worker   Whom to call: Memory  decline, memory medications: Call our office [phone]    [email or web address]   RECOMMENDATIONS FOR ALL PATIENTS WITH MEMORY PROBLEMS: 1. Continue to exercise (Recommend 30 minutes of walking everyday, or 3 hours every week) 2. Increase social interactions - continue going to [place] and enjoy social gatherings with friends and family 3. Eat healthy, avoid fried foods and eat more fruits and vegetables 4. Maintain adequate blood pressure, blood sugar, and blood cholesterol level. Reducing the risk of stroke and cardiovascular disease also helps promoting better memory. 5. Avoid stressful situations. Live a simple life and avoid aggravations. Organize your time and prepare for the  next day in anticipation. 6. Sleep well, avoid any interruptions of sleep and avoid any distractions in the bedroom that may interfere with adequate sleep quality 7. Avoid sugar, avoid sweets as there is a strong link between excessive sugar intake, diabetes, and cognitive impairment We discussed the Mediterranean diet, which has been shown to help patients reduce the risk of progressive memory disorders and reduces cardiovascular risk. This includes eating fish, eat fruits and green leafy vegetables, nuts like almonds and hazelnuts, walnuts, and also use olive oil. Avoid fast foods and fried foods as much as possible. Avoid sweets and sugar as sugar use has been linked to worsening of memory function.  There is always a concern of gradual progression of memory problems. If this is the case, then we may need to adjust level of care according to patient needs. Support, both to the patient and caregiver, should then be put into place.      You have been referred for a neuropsychological evaluation (i.e., evaluation of memory and thinking abilities). Please bring someone with you to this appointment if possible, as it is helpful for the doctor to hear from both you and another adult who knows you well. Please bring eyeglasses and hearing aids if you wear them.    The evaluation will take approximately 3 hours and has two parts:   The first part is a clinical interview with the neuropsychologist (Dr. Richie or Dr. Jackquline). During the interview, the neuropsychologist will speak with you and the individual you brought to the appointment.    The second part of the evaluation is testing with the doctor's technician Neal or Luke). During the testing, the  technician will ask you to remember different types of material, solve problems, and answer some questionnaires. Your family member will not be present for this portion of the evaluation.   Please note: We must reserve several hours of the  neuropsychologist's time and the psychometrician's time for your evaluation appointment. As such, there is a No-Show fee of $100. If you are unable to attend any of your appointments, please contact our office as soon as possible to reschedule.      DRIVING: Regarding driving, in patients with progressive memory problems, driving will be impaired. We advise to have someone else do the driving if trouble finding directions or if minor accidents are reported. Independent driving assessment is available to determine safety of driving.   If you are interested in the driving assessment, you can contact the following:  [facility] [employer] in [place] [phone]  [facility] [phone]  [facility] [phone]  [facility] [phone] or [phone]   FALL PRECAUTIONS: Be cautious when walking. Scan the area for obstacles that may increase the risk of trips and falls. When getting up in the mornings, sit up at the edge of the bed for a few minutes before getting out of bed. Consider elevating the bed at the head end to avoid drop of blood pressure when getting up. Walk always in a well-lit room (use night lights in the walls). Avoid area rugs or power cords from appliances in the middle of the walkways. Use a walker or a cane if necessary and consider physical therapy for balance exercise. Get your eyesight checked regularly.  FINANCIAL OVERSIGHT: Supervision, especially oversight when making financial decisions or transactions is also recommended.  HOME SAFETY: Consider the safety of the kitchen when operating appliances like stoves, microwave oven, and blender. Consider having supervision and share cooking responsibilities until no longer able to participate in those. Accidents with firearms and other hazards in the house should be identified and addressed as well.   ABILITY TO BE LEFT ALONE: If patient is unable to contact 911 operator, consider  using [facility], or when the need is there, arrange for someone to stay with patients. Smoking is a fire hazard, consider supervision or cessation. Risk of wandering should be assessed by caregiver and if detected at any point, supervision and safe proof recommendations should be instituted.  MEDICATION SUPERVISION: Inability to self-administer medication needs to be constantly addressed. Implement a mechanism to ensure safe administration of the medications.      Mediterranean Diet A Mediterranean diet refers to food and lifestyle choices that are based on the traditions of countries located on the [place]. This way of eating has been shown to help prevent certain conditions and improve outcomes for people who have chronic diseases, like kidney disease and heart disease. What are tips for following this plan? Lifestyle  Cook and eat meals together with your family, when possible. Drink enough fluid to keep your urine clear or pale yellow. Be physically active every day. This includes: Aerobic exercise like running or swimming. Leisure activities like gardening, walking, or housework. Get 7-8 hours of sleep each night. If recommended by your health care provider, drink red wine in moderation. This means 1 glass a day for nonpregnant women and 2 glasses a day for men. A glass of wine equals 5 oz (150 mL). Reading food labels  Check the serving size of packaged foods. For foods such as rice and pasta, the serving size refers to the amount of cooked  product, not dry. Check the total fat in packaged foods. Avoid foods that have saturated fat or trans fats. Check the ingredients list for added sugars, such as corn syrup. Shopping  At the grocery store, buy most of your food from the areas near the walls of the store. This includes: Fresh fruits and vegetables (produce). Grains, beans, nuts, and seeds. Some of these may be available in unpackaged forms or large amounts (in bulk). Fresh  seafood. Poultry and eggs. Low-fat dairy products. Buy whole ingredients instead of prepackaged foods. Buy fresh fruits and vegetables in-season from local farmers markets. Buy frozen fruits and vegetables in resealable bags. If you do not have access to quality fresh seafood, buy precooked frozen shrimp or canned fish, such as tuna, salmon, or sardines. Buy small amounts of raw or cooked vegetables, salads, or olives from the deli or salad bar at your store. Stock your pantry so you always have certain foods on hand, such as olive oil, canned tuna, canned tomatoes, rice, pasta, and beans. Cooking  Cook foods with extra-virgin olive oil instead of using butter or other vegetable oils. Have meat as a side dish, and have vegetables or grains as your main dish. This means having meat in small portions or adding small amounts of meat to foods like pasta or stew. Use beans or vegetables instead of meat in common dishes like chili or lasagna. Experiment with different cooking methods. Try roasting or broiling vegetables instead of steaming or sauteing them. Add frozen vegetables to soups, stews, pasta, or rice. Add nuts or seeds for added healthy fat at each meal. You can add these to yogurt, salads, or vegetable dishes. Marinate fish or vegetables using olive oil, lemon juice, garlic, and fresh herbs. Meal planning  Plan to eat 1 vegetarian meal one day each week. Try to work up to 2 vegetarian meals, if possible. Eat seafood 2 or more times a week. Have healthy snacks readily available, such as: Vegetable sticks with hummus. Greek yogurt. Fruit and nut trail mix. Eat balanced meals throughout the week. This includes: Fruit: 2-3 servings a day Vegetables: 4-5 servings a day Low-fat dairy: 2 servings a day Fish, poultry, or lean meat: 1 serving a day Beans and legumes: 2 or more servings a week Nuts and seeds: 1-2 servings a day Whole grains: 6-8 servings a day Extra-virgin olive oil: 3-4  servings a day Limit red meat and sweets to only a few servings a month What are my food choices? Mediterranean diet Recommended Grains: Whole-grain pasta. Brown rice. Bulgar wheat. Polenta. Couscous. Whole-wheat bread. [place]. Vegetables: Artichokes. Beets. Broccoli. Cabbage. Carrots. Eggplant. Green beans. Chard. Kale. Spinach. Onions. Leeks. Peas. Squash. Tomatoes. Peppers. Radishes. Fruits: Apples. Apricots. Avocado. Berries. Bananas. Cherries. Dates. Figs. Grapes. Lemons. Melon. Oranges. Peaches. Plums. Pomegranate. Meats and other protein foods: Beans. Almonds. Sunflower seeds. Pine nuts. Peanuts. Cod. Salmon. Scallops. Shrimp. Tuna. Tilapia. Clams. Oysters. Eggs. Dairy: Low-fat milk. Cheese. Greek yogurt. Beverages: Water. Red wine. Herbal tea. Fats and oils: Extra virgin olive oil. Avocado oil. Grape seed oil. Sweets and desserts: Greek yogurt with honey. Baked apples. Poached pears. Trail mix. Seasoning and other foods: Basil. Cilantro. Coriander. Cumin. Mint. Parsley. Sage. Rosemary. Tarragon. Garlic. Oregano. Thyme. Pepper. Balsalmic vinegar. Tahini. Hummus. Tomato sauce. Olives. Mushrooms. Limit these Grains: Prepackaged pasta or rice dishes. Prepackaged cereal with added sugar. Vegetables: Deep fried potatoes (french fries). Fruits: Fruit canned in syrup. Meats and other protein foods: Beef. Pork. Lamb. Poultry with skin. Hot dogs. [place].  Dairy: Ice cream. Sour cream. Whole milk. Beverages: Juice. Sugar-sweetened soft drinks. Beer. Liquor and spirits. Fats and oils: Butter. Canola oil. Vegetable oil. Beef fat (tallow). Lard. Sweets and desserts: Cookies. Cakes. Pies. Candy. Seasoning and other foods: Mayonnaise. Premade sauces and marinades. The items listed may not be a complete list. Talk with your dietitian about what dietary choices are right for you. Summary The Mediterranean diet includes both food and lifestyle choices. Eat a variety of fresh fruits and  vegetables, beans, nuts, seeds, and whole grains. Limit the amount of red meat and sweets that you eat. Talk with your health care provider about whether it is safe for you to drink red wine in moderation. This means 1 glass a day for nonpregnant women and 2 glasses a day for men. A glass of wine equals 5 oz (150 mL). This information is not intended to replace advice given to you by your health care provider. Make sure you discuss any questions you have with your health care provider. Document Released: 10/12/2015 Document Revised: 11/14/2015 Document Reviewed: 10/12/2015 Elsevier Interactive Patient Education  2017 [employer].

## 2024-02-29 ENCOUNTER — Ambulatory Visit: Payer: Self-pay | Admitting: Cardiology

## 2024-03-08 ENCOUNTER — Telehealth: Payer: Self-pay | Admitting: Physician Assistant

## 2024-03-08 NOTE — Telephone Encounter (Signed)
"  error  "

## 2024-03-08 NOTE — Addendum Note (Signed)
 Addended by: Magaby Rumberger on: 03/08/2024 08:53 AM   Modules accepted: Orders

## 2024-03-09 ENCOUNTER — Ambulatory Visit: Admitting: Neurosurgery

## 2024-03-23 ENCOUNTER — Other Ambulatory Visit: Payer: Self-pay | Admitting: Physician Assistant

## 2024-03-23 NOTE — Progress Notes (Unsigned)
 "     Electrophysiology Clinic Note    Date:  03/23/2024  Patient ID:  Angel Tanner, Below [birth year], MRN [id number] PCP:  Rexanne Ingle, MD  Cardiologist:  Evalene Lunger, MD  Electrophysiologist:  Fonda Kitty, MD  Electrophysiology APP:  Bryley Kovacevic, NP    ***refresh  Discussed the use of AI scribe software for clinical note transcription with the patient, who gave verbal consent to proceed.   Patient Profile    Chief Complaint: ***  History of Present Illness: Angel Tanner is a 84 y.o. female with PMH notable for high grade HB s/p PPM, non-obs CAD, HTN, HLD, T2DM, OSA, COPD, emphysema w chronic RML collapse, dementia ; seen today for Fonda Kitty, MD for routine electrophysiology follow-up s/p Pacemaker implant.  She presented to ER 12/2023 for persistently low HR, tele revealed high grade HB and she is s/p PPM.   On follow-up today, ***  Since last being seen in our clinic the patient reports doing ***.  she denies chest pain, palpitations, dyspnea, PND, orthopnea, nausea, vomiting, dizziness, syncope, edema, weight gain, or early satiety.       Arrhythmia/Device History Dual chamber PPM MDT JP     ROS:  Please see the history of present illness. All other systems are reviewed and otherwise negative.    Physical Exam    VS:  There were no vitals taken for this visit. BMI: There is no height or weight on file to calculate BMI.           Wt Readings from Last 3 Encounters:  02/20/24 164 lb (74.4 kg)  12/23/23 165 lb 6.4 oz (75 kg)  12/01/23 168 lb 3.2 oz (76.3 kg)     ***  GEN- The patient is well appearing, alert and oriented x 3 today.   Lungs- Clear to ausculation bilaterally, normal work of breathing.  Heart- {Blank single:19197::Regular,Irregularly irregular} rate and rhythm, ***no murmurs, rubs or gallops Extremities- {EDEMA LEVEL:28147::No} peripheral edema, warm, dry Skin-  *** device pocket well-healed, no tethering   Device  interrogation done today and reviewed by myself:  Battery *** Lead thresholds, impedence, sensing stable *** *** episodes *** changes made today   Studies Reviewed   Previous EP, cardiology notes.    EKG {ACTION; IS/IS WNU:78978602} ordered. Personal review of EKG from {Blank single:19197::today,***} shows:  ***        TTE, 12/24/2023  1. Left ventricular ejection fraction, by estimation, is 60 to 65%. The  left ventricle has normal function. The left ventricle has no regional  wall motion abnormalities. The left ventricular internal cavity size was  moderately to severely dilated. There   is mild left ventricular hypertrophy. Left ventricular diastolic  parameters were normal.   2. Right ventricular systolic function is normal. The right ventricular  size is normal. Tricuspid regurgitation signal is inadequate for assessing  PA pressure.   3. The mitral valve is grossly normal. Trivial mitral valve  regurgitation. No evidence of mitral stenosis. Moderate mitral annular  calcification.   4. The aortic valve was not well visualized. There is moderate  calcification of the aortic valve. Aortic valve regurgitation is trivial.  Aortic valve sclerosis is present, with no evidence of aortic valve  stenosis.   5. The inferior vena cava is normal in size with greater than 50%  respiratory variability, suggesting right atrial pressure of 3 mmHg.   Comparison(s): No significant change from prior study.   Myocardial spect, 01/14/2023   Low risk,  probably normal pharmacologic myocardial perfusion stress test.   There is a small in size, moderate in severity, fixed apical defect most likely representing artifact but cannot exclude infarct.   No significant ischemia is identified.   Left ventricular systolic function is normal (LVEF 55-65%).   Coronary artery calcification and aortic atherosclerosis are noted on the attenuation correction CT.   Collapse of the right middle lobe  again noted, better evaluated on CTA chest, abdomen, and pelvis from 10/05/2022.   Assessment and Plan     #) high grade HB s/p PPM ***   #) ***   {Are you ordering a CV Procedure (e.g. stress test, cath, DCCV, TEE, etc)?   Press F2        :[id number]}   Current medicines are reviewed at length with the patient today.   The patient {ACTIONS; HAS/DOES NOT HAVE:19233} concerns regarding her medicines.  The following changes were made today:  {NONE DEFAULTED:18576}  Labs/ tests ordered today include: *** No orders of the defined types were placed in this encounter.    Disposition: Follow up with {EPMDS:28135::EP Team} or EP APP {EPFOLLOW UP:28173}   Signed, Angel Needle, NP  03/23/24  8:26 PM  Electrophysiology [facility] "

## 2024-03-24 ENCOUNTER — Ambulatory Visit: Admitting: Cardiology

## 2024-03-24 DIAGNOSIS — Z950 Presence of cardiac pacemaker: Secondary | ICD-10-CM

## 2024-03-24 DIAGNOSIS — I442 Atrioventricular block, complete: Secondary | ICD-10-CM

## 2024-03-31 NOTE — Progress Notes (Unsigned)
 "     Electrophysiology Clinic Note    Date:  03/31/2024  Patient ID:  Angel Tanner, Angel Tanner 06/10/1940, MRN [id number] PCP:  Rexanne Ingle, MD  Cardiologist:  Evalene Lunger, MD  Electrophysiologist:  Fonda Kitty, MD  Electrophysiology APP:  Danique Hartsough, NP    ***refresh  Discussed the use of AI scribe software for clinical note transcription with the patient, who gave verbal consent to proceed.   Patient Profile    Chief Complaint: ***  History of Present Illness: Angel Tanner is a 84 y.o. female with PMH notable for high grade HB s/p PPM, non-obs CAD, HTN, HLD, T2DM, OSA, COPD, emphysema w chronic RML collapse, dementia ; seen today for Fonda Kitty, MD for routine electrophysiology follow-up s/p Pacemaker implant.  She presented to ER 12/2023 for persistently low HR, tele revealed high grade HB and she is s/p PPM.   On follow-up today, *** *** chronic leads  - far R wave oversensing   Since last being seen in our clinic the patient reports doing ***.  she denies chest pain, palpitations, dyspnea, PND, orthopnea, nausea, vomiting, dizziness, syncope, edema, weight gain, or early satiety.       Arrhythmia/Device History MDT Dual chamber PPM, imp 12/2023; dx [facility]    ROS:  Please see the history of present illness. All other systems are reviewed and otherwise negative.    Physical Exam    VS:  There were no vitals taken for this visit. BMI: There is no height or weight on file to calculate BMI.           Wt Readings from Last 3 Encounters:  02/20/24 164 lb (74.4 kg)  12/23/23 165 lb 6.4 oz (75 kg)  12/01/23 168 lb 3.2 oz (76.3 kg)     ***  GEN- The patient is well appearing, alert and oriented x 3 today.   Lungs- Clear to ausculation bilaterally, normal work of breathing.  Heart- {Blank single:19197::Regular,Irregularly irregular} rate and rhythm, ***no murmurs, rubs or gallops Extremities- {EDEMA LEVEL:28147::No} peripheral edema, warm,  dry Skin-  *** device pocket well-healed, no tethering   Device interrogation done today and reviewed by myself:  Battery *** Lead thresholds, impedence, sensing stable *** *** episodes *** changes made today   Studies Reviewed   Previous EP, cardiology notes.    EKG is ordered. Personal review of EKG from today shows:  ***        TTE, 12/24/2023  1. Left ventricular ejection fraction, by estimation, is 60 to 65%. The  left ventricle has normal function. The left ventricle has no regional  wall motion abnormalities. The left ventricular internal cavity size was  moderately to severely dilated. There   is mild left ventricular hypertrophy. Left ventricular diastolic  parameters were normal.   2. Right ventricular systolic function is normal. The right ventricular  size is normal. Tricuspid regurgitation signal is inadequate for assessing  PA pressure.   3. The mitral valve is grossly normal. Trivial mitral valve  regurgitation. No evidence of mitral stenosis. Moderate mitral annular  calcification.   4. The aortic valve was not well visualized. There is moderate  calcification of the aortic valve. Aortic valve regurgitation is trivial.  Aortic valve sclerosis is present, with no evidence of aortic valve  stenosis.   5. The inferior vena cava is normal in size with greater than 50%  respiratory variability, suggesting right atrial pressure of 3 mmHg.   Comparison(s): No significant change from prior study.  Myocardial spect, 01/14/2023   Low risk, probably normal pharmacologic myocardial perfusion stress test.   There is a small in size, moderate in severity, fixed apical defect most likely representing artifact but cannot exclude infarct.   No significant ischemia is identified.   Left ventricular systolic function is normal (LVEF 55-65%).   Coronary artery calcification and aortic atherosclerosis are noted on the attenuation correction CT.   Collapse of the right  middle lobe again noted, better evaluated on CTA chest, abdomen, and pelvis from 10/05/2022.   Assessment and Plan     #) high grade HB s/p PPM ***   #) ***   {Are you ordering a CV Procedure (e.g. stress test, cath, DCCV, TEE, etc)?   Press F2        :[id number]}   Current medicines are reviewed at length with the patient today.   The patient {ACTIONS; HAS/DOES NOT HAVE:19233} concerns regarding her medicines.  The following changes were made today:  {NONE DEFAULTED:18576}  Labs/ tests ordered today include: *** No orders of the defined types were placed in this encounter.    Disposition: Follow up with {EPMDS:28135::EP Team} or EP APP {EPFOLLOW UP:28173}   Signed, Kemberly Taves, NP  03/31/24  7:12 PM  Electrophysiology [facility] "

## 2024-04-01 ENCOUNTER — Ambulatory Visit: Admitting: Cardiology

## 2024-04-01 DIAGNOSIS — I442 Atrioventricular block, complete: Secondary | ICD-10-CM

## 2024-04-01 DIAGNOSIS — Z950 Presence of cardiac pacemaker: Secondary | ICD-10-CM

## 2024-04-06 ENCOUNTER — Ambulatory Visit: Admitting: Endocrinology

## 2024-04-07 ENCOUNTER — Ambulatory Visit: Admitting: Cardiology

## 2024-04-07 ENCOUNTER — Encounter: Payer: Self-pay | Admitting: Cardiology

## 2024-04-07 VITALS — BP 150/70 | HR 85 | Ht 61.5 in | Wt 166.0 lb

## 2024-04-07 DIAGNOSIS — Z950 Presence of cardiac pacemaker: Secondary | ICD-10-CM | POA: Diagnosis not present

## 2024-04-07 DIAGNOSIS — I442 Atrioventricular block, complete: Secondary | ICD-10-CM | POA: Diagnosis not present

## 2024-04-07 LAB — CUP PACEART INCLINIC DEVICE CHECK
Date Time Interrogation Session: 20260204163316
Implantable Lead Connection Status: 753985
Implantable Lead Connection Status: 753985
Implantable Lead Implant Date: 20251022
Implantable Lead Implant Date: 20251022
Implantable Lead Location: 753859
Implantable Lead Location: 753860
Implantable Lead Model: 3830
Implantable Lead Model: 5076
Implantable Pulse Generator Implant Date: 20251022

## 2024-04-07 NOTE — Patient Instructions (Signed)
 Medication Instructions:  Your physician recommends that you continue on your current medications as directed. Please refer to the Current Medication list given to you today.  *If you need a refill on your cardiac medications before your next appointment, please call your pharmacy*  Lab Work: No labs ordered today    Testing/Procedures: No test ordered today   Follow-Up: At [facility], you and your health needs are our priority.  As part of our continuing mission to provide you with exceptional heart care, our providers are all part of one team.  This team includes your primary Cardiologist (physician) and Advanced Practice Providers or APPs (Physician Assistants and Nurse Practitioners) who all work together to provide you with the care you need, when you need it.  Your next appointment:   5 month(s)  Provider:   Suzann Riddle, NP

## 2024-04-09 ENCOUNTER — Ambulatory Visit: Admitting: Physician Assistant

## 2024-04-28 ENCOUNTER — Institutional Professional Consult (permissible substitution): Admitting: Psychology

## 2024-04-28 ENCOUNTER — Ambulatory Visit: Payer: Self-pay

## 2024-05-05 ENCOUNTER — Encounter: Admitting: Psychology

## 2024-05-05 ENCOUNTER — Ambulatory Visit

## 2024-07-12 ENCOUNTER — Ambulatory Visit: Admitting: Endocrinology

## 2024-08-04 ENCOUNTER — Ambulatory Visit

## 2024-08-20 ENCOUNTER — Ambulatory Visit: Payer: Self-pay | Admitting: Physician Assistant

## 2024-08-31 ENCOUNTER — Ambulatory Visit: Admitting: Cardiology

## 2024-11-03 ENCOUNTER — Ambulatory Visit

## 2025-02-02 ENCOUNTER — Ambulatory Visit

## 2025-05-04 ENCOUNTER — Ambulatory Visit
